# Patient Record
Sex: Female | Born: 1950 | Race: White | Hispanic: No | Marital: Married | Smoking: Current every day smoker
Health system: Southern US, Community
[De-identification: ages and names within clinical notes are randomized; demographics above are authoritative.]

## PROBLEM LIST (undated history)

## (undated) DIAGNOSIS — I2510 Atherosclerotic heart disease of native coronary artery without angina pectoris: Secondary | ICD-10-CM

## (undated) DIAGNOSIS — M549 Dorsalgia, unspecified: Secondary | ICD-10-CM

## (undated) DIAGNOSIS — F329 Major depressive disorder, single episode, unspecified: Secondary | ICD-10-CM

## (undated) DIAGNOSIS — F32A Depression, unspecified: Secondary | ICD-10-CM

## (undated) DIAGNOSIS — F419 Anxiety disorder, unspecified: Secondary | ICD-10-CM

## (undated) DIAGNOSIS — I219 Acute myocardial infarction, unspecified: Secondary | ICD-10-CM

## (undated) DIAGNOSIS — J449 Chronic obstructive pulmonary disease, unspecified: Secondary | ICD-10-CM

## (undated) DIAGNOSIS — E119 Type 2 diabetes mellitus without complications: Secondary | ICD-10-CM

## (undated) DIAGNOSIS — Z720 Tobacco use: Secondary | ICD-10-CM

## (undated) DIAGNOSIS — R112 Nausea with vomiting, unspecified: Secondary | ICD-10-CM

## (undated) DIAGNOSIS — E039 Hypothyroidism, unspecified: Secondary | ICD-10-CM

## (undated) DIAGNOSIS — I10 Essential (primary) hypertension: Secondary | ICD-10-CM

## (undated) DIAGNOSIS — T8859XA Other complications of anesthesia, initial encounter: Secondary | ICD-10-CM

## (undated) DIAGNOSIS — G8929 Other chronic pain: Secondary | ICD-10-CM

## (undated) DIAGNOSIS — M1990 Unspecified osteoarthritis, unspecified site: Secondary | ICD-10-CM

## (undated) DIAGNOSIS — T4145XA Adverse effect of unspecified anesthetic, initial encounter: Secondary | ICD-10-CM

## (undated) DIAGNOSIS — Z9889 Other specified postprocedural states: Secondary | ICD-10-CM

## (undated) HISTORY — PX: TONSILLECTOMY: SUR1361

## (undated) HISTORY — PX: SPINAL FUSION: SHX223

## (undated) HISTORY — PX: TUBAL LIGATION: SHX77

## (undated) HISTORY — PX: ORIF ANKLE FRACTURE: SHX5408

## (undated) HISTORY — PX: EXPLORATORY LAPAROTOMY: SUR591

## (undated) HISTORY — PX: APPENDECTOMY: SHX54

## (undated) HISTORY — PX: BACK SURGERY: SHX140

## (undated) HISTORY — PX: BREAST LUMPECTOMY: SHX2

## (undated) HISTORY — PX: ABDOMINAL HYSTERECTOMY: SHX81

## (undated) HISTORY — PX: CHOLECYSTECTOMY: SHX55

---

## 2012-07-08 ENCOUNTER — Encounter (INDEPENDENT_AMBULATORY_CARE_PROVIDER_SITE_OTHER): Payer: Self-pay | Admitting: *Deleted

## 2013-01-22 ENCOUNTER — Ambulatory Visit (HOSPITAL_COMMUNITY): Payer: BC Managed Care – PPO | Admitting: Anesthesiology

## 2013-01-22 ENCOUNTER — Inpatient Hospital Stay (HOSPITAL_COMMUNITY): Payer: BC Managed Care – PPO

## 2013-01-22 ENCOUNTER — Encounter (HOSPITAL_COMMUNITY): Payer: Self-pay | Admitting: Cardiology

## 2013-01-22 ENCOUNTER — Encounter (HOSPITAL_COMMUNITY): Payer: BC Managed Care – PPO | Admitting: Anesthesiology

## 2013-01-22 ENCOUNTER — Encounter (HOSPITAL_COMMUNITY)
Admission: EM | Disposition: A | Discharge status: Home / Self Care | Payer: Self-pay | Source: Other Acute Inpatient Hospital | Attending: Cardiothoracic Surgery

## 2013-01-22 ENCOUNTER — Inpatient Hospital Stay (HOSPITAL_COMMUNITY)
Admission: EM | Admit: 2013-01-22 | Discharge: 2013-01-29 | DRG: 233 | Disposition: A | Discharge status: Home / Self Care | Payer: BC Managed Care – PPO | Source: Other Acute Inpatient Hospital | Attending: Cardiothoracic Surgery | Admitting: Cardiothoracic Surgery

## 2013-01-22 ENCOUNTER — Encounter (HOSPITAL_COMMUNITY)
Admission: EM | Disposition: A | Discharge status: Home / Self Care | Payer: BC Managed Care – PPO | Source: Other Acute Inpatient Hospital | Attending: Cardiothoracic Surgery

## 2013-01-22 DIAGNOSIS — F17200 Nicotine dependence, unspecified, uncomplicated: Secondary | ICD-10-CM

## 2013-01-22 DIAGNOSIS — I255 Ischemic cardiomyopathy: Secondary | ICD-10-CM | POA: Diagnosis present

## 2013-01-22 DIAGNOSIS — I2510 Atherosclerotic heart disease of native coronary artery without angina pectoris: Secondary | ICD-10-CM

## 2013-01-22 DIAGNOSIS — I10 Essential (primary) hypertension: Secondary | ICD-10-CM | POA: Diagnosis present

## 2013-01-22 DIAGNOSIS — E785 Hyperlipidemia, unspecified: Secondary | ICD-10-CM | POA: Diagnosis present

## 2013-01-22 DIAGNOSIS — E119 Type 2 diabetes mellitus without complications: Secondary | ICD-10-CM | POA: Diagnosis present

## 2013-01-22 DIAGNOSIS — D696 Thrombocytopenia, unspecified: Secondary | ICD-10-CM | POA: Diagnosis not present

## 2013-01-22 DIAGNOSIS — I2589 Other forms of chronic ischemic heart disease: Secondary | ICD-10-CM | POA: Diagnosis present

## 2013-01-22 DIAGNOSIS — Z720 Tobacco use: Secondary | ICD-10-CM | POA: Diagnosis present

## 2013-01-22 DIAGNOSIS — J449 Chronic obstructive pulmonary disease, unspecified: Secondary | ICD-10-CM | POA: Diagnosis present

## 2013-01-22 DIAGNOSIS — I213 ST elevation (STEMI) myocardial infarction of unspecified site: Secondary | ICD-10-CM

## 2013-01-22 DIAGNOSIS — R578 Other shock: Secondary | ICD-10-CM | POA: Diagnosis not present

## 2013-01-22 DIAGNOSIS — I2109 ST elevation (STEMI) myocardial infarction involving other coronary artery of anterior wall: Principal | ICD-10-CM | POA: Diagnosis present

## 2013-01-22 DIAGNOSIS — I2582 Chronic total occlusion of coronary artery: Secondary | ICD-10-CM | POA: Diagnosis present

## 2013-01-22 DIAGNOSIS — E8779 Other fluid overload: Secondary | ICD-10-CM | POA: Diagnosis not present

## 2013-01-22 DIAGNOSIS — I2119 ST elevation (STEMI) myocardial infarction involving other coronary artery of inferior wall: Secondary | ICD-10-CM

## 2013-01-22 DIAGNOSIS — D62 Acute posthemorrhagic anemia: Secondary | ICD-10-CM | POA: Diagnosis not present

## 2013-01-22 DIAGNOSIS — I219 Acute myocardial infarction, unspecified: Secondary | ICD-10-CM

## 2013-01-22 DIAGNOSIS — Z951 Presence of aortocoronary bypass graft: Secondary | ICD-10-CM

## 2013-01-22 DIAGNOSIS — K5900 Constipation, unspecified: Secondary | ICD-10-CM | POA: Diagnosis not present

## 2013-01-22 DIAGNOSIS — R579 Shock, unspecified: Secondary | ICD-10-CM

## 2013-01-22 DIAGNOSIS — Z79899 Other long term (current) drug therapy: Secondary | ICD-10-CM

## 2013-01-22 DIAGNOSIS — M549 Dorsalgia, unspecified: Secondary | ICD-10-CM

## 2013-01-22 DIAGNOSIS — I739 Peripheral vascular disease, unspecified: Secondary | ICD-10-CM

## 2013-01-22 DIAGNOSIS — J4489 Other specified chronic obstructive pulmonary disease: Secondary | ICD-10-CM | POA: Diagnosis present

## 2013-01-22 HISTORY — PX: CORONARY ARTERY BYPASS GRAFT: SHX141

## 2013-01-22 HISTORY — DX: Anxiety disorder, unspecified: F41.9

## 2013-01-22 HISTORY — DX: Major depressive disorder, single episode, unspecified: F32.9

## 2013-01-22 HISTORY — DX: Acute myocardial infarction, unspecified: I21.9

## 2013-01-22 HISTORY — DX: Depression, unspecified: F32.A

## 2013-01-22 HISTORY — DX: Adverse effect of unspecified anesthetic, initial encounter: T41.45XA

## 2013-01-22 HISTORY — PX: LEFT HEART CATHETERIZATION WITH CORONARY ANGIOGRAM: SHX5451

## 2013-01-22 HISTORY — DX: Other complications of anesthesia, initial encounter: T88.59XA

## 2013-01-22 LAB — POCT I-STAT 3, ART BLOOD GAS (G3+)
Acid-base deficit: 3 mmol/L — ABNORMAL HIGH (ref 0.0–2.0)
Bicarbonate: 20.9 mEq/L (ref 20.0–24.0)
Bicarbonate: 20.6 mEq/L (ref 20.0–24.0)
O2 Saturation: 100 %
O2 Saturation: 97 %
TCO2: 28 mmol/L (ref 0–100)
TCO2: 22 mmol/L (ref 0–100)
TCO2: 22 mmol/L (ref 0–100)
pCO2 arterial: 37.4 mmHg (ref 35.0–45.0)
pCO2 arterial: 50.8 mmHg — ABNORMAL HIGH (ref 35.0–45.0)
pCO2 arterial: 39.3 mmHg (ref 35.0–45.0)
pH, Arterial: 7.356 (ref 7.350–7.450)
pH, Arterial: 7.283 — ABNORMAL LOW (ref 7.350–7.450)
pO2, Arterial: 481 mmHg — ABNORMAL HIGH (ref 80.0–100.0)
pO2, Arterial: 326 mmHg — ABNORMAL HIGH (ref 80.0–100.0)

## 2013-01-22 LAB — POCT I-STAT 4, (NA,K, GLUC, HGB,HCT)
Glucose, Bld: 168 mg/dL — ABNORMAL HIGH (ref 70–99)
Glucose, Bld: 150 mg/dL — ABNORMAL HIGH (ref 70–99)
Glucose, Bld: 102 mg/dL — ABNORMAL HIGH (ref 70–99)
Glucose, Bld: 158 mg/dL — ABNORMAL HIGH (ref 70–99)
HCT: 43 % (ref 36.0–46.0)
HCT: 33 % — ABNORMAL LOW (ref 36.0–46.0)
HCT: 21 % — ABNORMAL LOW (ref 36.0–46.0)
HCT: 26 % — ABNORMAL LOW (ref 36.0–46.0)
HCT: 27 % — ABNORMAL LOW (ref 36.0–46.0)
HCT: 36 % (ref 36.0–46.0)
Hemoglobin: 14.6 g/dL (ref 12.0–15.0)
Hemoglobin: 7.1 g/dL — ABNORMAL LOW (ref 12.0–15.0)
Hemoglobin: 8.8 g/dL — ABNORMAL LOW (ref 12.0–15.0)
Hemoglobin: 8.8 g/dL — ABNORMAL LOW (ref 12.0–15.0)
Hemoglobin: 9.2 g/dL — ABNORMAL LOW (ref 12.0–15.0)
Hemoglobin: 12.2 g/dL (ref 12.0–15.0)
Potassium: 4.2 mEq/L (ref 3.5–5.1)
Potassium: 3.5 mEq/L (ref 3.5–5.1)
Potassium: 4.3 mEq/L (ref 3.5–5.1)
Potassium: 5 mEq/L (ref 3.5–5.1)
Potassium: 3.3 mEq/L — ABNORMAL LOW (ref 3.5–5.1)
Sodium: 137 mEq/L (ref 135–145)
Sodium: 139 mEq/L (ref 135–145)
Sodium: 133 mEq/L — ABNORMAL LOW (ref 135–145)
Sodium: 139 mEq/L (ref 135–145)

## 2013-01-22 LAB — COMPREHENSIVE METABOLIC PANEL
ALT: 9 U/L (ref 0–35)
AST: 15 U/L (ref 0–37)
Albumin: 2.9 g/dL — ABNORMAL LOW (ref 3.5–5.2)
Alkaline Phosphatase: 69 U/L (ref 39–117)
Calcium: 7.7 mg/dL — ABNORMAL LOW (ref 8.4–10.5)
GFR calc Af Amer: >90 mL/min (ref >90)
Glucose, Bld: 200 mg/dL — ABNORMAL HIGH (ref 70–99)
Potassium: 3.9 mEq/L (ref 3.5–5.1)
Sodium: 130 mEq/L — ABNORMAL LOW (ref 135–145)
Total Protein: 5.7 g/dL — ABNORMAL LOW (ref 6.0–8.3)

## 2013-01-22 LAB — TROPONIN I: Troponin I: 1.62 ng/mL (ref <0.30)

## 2013-01-22 LAB — CBC
HCT: 36 % (ref 36.0–46.0)
Hemoglobin: 12.7 g/dL (ref 12.0–15.0)
MCH: 30.5 pg (ref 26.0–34.0)
MCH: 29.9 pg (ref 26.0–34.0)
MCHC: 33.7 g/dL (ref 30.0–36.0)
MCHC: 33.9 g/dL (ref 30.0–36.0)
MCV: 88.2 fL (ref 78.0–100.0)
Platelets: 246 10*3/uL (ref 150–400)
RBC: 4.08 MIL/uL (ref 3.87–5.11)
RDW: 14.4 % (ref 11.5–15.5)

## 2013-01-22 LAB — LIPID PANEL
Cholesterol: 273 mg/dL — ABNORMAL HIGH (ref 0–200)
HDL: 32 mg/dL — ABNORMAL LOW (ref >39)
LDL Cholesterol: 202 mg/dL — ABNORMAL HIGH (ref 0–99)
Total CHOL/HDL Ratio: 8.5 RATIO
Triglycerides: 196 mg/dL — ABNORMAL HIGH (ref <150)
VLDL: 39 mg/dL (ref 0–40)

## 2013-01-22 LAB — CK TOTAL AND CKMB (NOT AT ARMC)
CK, MB: 11 ng/mL (ref 0.3–4.0)
Relative Index: 10.9 — ABNORMAL HIGH (ref 0.0–2.5)

## 2013-01-22 LAB — GLUCOSE, CAPILLARY: Glucose-Capillary: 196 mg/dL — ABNORMAL HIGH (ref 70–99)

## 2013-01-22 LAB — PLATELET COUNT: Platelets: 106 10*3/uL — ABNORMAL LOW (ref 150–400)

## 2013-01-22 LAB — HEMOGLOBIN AND HEMATOCRIT, BLOOD
HCT: 24.6 % — ABNORMAL LOW (ref 36.0–46.0)
Hemoglobin: 8.6 g/dL — ABNORMAL LOW (ref 12.0–15.0)

## 2013-01-22 LAB — ABO/RH: ABO/RH(D): O POS

## 2013-01-22 SURGERY — CORONARY ARTERY BYPASS GRAFTING (CABG)
Anesthesia: General | Site: Chest

## 2013-01-22 SURGERY — LEFT HEART CATHETERIZATION WITH CORONARY ANGIOGRAM
Laterality: Bilateral

## 2013-01-22 MED ORDER — EPINEPHRINE HCL 1 MG/ML IJ SOLN
0.5000 ug/min | INTRAMUSCULAR | Status: DC
  Filled 2013-01-22: qty 4

## 2013-01-22 MED ORDER — PLASMA-LYTE 148 IV SOLN
INTRAVENOUS | Status: DC
  Filled 2013-01-22: qty 2.5

## 2013-01-22 MED ORDER — DOPAMINE-DEXTROSE 3.2-5 MG/ML-% IV SOLN: 2.0000 ug/kg/min | INTRAVENOUS | Status: DC

## 2013-01-22 MED ORDER — DEXMEDETOMIDINE HCL IN NACL 400 MCG/100ML IV SOLN
0.1000 ug/kg/h | INTRAVENOUS | Status: DC
  Filled 2013-01-22: qty 100

## 2013-01-22 MED ORDER — MAGNESIUM SULFATE 40 MG/ML IJ SOLN
4.0000 g | Freq: Once | INTRAMUSCULAR | Status: AC
  Administered 2013-01-22: 4 g via INTRAVENOUS
  Filled 2013-01-22: qty 100

## 2013-01-22 MED ORDER — SODIUM CHLORIDE 0.9 % IV SOLN
10.0000 g | INTRAVENOUS | Status: DC | PRN
  Administered 2013-01-22: 5 g/h via INTRAVENOUS

## 2013-01-22 MED ORDER — PHENYLEPHRINE HCL 10 MG/ML IJ SOLN
30.0000 ug/min | INTRAVENOUS | Status: DC
  Administered 2013-01-22: 50 ug/min via INTRAVENOUS
  Filled 2013-01-22: qty 2

## 2013-01-22 MED ORDER — SODIUM CHLORIDE 0.9 % IV SOLN
INTRAVENOUS | Status: DC
  Filled 2013-01-22: qty 1

## 2013-01-22 MED ORDER — METOPROLOL TARTRATE 12.5 MG HALF TABLET: 12.5000 mg | ORAL_TABLET | Freq: Once | ORAL | Status: DC

## 2013-01-22 MED ORDER — SUCCINYLCHOLINE CHLORIDE 20 MG/ML IJ SOLN
INTRAMUSCULAR | Status: DC | PRN
  Administered 2013-01-22: 100 mg via INTRAVENOUS

## 2013-01-22 MED ORDER — FENTANYL CITRATE 0.05 MG/ML IJ SOLN
INTRAMUSCULAR | Status: DC | PRN
  Administered 2013-01-22 (×2): 250 ug via INTRAVENOUS
  Administered 2013-01-22: 200 ug via INTRAVENOUS
  Administered 2013-01-22: 250 ug via INTRAVENOUS
  Administered 2013-01-22: 50 ug via INTRAVENOUS

## 2013-01-22 MED ORDER — ACETAMINOPHEN 160 MG/5ML PO SOLN
1000.0000 mg | Freq: Four times a day (QID) | ORAL | Status: DC
  Administered 2013-01-23 – 2013-01-24 (×4): 1000 mg
  Filled 2013-01-22 (×3): qty 40.6

## 2013-01-22 MED ORDER — NITROGLYCERIN IN D5W 200-5 MCG/ML-% IV SOLN
0.0000 ug/min | INTRAVENOUS | Status: DC
  Administered 2013-01-22: 5 ug/min via INTRAVENOUS

## 2013-01-22 MED ORDER — PROTAMINE SULFATE 10 MG/ML IV SOLN
INTRAVENOUS | Status: DC | PRN
  Administered 2013-01-22: 290 mg via INTRAVENOUS
  Administered 2013-01-22: 10 mg via INTRAVENOUS

## 2013-01-22 MED ORDER — POTASSIUM CHLORIDE 10 MEQ/50ML IV SOLN
10.0000 meq | INTRAVENOUS | Status: AC
  Administered 2013-01-22 – 2013-01-23 (×3): 10 meq via INTRAVENOUS

## 2013-01-22 MED ORDER — PHENYLEPHRINE HCL 10 MG/ML IJ SOLN
30.0000 ug/min | INTRAVENOUS | Status: DC
  Filled 2013-01-22: qty 2

## 2013-01-22 MED ORDER — SODIUM CHLORIDE 0.9 % IV SOLN
INTRAVENOUS | Status: DC
  Filled 2013-01-22: qty 30

## 2013-01-22 MED ORDER — MAGNESIUM SULFATE 50 % IJ SOLN
40.0000 meq | INTRAMUSCULAR | Status: DC
  Filled 2013-01-22: qty 10

## 2013-01-22 MED ORDER — ONDANSETRON HCL 4 MG/2ML IJ SOLN
4.0000 mg | Freq: Four times a day (QID) | INTRAMUSCULAR | Status: DC | PRN
  Administered 2013-01-24 (×2): 4 mg via INTRAVENOUS
  Filled 2013-01-22 (×3): qty 2

## 2013-01-22 MED ORDER — DEXMEDETOMIDINE HCL IN NACL 200 MCG/50ML IV SOLN
0.1000 ug/kg/h | INTRAVENOUS | Status: DC
  Administered 2013-01-22 – 2013-01-24 (×8): 0.7 ug/kg/h via INTRAVENOUS
  Administered 2013-01-24: 0.5 ug/kg/h via INTRAVENOUS
  Filled 2013-01-22 (×8): qty 50

## 2013-01-22 MED ORDER — PANTOPRAZOLE SODIUM 40 MG PO TBEC
40.0000 mg | DELAYED_RELEASE_TABLET | Freq: Every day | ORAL | Status: DC
  Administered 2013-01-24 – 2013-01-27 (×4): 40 mg via ORAL
  Filled 2013-01-22 (×4): qty 1

## 2013-01-22 MED ORDER — SODIUM CHLORIDE 0.9 % IV SOLN
INTRAVENOUS | Status: DC
  Administered 2013-01-22: 23:00:00 via INTRAVENOUS

## 2013-01-22 MED ORDER — BISACODYL 5 MG PO TBEC: 5.0000 mg | DELAYED_RELEASE_TABLET | Freq: Once | ORAL | Status: DC

## 2013-01-22 MED ORDER — VANCOMYCIN HCL 10 G IV SOLR
1250.0000 mg | INTRAVENOUS | Status: DC
  Filled 2013-01-22: qty 1250

## 2013-01-22 MED ORDER — ROCURONIUM BROMIDE 100 MG/10ML IV SOLN
INTRAVENOUS | Status: DC | PRN
  Administered 2013-01-22: 50 mg via INTRAVENOUS

## 2013-01-22 MED ORDER — SODIUM CHLORIDE 0.9 % IV SOLN
INTRAVENOUS | Status: DC
  Filled 2013-01-22: qty 40

## 2013-01-22 MED ORDER — PHENYLEPHRINE HCL 10 MG/ML IJ SOLN
0.0000 ug/min | INTRAMUSCULAR | Status: DC
  Administered 2013-01-22: 50 ug/min via INTRAVENOUS
  Administered 2013-01-23: 45 ug/min via INTRAVENOUS
  Administered 2013-01-23: 55 ug/min via INTRAVENOUS
  Filled 2013-01-22 (×5): qty 2

## 2013-01-22 MED ORDER — SODIUM CHLORIDE 0.9 % IJ SOLN
3.0000 mL | Freq: Two times a day (BID) | INTRAMUSCULAR | Status: DC
  Administered 2013-01-23: 10:00:00 via INTRAVENOUS
  Administered 2013-01-23 – 2013-01-27 (×8): 3 mL via INTRAVENOUS

## 2013-01-22 MED ORDER — ETOMIDATE 2 MG/ML IV SOLN
INTRAVENOUS | Status: DC | PRN
  Administered 2013-01-22: 12 mg via INTRAVENOUS

## 2013-01-22 MED ORDER — DEXTROSE 5 % IV SOLN
1.5000 g | INTRAVENOUS | Status: DC
  Filled 2013-01-22: qty 1.5

## 2013-01-22 MED ORDER — SODIUM CHLORIDE 0.9 % IJ SOLN
OROMUCOSAL | Status: DC | PRN
  Administered 2013-01-22 (×3): via TOPICAL

## 2013-01-22 MED ORDER — INSULIN REGULAR BOLUS VIA INFUSION
0.0000 [IU] | Freq: Three times a day (TID) | INTRAVENOUS | Status: DC
  Filled 2013-01-22: qty 10

## 2013-01-22 MED ORDER — SODIUM CHLORIDE 0.9 % IV SOLN: 250.0000 mL | INTRAVENOUS | Status: DC

## 2013-01-22 MED ORDER — ALBUMIN HUMAN 5 % IV SOLN
250.0000 mL | INTRAVENOUS | Status: AC | PRN
  Administered 2013-01-22 – 2013-01-23 (×2): 250 mL via INTRAVENOUS

## 2013-01-22 MED ORDER — LACTATED RINGERS IV SOLN
INTRAVENOUS | Status: DC | PRN
  Administered 2013-01-22: 14:00:00 via INTRAVENOUS

## 2013-01-22 MED ORDER — NITROGLYCERIN IN D5W 200-5 MCG/ML-% IV SOLN
2.0000 ug/min | INTRAVENOUS | Status: DC
  Administered 2013-01-22: 5 ug/min via INTRAVENOUS

## 2013-01-22 MED ORDER — 0.9 % SODIUM CHLORIDE (POUR BTL) OPTIME
TOPICAL | Status: DC | PRN
  Administered 2013-01-22: 6000 mL

## 2013-01-22 MED ORDER — MORPHINE SULFATE 2 MG/ML IJ SOLN
1.0000 mg | INTRAMUSCULAR | Status: AC | PRN
  Administered 2013-01-23 (×2): 2 mg via INTRAVENOUS
  Filled 2013-01-22: qty 2

## 2013-01-22 MED ORDER — LIDOCAINE HCL (CARDIAC) 20 MG/ML IV SOLN
INTRAVENOUS | Status: DC | PRN
  Administered 2013-01-22: 40 mg via INTRAVENOUS

## 2013-01-22 MED ORDER — POTASSIUM CHLORIDE 2 MEQ/ML IV SOLN
80.0000 meq | INTRAVENOUS | Status: DC
  Filled 2013-01-22: qty 40

## 2013-01-22 MED ORDER — ALBUTEROL SULFATE HFA 108 (90 BASE) MCG/ACT IN AERS
INHALATION_SPRAY | RESPIRATORY_TRACT | Status: DC | PRN
  Administered 2013-01-22: 2 via RESPIRATORY_TRACT

## 2013-01-22 MED ORDER — METOPROLOL TARTRATE 12.5 MG HALF TABLET
12.5000 mg | ORAL_TABLET | Freq: Two times a day (BID) | ORAL | Status: DC
  Administered 2013-01-24: 12.5 mg via ORAL
  Filled 2013-01-22 (×5): qty 1

## 2013-01-22 MED ORDER — LACTATED RINGERS IV SOLN
INTRAVENOUS | Status: DC
  Administered 2013-01-22: 20 mL/h via INTRAVENOUS
  Administered 2013-01-25: via INTRAVENOUS

## 2013-01-22 MED ORDER — MILRINONE IN DEXTROSE 20 MG/100ML IV SOLN
0.3000 ug/kg/min | INTRAVENOUS | Status: DC
Start: 2013-01-22 — End: 2013-01-22

## 2013-01-22 MED ORDER — METOPROLOL TARTRATE 25 MG/10 ML ORAL SUSPENSION
12.5000 mg | Freq: Two times a day (BID) | ORAL | Status: DC
  Filled 2013-01-22 (×5): qty 5

## 2013-01-22 MED ORDER — FAMOTIDINE IN NACL 20-0.9 MG/50ML-% IV SOLN
20.0000 mg | Freq: Two times a day (BID) | INTRAVENOUS | Status: AC
  Administered 2013-01-22 – 2013-01-23 (×2): 20 mg via INTRAVENOUS
  Filled 2013-01-22: qty 50

## 2013-01-22 MED ORDER — ACETAMINOPHEN 650 MG RE SUPP: 650.0000 mg | Freq: Once | RECTAL | Status: AC

## 2013-01-22 MED ORDER — SODIUM CHLORIDE 0.9 % IV SOLN
INTRAVENOUS | Status: DC | PRN
  Administered 2013-01-22: 17:00:00 via INTRAVENOUS

## 2013-01-22 MED ORDER — ARTIFICIAL TEARS OP OINT
TOPICAL_OINTMENT | OPHTHALMIC | Status: DC | PRN
  Administered 2013-01-22: 1 via OPHTHALMIC

## 2013-01-22 MED ORDER — EPINEPHRINE HCL 1 MG/ML IJ SOLN
0.5000 ug/min | INTRAVENOUS | Status: DC
  Filled 2013-01-22: qty 4

## 2013-01-22 MED ORDER — DOPAMINE-DEXTROSE 3.2-5 MG/ML-% IV SOLN
2.5000 ug/kg/min | INTRAVENOUS | Status: DC
  Administered 2013-01-22: 5 ug/kg/min via INTRAVENOUS
  Administered 2013-01-24: 4 ug/kg/min via INTRAVENOUS
  Filled 2013-01-22: qty 250

## 2013-01-22 MED ORDER — DOPAMINE-DEXTROSE 3.2-5 MG/ML-% IV SOLN
2.0000 ug/kg/min | INTRAVENOUS | Status: DC
  Administered 2013-01-22: 3 ug/kg/min via INTRAVENOUS
  Filled 2013-01-22: qty 250

## 2013-01-22 MED ORDER — HEMOSTATIC AGENTS (NO CHARGE) OPTIME
TOPICAL | Status: DC | PRN
  Administered 2013-01-22: 1 via TOPICAL

## 2013-01-22 MED ORDER — ASPIRIN 81 MG PO CHEW
324.0000 mg | CHEWABLE_TABLET | Freq: Every day | ORAL | Status: DC
  Administered 2013-01-23: 324 mg
  Filled 2013-01-22: qty 4

## 2013-01-22 MED ORDER — LACTATED RINGERS IV SOLN
INTRAVENOUS | Status: DC | PRN
  Administered 2013-01-22 (×2): via INTRAVENOUS

## 2013-01-22 MED ORDER — SODIUM CHLORIDE 0.9 % IV SOLN
INTRAVENOUS | Status: DC
  Administered 2013-01-22: 3.2 [IU]/h via INTRAVENOUS
  Filled 2013-01-22: qty 1

## 2013-01-22 MED ORDER — DEXTROSE 5 % IV SOLN
750.0000 mg | INTRAVENOUS | Status: DC
  Filled 2013-01-22 (×2): qty 750

## 2013-01-22 MED ORDER — LACTATED RINGERS IV SOLN: 500.0000 mL | Freq: Once | INTRAVENOUS | Status: AC | PRN

## 2013-01-22 MED ORDER — EPHEDRINE SULFATE 50 MG/ML IJ SOLN
INTRAMUSCULAR | Status: DC | PRN
  Administered 2013-01-22: 10 mg via INTRAVENOUS

## 2013-01-22 MED ORDER — TEMAZEPAM 15 MG PO CAPS: 15.0000 mg | ORAL_CAPSULE | Freq: Once | ORAL | Status: DC | PRN

## 2013-01-22 MED ORDER — NITROGLYCERIN IN D5W 200-5 MCG/ML-% IV SOLN
2.0000 ug/min | INTRAVENOUS | Status: DC
  Filled 2013-01-22: qty 250

## 2013-01-22 MED ORDER — PLASMA-LYTE 148 IV SOLN
INTRAVENOUS | Status: DC | PRN
  Administered 2013-01-22: 16:00:00 via INTRAVASCULAR

## 2013-01-22 MED ORDER — VECURONIUM BROMIDE 10 MG IV SOLR
INTRAVENOUS | Status: DC | PRN
  Administered 2013-01-22 (×3): 5 mg via INTRAVENOUS

## 2013-01-22 MED ORDER — LEVALBUTEROL HCL 0.63 MG/3ML IN NEBU
0.6300 mg | INHALATION_SOLUTION | Freq: Four times a day (QID) | RESPIRATORY_TRACT | Status: DC
  Administered 2013-01-22 – 2013-01-25 (×13): 0.63 mg via RESPIRATORY_TRACT
  Filled 2013-01-22 (×20): qty 3

## 2013-01-22 MED ORDER — DOCUSATE SODIUM 100 MG PO CAPS
200.0000 mg | ORAL_CAPSULE | Freq: Every day | ORAL | Status: DC
  Administered 2013-01-24 – 2013-01-27 (×4): 200 mg via ORAL
  Filled 2013-01-22 (×4): qty 2

## 2013-01-22 MED ORDER — HEPARIN SODIUM (PORCINE) 1000 UNIT/ML IJ SOLN
INTRAMUSCULAR | Status: DC | PRN
  Administered 2013-01-22: 20000 [IU] via INTRAVENOUS

## 2013-01-22 MED ORDER — MILRINONE IN DEXTROSE 20 MG/100ML IV SOLN
0.1250 ug/kg/min | INTRAVENOUS | Status: AC
  Administered 2013-01-22 – 2013-01-23 (×2): 0.3 ug/kg/min via INTRAVENOUS
  Filled 2013-01-22: qty 100

## 2013-01-22 MED ORDER — MORPHINE SULFATE 2 MG/ML IJ SOLN
2.0000 mg | INTRAMUSCULAR | Status: DC | PRN
  Administered 2013-01-23: 4 mg via INTRAVENOUS
  Administered 2013-01-23: 2 mg via INTRAVENOUS
  Administered 2013-01-23 (×2): 4 mg via INTRAVENOUS
  Administered 2013-01-23 (×2): 2 mg via INTRAVENOUS
  Administered 2013-01-23 (×2): 4 mg via INTRAVENOUS
  Administered 2013-01-23 – 2013-01-24 (×3): 2 mg via INTRAVENOUS
  Administered 2013-01-24: 4 mg via INTRAVENOUS
  Administered 2013-01-24 (×2): 2 mg via INTRAVENOUS
  Administered 2013-01-24: 4 mg via INTRAVENOUS
  Filled 2013-01-22 (×6): qty 2
  Filled 2013-01-22: qty 1
  Filled 2013-01-22: qty 2
  Filled 2013-01-22 (×2): qty 1
  Filled 2013-01-22 (×2): qty 2
  Filled 2013-01-22 (×2): qty 1

## 2013-01-22 MED ORDER — BISACODYL 5 MG PO TBEC
10.0000 mg | DELAYED_RELEASE_TABLET | Freq: Every day | ORAL | Status: DC
  Administered 2013-01-24 – 2013-01-27 (×3): 10 mg via ORAL
  Filled 2013-01-22 (×3): qty 2

## 2013-01-22 MED ORDER — DEXTROSE 5 % IV SOLN
1.5000 g | INTRAVENOUS | Status: DC
  Administered 2013-01-22: 1.5 g via INTRAVENOUS
  Administered 2013-01-22: .75 g via INTRAVENOUS
  Filled 2013-01-22: qty 1.5

## 2013-01-22 MED ORDER — ALBUMIN HUMAN 5 % IV SOLN
INTRAVENOUS | Status: DC | PRN
  Administered 2013-01-22 (×2): via INTRAVENOUS

## 2013-01-22 MED ORDER — ACETAMINOPHEN 160 MG/5ML PO SOLN
650.0000 mg | Freq: Once | ORAL | Status: AC
  Administered 2013-01-22: 650 mg
  Filled 2013-01-22: qty 20.3

## 2013-01-22 MED ORDER — SODIUM CHLORIDE 0.45 % IV SOLN
INTRAVENOUS | Status: DC
  Administered 2013-01-22: 23:00:00 via INTRAVENOUS

## 2013-01-22 MED ORDER — DEXMEDETOMIDINE HCL IN NACL 200 MCG/50ML IV SOLN
INTRAVENOUS | Status: DC | PRN
  Administered 2013-01-22: 0.5 ug/kg/h via INTRAVENOUS
  Administered 2013-01-22: 0.2 ug/kg/h via INTRAVENOUS

## 2013-01-22 MED ORDER — DEXTROSE 5 % IV SOLN
1.5000 g | Freq: Two times a day (BID) | INTRAVENOUS | Status: AC
  Administered 2013-01-23 – 2013-01-24 (×4): 1.5 g via INTRAVENOUS
  Filled 2013-01-22 (×4): qty 1.5

## 2013-01-22 MED ORDER — ASPIRIN EC 325 MG PO TBEC
325.0000 mg | DELAYED_RELEASE_TABLET | Freq: Every day | ORAL | Status: DC
  Administered 2013-01-24 – 2013-01-27 (×4): 325 mg via ORAL
  Filled 2013-01-22 (×5): qty 1

## 2013-01-22 MED ORDER — MILRINONE IN DEXTROSE 20 MG/100ML IV SOLN
0.1250 ug/kg/min | INTRAVENOUS | Status: AC
  Administered 2013-01-22: .3 ug/kg/min via INTRAVENOUS
  Filled 2013-01-22: qty 100

## 2013-01-22 MED ORDER — VANCOMYCIN HCL 10 G IV SOLR
1250.0000 mg | INTRAVENOUS | Status: DC
  Administered 2013-01-22: 1250 mg via INTRAVENOUS
  Filled 2013-01-22: qty 1250

## 2013-01-22 MED ORDER — MIDAZOLAM HCL 5 MG/5ML IJ SOLN
INTRAMUSCULAR | Status: DC | PRN
  Administered 2013-01-22: 1 mg via INTRAVENOUS
  Administered 2013-01-22 (×3): 2 mg via INTRAVENOUS
  Administered 2013-01-22: 3 mg via INTRAVENOUS

## 2013-01-22 MED ORDER — MIDAZOLAM HCL 2 MG/2ML IJ SOLN
2.0000 mg | INTRAMUSCULAR | Status: DC | PRN
  Administered 2013-01-23 – 2013-01-24 (×5): 2 mg via INTRAVENOUS
  Filled 2013-01-22 (×5): qty 2

## 2013-01-22 MED ORDER — OXYCODONE HCL 5 MG PO TABS
5.0000 mg | ORAL_TABLET | ORAL | Status: DC | PRN
  Administered 2013-01-24 – 2013-01-25 (×5): 10 mg via ORAL
  Administered 2013-01-25: 5 mg via ORAL
  Administered 2013-01-25 – 2013-01-27 (×9): 10 mg via ORAL
  Filled 2013-01-22 (×15): qty 2

## 2013-01-22 MED ORDER — ACETAMINOPHEN 500 MG PO TABS
1000.0000 mg | ORAL_TABLET | Freq: Four times a day (QID) | ORAL | Status: DC
  Administered 2013-01-24 – 2013-01-27 (×9): 1000 mg via ORAL
  Filled 2013-01-22 (×19): qty 2

## 2013-01-22 MED ORDER — SODIUM CHLORIDE 0.9 % IJ SOLN: 3.0000 mL | INTRAMUSCULAR | Status: DC | PRN

## 2013-01-22 MED ORDER — SODIUM CHLORIDE 0.9 % IV SOLN
INTRAVENOUS | Status: DC
  Administered 2013-01-22: 9.5 [IU]/h via INTRAVENOUS
  Administered 2013-01-23: 2.3 [IU]/h via INTRAVENOUS
  Filled 2013-01-22 (×2): qty 1

## 2013-01-22 MED ORDER — BISACODYL 10 MG RE SUPP
10.0000 mg | Freq: Every day | RECTAL | Status: DC
  Administered 2013-01-23: 10 mg via RECTAL
  Filled 2013-01-22: qty 1

## 2013-01-22 MED ORDER — METOPROLOL TARTRATE 1 MG/ML IV SOLN: 2.5000 mg | INTRAVENOUS | Status: DC | PRN

## 2013-01-22 MED ORDER — VANCOMYCIN HCL IN DEXTROSE 1-5 GM/200ML-% IV SOLN
1000.0000 mg | Freq: Once | INTRAVENOUS | Status: AC
  Administered 2013-01-23: 1000 mg via INTRAVENOUS
  Filled 2013-01-22: qty 200

## 2013-01-22 MED ORDER — DEXTROSE 5 % IV SOLN
750.0000 mg | INTRAVENOUS | Status: DC
  Filled 2013-01-22: qty 750

## 2013-01-22 MED ORDER — NITROGLYCERIN IN D5W 200-5 MCG/ML-% IV SOLN: 2.0000 ug/min | INTRAVENOUS | Status: DC

## 2013-01-22 MED ORDER — PHENYLEPHRINE HCL 10 MG/ML IJ SOLN
10.0000 mg | INTRAVENOUS | Status: DC | PRN
  Administered 2013-01-22: 25 ug/min via INTRAVENOUS

## 2013-01-22 SURGICAL SUPPLY — 86 items
ATTRACTOMAT 16X20 MAGNETIC DRP (DRAPES) ×2 IMPLANT
BAG DECANTER FOR FLEXI CONT (MISCELLANEOUS) ×2 IMPLANT
BANDAGE ELASTIC 4 VELCRO ST LF (GAUZE/BANDAGES/DRESSINGS) ×2 IMPLANT
BANDAGE ELASTIC 6 VELCRO ST LF (GAUZE/BANDAGES/DRESSINGS) ×2 IMPLANT
BANDAGE GAUZE ELAST BULKY 4 IN (GAUZE/BANDAGES/DRESSINGS) ×2 IMPLANT
BLADE STERNUM SYSTEM 6 (BLADE) ×2 IMPLANT
BLADE SURG ROTATE 9660 (MISCELLANEOUS) ×2 IMPLANT
CABLE PACING FASLOC BIEGE (MISCELLANEOUS) ×2 IMPLANT
CANISTER SUCTION 2500CC (MISCELLANEOUS) ×2 IMPLANT
CANN PRFSN .5XCNCT 15X34-48 (MISCELLANEOUS) ×1
CANNULA AORTIC HI-FLOW 6.5M20F (CANNULA) ×2 IMPLANT
CANNULA PRFSN .5XCNCT 15X34-48 (MISCELLANEOUS) ×1 IMPLANT
CANNULA VEN 2 STAGE (MISCELLANEOUS) ×3 IMPLANT
CANNULA VESSEL 3MM 2 BLNT TIP (CANNULA) ×6 IMPLANT
CATH CPB KIT GERHARDT (MISCELLANEOUS) ×2 IMPLANT
CATH THORACIC 28FR (CATHETERS) ×2 IMPLANT
COVER SURGICAL LIGHT HANDLE (MISCELLANEOUS) ×2 IMPLANT
COVER TRANSDUCER ULTRASND GEL (DRAPE) ×2 IMPLANT
CRADLE DONUT ADULT HEAD (MISCELLANEOUS) ×2 IMPLANT
DRAIN CHANNEL 28F RND 3/8 FF (WOUND CARE) ×2 IMPLANT
DRAPE CARDIOVASCULAR INCISE (DRAPES) ×1
DRAPE PROXIMA HALF (DRAPES) ×2 IMPLANT
DRAPE SLUSH/WARMER DISC (DRAPES) ×2 IMPLANT
DRAPE SRG 135X102X78XABS (DRAPES) ×1 IMPLANT
DRSG AQUACEL AG ADV 3.5X14 (GAUZE/BANDAGES/DRESSINGS) ×4 IMPLANT
ELECT BLADE 4.0 EZ CLEAN MEGAD (MISCELLANEOUS) ×2
ELECT REM PT RETURN 9FT ADLT (ELECTROSURGICAL) ×4
ELECTRODE BLDE 4.0 EZ CLN MEGD (MISCELLANEOUS) ×1 IMPLANT
ELECTRODE REM PT RTRN 9FT ADLT (ELECTROSURGICAL) ×2 IMPLANT
GLOVE BIO SURGEON STRL SZ 6 (GLOVE) ×4 IMPLANT
GLOVE BIO SURGEON STRL SZ 6.5 (GLOVE) ×6 IMPLANT
GLOVE BIO SURGEON STRL SZ7.5 (GLOVE) ×6 IMPLANT
GLOVE BIOGEL PI IND STRL 6 (GLOVE) ×2 IMPLANT
GLOVE BIOGEL PI IND STRL 6.5 (GLOVE) ×4 IMPLANT
GLOVE BIOGEL PI IND STRL 7.0 (GLOVE) ×4 IMPLANT
GLOVE BIOGEL PI INDICATOR 6 (GLOVE) ×2
GLOVE BIOGEL PI INDICATOR 6.5 (GLOVE) ×4
GLOVE BIOGEL PI INDICATOR 7.0 (GLOVE) ×4
GOWN STRL NON-REIN LRG LVL3 (GOWN DISPOSABLE) ×16 IMPLANT
HEMOSTAT POWDER SURGIFOAM 1G (HEMOSTASIS) ×6 IMPLANT
HEMOSTAT SURGICEL 2X14 (HEMOSTASIS) ×2 IMPLANT
KIT BASIN OR (CUSTOM PROCEDURE TRAY) ×2 IMPLANT
KIT DRAINAGE VACCUM ASSIST (KITS) ×2 IMPLANT
KIT ROOM TURNOVER OR (KITS) ×2 IMPLANT
KIT SUCTION CATH 14FR (SUCTIONS) ×4 IMPLANT
KIT VASOVIEW W/TROCAR VH 2000 (KITS) ×2 IMPLANT
LEAD PACING MYOCARDI (MISCELLANEOUS) ×2 IMPLANT
LINE VENT (MISCELLANEOUS) ×2 IMPLANT
MARKER GRAFT CORONARY BYPASS (MISCELLANEOUS) ×6 IMPLANT
NS IRRIG 1000ML POUR BTL (IV SOLUTION) ×12 IMPLANT
PACK OPEN HEART (CUSTOM PROCEDURE TRAY) ×2 IMPLANT
PAD ARMBOARD 7.5X6 YLW CONV (MISCELLANEOUS) ×4 IMPLANT
PAD ELECT DEFIB RADIOL ZOLL (MISCELLANEOUS) ×2 IMPLANT
PENCIL BUTTON HOLSTER BLD 10FT (ELECTRODE) ×2 IMPLANT
PUNCH AORTIC ROT 4.0MM RCL 40 (MISCELLANEOUS) ×2 IMPLANT
SET CARDIOPLEGIA MPS 5001102 (MISCELLANEOUS) ×2 IMPLANT
SPOGE SURGIFLO 8M (HEMOSTASIS) ×1
SPONGE GAUZE 2X2 NONSTERILE (GAUZE/BANDAGES/DRESSINGS) ×2 IMPLANT
SPONGE GAUZE 4X4 12PLY (GAUZE/BANDAGES/DRESSINGS) ×4 IMPLANT
SPONGE LAP 18X18 X RAY DECT (DISPOSABLE) ×4 IMPLANT
SPONGE SURGIFLO 8M (HEMOSTASIS) ×1 IMPLANT
SUT BONE WAX W31G (SUTURE) ×2 IMPLANT
SUT E-PACK MINIMALLY INVASIVE (SUTURE) IMPLANT
SUT PROLENE 3 0 SH1 36 (SUTURE) ×2 IMPLANT
SUT PROLENE 4 0 TF (SUTURE) ×4 IMPLANT
SUT PROLENE 6 0 C 1 30 (SUTURE) ×2 IMPLANT
SUT PROLENE 6 0 CC (SUTURE) ×34 IMPLANT
SUT PROLENE 7 0 BV1 MDA (SUTURE) ×4 IMPLANT
SUT PROLENE 8 0 BV175 6 (SUTURE) ×8 IMPLANT
SUT STEEL 6MS V (SUTURE) ×2 IMPLANT
SUT STEEL SZ 6 DBL 3X14 BALL (SUTURE) ×2 IMPLANT
SUT VIC AB 1 CTX 18 (SUTURE) ×4 IMPLANT
SUT VIC AB 2-0 CT1 27 (SUTURE) ×1
SUT VIC AB 2-0 CT1 TAPERPNT 27 (SUTURE) ×1 IMPLANT
SUT VIC AB 3-0 X1 27 (SUTURE) ×2 IMPLANT
SUTURE E-PAK OPEN HEART (SUTURE) ×2 IMPLANT
SYSTEM SAHARA CHEST DRAIN ATS (WOUND CARE) ×2 IMPLANT
TAPE CLOTH SURG 4X10 WHT LF (GAUZE/BANDAGES/DRESSINGS) ×4 IMPLANT
TAPE UMBILICAL COTTON 1/8X30 (MISCELLANEOUS) ×2 IMPLANT
TOWEL OR 17X24 6PK STRL BLUE (TOWEL DISPOSABLE) ×6 IMPLANT
TOWEL OR 17X26 10 PK STRL BLUE (TOWEL DISPOSABLE) ×6 IMPLANT
TRAY FOLEY IC TEMP SENS 14FR (CATHETERS) ×2 IMPLANT
TUBE FEEDING 8FR 16IN STR KANG (MISCELLANEOUS) ×2 IMPLANT
TUBING INSUFFLATION 10FT LAP (TUBING) ×2 IMPLANT
UNDERPAD 30X30 INCONTINENT (UNDERPADS AND DIAPERS) ×2 IMPLANT
WATER STERILE IRR 1000ML POUR (IV SOLUTION) ×4 IMPLANT

## 2013-01-22 NOTE — H&P (Signed)
     Chief Complaint: STEMI  HPI: The patient is a 62 y/o female with a history of HLD and tobacco abuse (1/2 ppd), who was transported urgently to [facility], via EMS with a STEMI. She has no prior cardiac history. She initially presented to [facility] for evaluation of substernal chest pain that first began around 12 midnight. She was given 4 baby ASA and nitro paste and her pain improved. Her EKG shows inferior ST elevations. On arrival to [facility], she was chest pain free.    No past medical history on file.  No past surgical history on file.  Family History  Problem Relation Age of Onset  . Heart disease     Social History:  has no tobacco, alcohol, and drug history on file.  Allergies: Allergies not on file  No prescriptions prior to admission    No results found for this or any previous visit (from the past 48 hour(s)). No results found.  Review of Systems  Cardiovascular: Positive for chest pain.  All other systems reviewed and are negative.    There were no vitals taken for this visit. Physical Exam  Constitutional: She is oriented to person, place, and time. She appears well-developed and well-nourished. No distress.  Neck: No JVD present. Carotid bruit is not present.  Cardiovascular: Normal rate, regular rhythm, normal heart sounds and intact distal pulses.  Exam reveals no gallop and no friction rub.   No murmur heard. Respiratory: Effort normal and breath sounds normal. No respiratory distress. She has no wheezes. She has no rales.  Musculoskeletal: She exhibits no edema.  Neurological: She is alert and oriented to person, place, and time.  Skin: Skin is warm and dry. She is not diaphoretic.  Psychiatric: She has a normal mood and affect. Her behavior is normal.     Assessment/Plan Principal Problem:   STEMI (ST elevation myocardial infarction) Active Problems:   Tobacco abuse  Plan: STEMI. Pt taken to cath lab for urgent LHC.   Robbie Lis 01/22/2013, 1:38 PM   Agree with note written by Boyce Medici  PAC  Pt with CP since last PM, worse today. Seen at [facility] ER with Ant ST elev. Transferred for emergent cath /STEMI. Exam benign. Smoker.   Runell Gess 01/22/2013 2:11 PM

## 2013-01-22 NOTE — Anesthesia Preprocedure Evaluation (Addendum)
Anesthesia Evaluation   Patient awake    Reviewed: Allergy & Precautions, H&P , NPO status , Patient's Chart, lab work & pertinent test results  Airway       Dental   Pulmonary Current Smoker,          Cardiovascular + Past MI     Neuro/Psych    GI/Hepatic   Endo/Other    Renal/GU      Musculoskeletal   Abdominal   Peds  Hematology   Anesthesia Other Findings   Reproductive/Obstetrics                          Anesthesia Physical Anesthesia Plan  ASA: IV  Anesthesia Plan: General   Post-op Pain Management:    Induction: Intravenous  Airway Management Planned: Oral ETT  Additional Equipment:   Intra-op Plan:   Post-operative Plan: Post-operative intubation/ventilation  Informed Consent:   Plan Discussed with:   Anesthesia Plan Comments:         Anesthesia Quick Evaluation

## 2013-01-22 NOTE — Transfer of Care (Signed)
Immediate Anesthesia Transfer of Care Note  Patient: [name]  Procedure(s) Performed: Procedure(s): Coronary Artery Bypass Grafting Times Four Using Left Internal Mammary Artery and Right Saphenous Leg Vein Harvested Endoscopically (N/A)  Patient Location: SICU  Anesthesia Type:General  Level of Consciousness: Patient remains intubated per anesthesia plan  Airway & Oxygen Therapy: Patient remains intubated per anesthesia plan and Patient placed on Ventilator (see vital sign flow sheet for setting)  Post-op Assessment: Report given to PACU RN and Post -op Vital signs reviewed and stable  Post vital signs: Reviewed and stable  Complications: No apparent anesthesia complications

## 2013-01-22 NOTE — OR Nursing (Signed)
First call to SICU at 2023, Second call to SICU at 2057

## 2013-01-22 NOTE — CV Procedure (Signed)
[name] is a 62 y.o. female    [id number] LOCATION:  FACILITY: [facility]  PHYSICIAN: Nanetta Batty, M.D. 1950/05/02   DATE OF PROCEDURE:  01/22/2013  DATE OF DISCHARGE:     CARDIAC CATHETERIZATION     History obtained from chart review.The patient is a 62 y/o female with a history of HLD and tobacco abuse (1/2 ppd), who was transported urgently to [facility], via EMS with a STEMI. She has no prior cardiac history. She initially presented to [facility] for evaluation of substernal chest pain that first began around 12 midnight. She was given 4 baby ASA and nitro paste and her pain improved. Her EKG shows inferior ST elevations. On arrival to [facility], she was chest pain free    PROCEDURE DESCRIPTION:   The patient was brought to the second floor [facility] cath lab in the postabsorptive state. She was not premedicated . Right groin waswas prepped and shaved in usual sterile fashion. Xylocaine 1% was used for local anesthesia. A 6 French sheath was inserted into the right common femoral artery using standard Seldinger technique.the 6 French right and left Judkins diagnostic catheters along with a 6 French pigtail catheter were used for selective coronary angiography, left ventriculography, supra-valvular aortography and distal abdominal aortography. Visipaque dye was used for the entirety of the case. Retrograde aortic, left ventricular pullback pressures were recorded.   HEMODYNAMICS:    AO SYSTOLIC/AO DIASTOLIC: 109/62   LV SYSTOLIC/LV DIASTOLIC: 107/21  ANGIOGRAPHIC RESULTS:   1. Left main; 80% distal  2. LAD; 99% proximal with visible thrombus at the first septal perforator 3. Left circumflex; nondominant with a minimal disease. There was a large ramus branch that arose from a height circumflex.  4. Right coronary artery; dominant, totally occluded in proximal portion with grade 2 left-to-right collaterals 5. Left ventriculography; RAO left ventriculogram was performed using   25 mL of Visipaque dye at 12 mL/second. The overall LVEF estimated  40 %  With  wall motion abnormalities notable for moderate anteroapical hypokinesia  6: Supravalvular aortography-there was no dissection noted. Arch vessels were intact including the left subclavian artery  7: Distal abdominal aortography-the distal abdominal aorta and iliac bifurcation were  free of significant disease and suitable for placement of intra-aortic balloon pump  IMPRESSION:Ms. 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 She has a ruptured plaque and approximately the with visible thrombus. She'll need urgent coronary bypass grafting. Dr.Ed Tyrone Sage was present at the end of the procedure.  Runell Gess MD, [facility] 01/22/2013 1:56 PM

## 2013-01-22 NOTE — Brief Op Note (Addendum)
      [address]       [place] [address]             [phone]     01/22/2013  9:27 PM  PATIENT:  [name]  62 y.o. female  PRE-OPERATIVE DIAGNOSIS:  Coronary Artery Disease/ STIMI with critical left main & LAD disease  POST-OPERATIVE DIAGNOSIS:  same  PROCEDURE:  Procedure(s): Placement of left subclavian vein S/G Cath Emergency Coronary Artery Bypass Grafting Times 4  LIMA-LAD; SEQ SVG-INTERM-DIST CX; SVG-RCA INTRAOPERATIVE  TEE [facility] RIGHT LEG   SURGEON:  Surgeon(s): Delight Ovens, MD  PHYSICIAN ASSISTANT: WAYNE GOLD PA-C  ANESTHESIA:   general  PATIENT CONDITION:  ICU - intubated and hemodynamically stable.  PRE-OPERATIVE WEIGHT: 70kg  COMPLICATIONS: NO KNOWN

## 2013-01-22 NOTE — Progress Notes (Signed)
OR TEE complete  

## 2013-01-22 NOTE — Consult Note (Addendum)
[address]       [place] [address]             [phone]        [name] [facility] Medical Record #[id number] Date of Birth: 06-22-1950  Referring: No ref. provider found Primary Care: Ignatius Specking., MD  Chief Complaint:  stemi  History of Present Illness:     The patient is a 62 y/o female with a history of HLD and tobacco abuse one PPD since age 23, who was transported urgently to [facility], via EMS with a STEMI. She has no prior cardiac history. She initially presented to [facility] for evaluation of substernal chest pain that first began around 12 midnight. She was given 4 baby ASA and nitro paste and her pain improved and heparin bolus in [place] . Her EKG shows anterior ST elevations. Called to see in cath lab at 2:00pm because of left main, lad disease, total rca. Currently pateint awake and alert without chest pain    Current Activity/ Functional Status: Patient is independent with mobility/ambulation, transfers, ADL's, IADL's.  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    No prescriptions prior to admission    Family History  Problem Relation Age of Onset  . Heart disease    Patient has 2 nephews one age  94 one age 16 who both had myocardial infarction   Review of Systems:     Cardiac Review of Systems: Y or N  Chest Pain [ y ]  Resting SOB [  n ] Exertional SOB  [ y ]  Orthopnea [ n ]   Pedal Edema [ n ]    Palpitations [ n ] Syncope  [ n ]   Presyncope [  n ]  General Review of Systems: [Y] = yes [  ]=no Constitional: recent weight change [n ]; anorexia [  ]; fatigue [  ]; nausea [  ]; night sweats [ n ]; fever [ n ]; or chills [n ]                                                               Dental: poor dentition[ y ]; Last Dentist visit:   Eye : blurred vision [n  ]; diplopia Christine.Vaughn   ]; vision changes [ n ];  Amaurosis fugax[n  ]; Resp: cough [  ];  wheezing[ y ];  hemoptysis[n  ]; shortness of breath[ y ]; paroxysmal nocturnal dyspnea[ y ]; dyspnea on exertion[y ]; or orthopnea[ y ];  GI:  gallstones[  ], vomiting[  ];  dysphagia[  ]; melena[  ];  hematochezia [  ]; heartburn[  ];   Hx of  Colonoscopy[ n ]; GU: kidney stones [  ]; hematuria[  ];   dysuria [  ];  nocturia[  ];  history of     obstruction [  ]; urinary frequency [  ]             Skin: rash, swelling[  ];, hair loss[  ];  peripheral edema[  ];  or itching[  ]; Musculosketetal: myalgias[  ];  joint swelling[  ];  joint erythema[  ];  joint pain[  ];  back pain[  ];  Heme/Lymph: bruising[  ];  bleeding[  ];  anemia[  ];  Neuro: TIA[  ];  headaches[  ];  stroke[  ];  vertigo[  ];  seizures[  ];   paresthesias[  ];  difficulty walking[  ];  Psych:depression[  ]; anxiety[  ];  Endocrine: diabetes[ n ];  thyroid dysfunction[  ];  Immunizations: Flu [?]; Pneumococcal[?  ];  Other:  Physical Exam: Pulse 88  Wt 154 lb 5.2 oz (70 kg)  General appearance: alert, cooperative, appears older than stated age and no distress Neurologic: intact Heart: regular rate and rhythm, S1, S2 normal, no murmur, click, rub or gallop Lungs: diminished breath sounds bilaterally Abdomen: soft, non-tender; bowel sounds normal; no masses,  no  organomegaly Extremities: no edema rt pt and dt 2+, no left dp scar left ankle from ankle fracture cath in rt groin   Diagnostic Studies & Laboratory data:    Cath:HEMODYNAMICS:  AO SYSTOLIC/AO DIASTOLIC: 109/62  LV SYSTOLIC/LV DIASTOLIC: 107/21  ANGIOGRAPHIC RESULTS:  1. Left main; 80% distal  2. LAD; 99% proximal with visible thrombus at the first septal perforator  3. Left circumflex; nondominant with a minimal disease. There was a large ramus branch that arose from a height circumflex.  4. Right coronary artery; dominant, totally occluded in proximal portion with grade 2 left-to-right collaterals  5. Left ventriculography; RAO left ventriculogram was performed using  25 mL of Visipaque dye at 12 mL/second. The overall LVEF estimated  40 % With wall motion abnormalities notable for moderate anteroapical hypokinesia  6: Supravalvular aortography-there was no dissection noted. Arch vessels were intact including the left subclavian artery  7: Distal abdominal aortography-the distal abdominal aorta and iliac bifurcation were free of significant disease and suitable for placement of intra-aortic balloon pump  IMPRESSION: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 She has a ruptured plaque and approximately the with visible thrombus. She'll need urgent coronary bypass grafting. Dr.Ed Tyrone Sage was present at the end of the procedure.  Runell Gess MD, [facility]  01/22/2013  1:56 PM   90 ml contrast    Recent Radiology Findings:   No results found.    Recent Lab Findings: Lab Results  Component Value Date   WBC 10.0 01/22/2013   HGB 12.7 01/22/2013   HCT 37.7 01/22/2013   PLT 246 01/22/2013   GLUCOSE 200* 01/22/2013   CHOL 273* 01/22/2013   TRIG 196* 01/22/2013   HDL 32* 01/22/2013   LDLCALC 202* 01/22/2013   ALT 9 01/22/2013   AST 15 01/22/2013   NA 130* 01/22/2013   K 3.9 01/22/2013   CL 98 01/22/2013   CREATININE 0.74 01/22/2013   BUN 17  01/22/2013   CO2 23 01/22/2013   Lab Results  Component Value Date   CKTOTAL 101  01/22/2013   CKMB 11.0* 01/22/2013   TROPONINI 1.62* 01/22/2013    Assessment / Plan:   STEMI- acute with 80% left main obstruction, ruptured plaque proximal LAD, total right Probable COPD with 50-pack-year smoking history   Emergency coronary artery bypass grafting moving the patient directly from the cath lab to the door and is recommended to the patient, her family still has not arrived , Dr. Gery Pray talked to them on phone. The goals risks and alternatives of the planned surgical procedure emergency CABG  have been discussed with the patient in detail. The risks of the procedure including death, infection, stroke, respiratory failuremyocardial infarction, bleeding, blood transfusion have all been discussed specifically.  I have quoted Emilie Rutter a 8% of perioperative mortality and a complication rate as high as 40 %. The patient's questions have been answered.Johniya Durfee is willing  to proceed with the planned procedure.    Delight Ovens MD      [address] [place] [place] [address] Office [phone]   Beeper [phone]  01/22/2013 2:45 PM

## 2013-01-22 NOTE — Anesthesia Procedure Notes (Addendum)
Procedure Name: Intubation Date/Time: 01/22/2013 3:32 PM Performed by: Tyrone Nine Pre-anesthesia Checklist: Patient identified, Emergency Drugs available, Suction available, Patient being monitored and Timeout performed Patient Re-evaluated:Patient Re-evaluated prior to inductionOxygen Delivery Method: Circle system utilized Preoxygenation: Pre-oxygenation with 100% oxygen Intubation Type: IV induction, Cricoid Pressure applied and Rapid sequence Laryngoscope Size: Mac and 3 Grade View: Grade I Tube type: Oral Tube size: 8.5 mm Number of attempts: 1 Airway Equipment and Method: Stylet Placement Confirmation: ETT inserted through vocal cords under direct vision,  positive ETCO2 and breath sounds checked- equal and bilateral Secured at: 21 cm Tube secured with: Tape Dental Injury: Teeth and Oropharynx as per pre-operative assessment     Procedures: Right IJ Theone Murdoch Catheter Insertion: In OR [id number]: The patient was identified and consent obtained.  TO was performed, and full barrier precautions were used.  The skin was anesthetized with lidocaine-4cc plain with 25g needle.  Once the vein was located with the 22 ga. needle using ultrasound guidance , the wire was inserted into the vein.  The wire location was confirmed with ultrasound.  The tissue was dilated and the 8.5 [place] cordis catheter was carefully inserted. Afterwards Theone Murdoch catheter was curling up in ventricle, patient stable. Elected to reposition after before bypass.   The patient tolerated the procedure well.

## 2013-01-22 NOTE — Anesthesia Postprocedure Evaluation (Signed)
  Anesthesia Post-op Note  Patient: [name]  Procedure(s) Performed: Procedure(s): Coronary Artery Bypass Grafting Times Four Using Left Internal Mammary Artery and Right Saphenous Leg Vein Harvested Endoscopically (N/A)  Patient Location: PACU and SICU  Anesthesia Type:General  Level of Consciousness: awake  Airway and Oxygen Therapy: Patient Spontanous Breathing  Post-op Pain: mild  Post-op Assessment: Post-op Vital signs reviewed  Post-op Vital Signs: Reviewed  Complications: No apparent anesthesia complications

## 2013-01-23 ENCOUNTER — Inpatient Hospital Stay (HOSPITAL_COMMUNITY): Payer: BC Managed Care – PPO

## 2013-01-23 DIAGNOSIS — Z951 Presence of aortocoronary bypass graft: Secondary | ICD-10-CM

## 2013-01-23 LAB — GLUCOSE, CAPILLARY
Glucose-Capillary: 101 mg/dL — ABNORMAL HIGH (ref 70–99)
Glucose-Capillary: 116 mg/dL — ABNORMAL HIGH (ref 70–99)
Glucose-Capillary: 119 mg/dL — ABNORMAL HIGH (ref 70–99)
Glucose-Capillary: 141 mg/dL — ABNORMAL HIGH (ref 70–99)
Glucose-Capillary: 159 mg/dL — ABNORMAL HIGH (ref 70–99)
Glucose-Capillary: 84 mg/dL (ref 70–99)
Glucose-Capillary: 93 mg/dL (ref 70–99)

## 2013-01-23 LAB — BASIC METABOLIC PANEL
BUN: 11 mg/dL (ref 6–23)
BUN: 11 mg/dL (ref 6–23)
CO2: 25 mEq/L (ref 19–32)
Calcium: 7 mg/dL — ABNORMAL LOW (ref 8.4–10.5)
Calcium: 7.4 mg/dL — ABNORMAL LOW (ref 8.4–10.5)
Chloride: 110 mEq/L (ref 96–112)
Creatinine, Ser: 0.74 mg/dL (ref 0.50–1.10)
Creatinine, Ser: 0.76 mg/dL (ref 0.50–1.10)
GFR calc Af Amer: >90 mL/min (ref >90)
GFR calc non Af Amer: 89 mL/min — ABNORMAL LOW (ref >90)
GFR calc non Af Amer: 89 mL/min — ABNORMAL LOW (ref >90)
Glucose, Bld: 149 mg/dL — ABNORMAL HIGH (ref 70–99)
Glucose, Bld: 96 mg/dL (ref 70–99)
Potassium: 2.9 mEq/L — ABNORMAL LOW (ref 3.5–5.1)
Potassium: 4.6 mEq/L (ref 3.5–5.1)
Sodium: 139 mEq/L (ref 135–145)

## 2013-01-23 LAB — TYPE AND SCREEN
ABO/RH(D): O POS
Antibody Screen: NEGATIVE
Unit division: 0
Unit division: 0

## 2013-01-23 LAB — POCT I-STAT, CHEM 8
BUN: 10 mg/dL (ref 6–23)
Calcium, Ion: 1.22 mmol/L (ref 1.13–1.30)
Chloride: 106 mEq/L (ref 96–112)
Creatinine, Ser: 0.8 mg/dL (ref 0.50–1.10)
Glucose, Bld: 96 mg/dL (ref 70–99)
Hemoglobin: 11.2 g/dL — ABNORMAL LOW (ref 12.0–15.0)
Potassium: 4.5 mEq/L (ref 3.5–5.1)
Sodium: 142 mEq/L (ref 135–145)
TCO2: 23 mmol/L (ref 0–100)

## 2013-01-23 LAB — POCT I-STAT 3, ART BLOOD GAS (G3+)
Acid-base deficit: 4 mmol/L — ABNORMAL HIGH (ref 0.0–2.0)
Bicarbonate: 23.1 mEq/L (ref 20.0–24.0)
Patient temperature: 36.1
pH, Arterial: 7.303 — ABNORMAL LOW (ref 7.350–7.450)

## 2013-01-23 LAB — CBC
HCT: 31.1 % — ABNORMAL LOW (ref 36.0–46.0)
Hemoglobin: 10.4 g/dL — ABNORMAL LOW (ref 12.0–15.0)
MCH: 29.4 pg (ref 26.0–34.0)
MCHC: 33.4 g/dL (ref 30.0–36.0)
MCHC: 33.8 g/dL (ref 30.0–36.0)
MCV: 87.9 fL (ref 78.0–100.0)
Platelets: 109 10*3/uL — ABNORMAL LOW (ref 150–400)
RBC: 3.4 MIL/uL — ABNORMAL LOW (ref 3.87–5.11)
RDW: 15 % (ref 11.5–15.5)
WBC: 16.3 10*3/uL — ABNORMAL HIGH (ref 4.0–10.5)

## 2013-01-23 LAB — CREATININE, SERUM
Creatinine, Ser: 0.72 mg/dL (ref 0.50–1.10)
GFR calc Af Amer: >90 mL/min (ref >90)
GFR calc non Af Amer: >90 mL/min (ref >90)

## 2013-01-23 LAB — MAGNESIUM: Magnesium: 2.4 mg/dL (ref 1.5–2.5)

## 2013-01-23 LAB — POCT ACTIVATED CLOTTING TIME: Activated Clotting Time: 121 seconds

## 2013-01-23 LAB — MRSA PCR SCREENING: MRSA by PCR: NEGATIVE

## 2013-01-23 MED ORDER — POTASSIUM CHLORIDE 10 MEQ/50ML IV SOLN
10.0000 meq | Freq: Once | INTRAVENOUS | Status: AC
  Administered 2013-01-23: 10 meq via INTRAVENOUS

## 2013-01-23 MED ORDER — POTASSIUM CHLORIDE 10 MEQ/50ML IV SOLN
10.0000 meq | INTRAVENOUS | Status: AC
  Administered 2013-01-23 (×3): 10 meq via INTRAVENOUS

## 2013-01-23 NOTE — Progress Notes (Signed)
Patient ID: [name], female   DOB: 03-18-1950, 62 y.o.   MRN: [id number] TCTS DAILY ICU PROGRESS NOTE                   [address]            [place] [address]          [phone]   1 Day Post-Op Procedure(s) (LRB): Coronary Artery Bypass Grafting Times Four Using Left Internal Mammary Artery and Right Saphenous Leg Vein Harvested Endoscopically (N/A)  Total Length of Stay:  LOS: 1 day   Subjective: Remains on vent, getting sedation but follows commands and moves all extremities to command  Objective: Vital signs in last 24 hours: Temp:  [95.5 F (35.3 C)-99.5 F (37.5 C)] 99.5 F (37.5 C) (12/28 0800) Pulse Rate:  [88-90] 90 (12/28 0800) Cardiac Rhythm:  [-] Atrial paced (12/28 0330) Resp:  [12-21] 12 (12/28 0800) SpO2:  [89 %-100 %] 97 % (12/28 0800) Arterial Line BP: (90-126)/(44-64) 98/56 mmHg (12/28 0800) FiO2 (%):  [50 %] 50 % (12/28 0337) Weight:  [154 lb 5.2 oz (70 kg)-160 lb 15 oz (73 kg)] 160 lb 15 oz (73 kg) (12/28 0600)  Filed Weights   01/22/13 1401 01/23/13 0600  Weight: 154 lb 5.2 oz (70 kg) 160 lb 15 oz (73 kg)    Weight change:    Hemodynamic parameters for last 24 hours: PAP: (21-45)/(11-29) 29/18 mmHg CO:  [3.5 L/min-4.2 L/min] 4.2 L/min CI:  [1.9 L/min/m2-2.3 L/min/m2] 2.3 L/min/m2  Intake/Output from previous day: 12/27 0701 - 12/28 0700 In: 5720.1 [I.V.:3624.1; Blood:466; NG/GT:30; IV Piggyback:1600] Out: 4020 [Urine:2970; Blood:800; Chest Tube:250]  Intake/Output this shift: Total I/O In: 322.6 [I.V.:192.6; NG/GT:30; IV Piggyback:100] Out: 195 [Urine:165; Chest Tube:30]  Current Meds: Scheduled Meds: . acetaminophen  1,000 mg Oral Q6H   Or  . acetaminophen (TYLENOL) oral liquid 160 mg/5 mL  1,000 mg Per Tube Q6H  . aspirin EC  325 mg Oral Daily   Or  . aspirin  324 mg Per Tube Daily  . bisacodyl  10 mg Oral Daily   Or  . bisacodyl  10 mg Rectal Daily  . cefUROXime (ZINACEF)  IV  1.5 g Intravenous Q12H  .  docusate sodium  200 mg Oral Daily  . famotidine (PEPCID) IV  20 mg Intravenous Q12H  . insulin regular  0-10 Units Intravenous TID WC  . levalbuterol  0.63 mg Nebulization Q6H  . metoprolol tartrate  12.5 mg Oral BID   Or  . metoprolol tartrate  12.5 mg Per Tube BID  . [START ON 01/24/2013] pantoprazole  40 mg Oral Daily  . potassium chloride  10 mEq Intravenous Q1 Hr x 3  . sodium chloride  3 mL Intravenous Q12H   Continuous Infusions: . sodium chloride 20 mL/hr at 01/22/13 2255  . sodium chloride 10 mL/hr at 01/22/13 2254  . sodium chloride    . dexmedetomidine 0.7 mcg/kg/hr (01/23/13 0900)  . DOPamine 5 mcg/kg/min (01/23/13 0900)  . insulin (NOVOLIN-R) infusion 2.2 mL/hr at 01/23/13 0800  . lactated ringers 20 mL/hr (01/22/13 2218)  . milrinone 0.3 mcg/kg/min (01/23/13 0800)  . nitroGLYCERIN 5 mcg/min (01/22/13 2145)  . phenylephrine (NEO-SYNEPHRINE) Adult infusion 60 mcg/min (01/23/13 0900)   PRN Meds:.albumin human, metoprolol, midazolam, morphine injection, morphine injection, ondansetron (ZOFRAN) IV, oxyCODONE, sodium chloride  General appearance: alert, cooperative and no distress Neurologic: intact Heart: regular rate and rhythm, S1, S2 normal, no murmur, click, rub or  gallop Lungs: diminished breath sounds bibasilar Abdomen: soft, non-tender; bowel sounds normal; no masses,  no organomegaly Extremities: extremities normal, atraumatic, no cyanosis or edema and rt groin sheath in place, feet warm Wound: sternum stable  Lab Results: CBC: Recent Labs  01/22/13 2200 01/23/13 0417  WBC 19.2* 16.2*  HGB 12.2 10.4*  HCT 36.0 31.1*  PLT 140* 120*   BMET:  Recent Labs  01/22/13 1345  01/22/13 2150 01/23/13 0417  NA 130*  < > 140 139  K 3.9  < > 3.3* 2.9*  CL 98  --   --  108  CO2 23  --   --  24  GLUCOSE 200*  < > 163* 149*  BUN 17  --   --  11  CREATININE 0.74  --   --  0.74  CALCIUM 7.7*  --   --  7.0*  < > = values in this interval not displayed.    PT/INR:  Recent Labs  01/22/13 2200  LABPROT 17.8*  INR 1.51*   Radiology: Dg Chest Portable 1 View In Am  01/23/2013   CLINICAL DATA:  Status post CABG.  EXAM: PORTABLE CHEST - 1 VIEW  COMPARISON:  01/22/2013  FINDINGS: Endotracheal tube remains with the tip approximately 3.5 cm above the carina. Swan-Ganz catheter tip lies in the main pulmonary artery. Left chest tube present. No pneumothorax identified. Bibasilar atelectasis. No significant pleural effusions. No overt edema. The heart size and mediastinal contours are stable.  IMPRESSION: No pneumothorax.  Mild bibasilar atelectasis.   Electronically Signed   By: Irish Lack M.D.   On: 01/23/2013 07:58   Dg Chest Portable 1 View  01/22/2013   CLINICAL DATA:  Coronary bypass  EXAM: PORTABLE CHEST - 1 VIEW  COMPARISON:  None.  FINDINGS: Endotracheal tube 5.5 cm above the carina. NG tube at the gastroesophageal junction and can be advanced 5 cm into the stomach. Swan-Ganz catheter in the pulmonary outflow tract centrally from a left subclavian approach. Left chest tube in place. Normal heart size with mild interstitial prominence versus edema. Minimal basilar atelectasis. No effusion or pneumothorax.  IMPRESSION: Mild interstitial prominence versus early edema  Basilar atelectasis  No effusion or pneumothorax.   Electronically Signed   By: Ruel Favors M.D.   On: 01/22/2013 21:57     Assessment/Plan: S/P Procedure(s) (LRB): Coronary Artery Bypass Grafting Times Four Using Left Internal Mammary Artery and Right Saphenous Leg Vein Harvested Endoscopically (N/A) Diuresis Continue foley due to strict I&O, patient critically ill, patient in ICU and urinary output monitoring d/c rt groin line CI good, still on neo, will decrease milirinone  Expected Acute  Blood - loss Anemia Wean vent slowly as tolerated   [name] 01/23/2013 9:02 AM

## 2013-01-23 NOTE — Progress Notes (Signed)
Patient ID: [name], female   DOB: 1950-11-19, 62 y.o.   MRN: [id number] EVENING ROUNDS NOTE :     [address]       [place] [address]             [phone]                 1 Day Post-Op Procedure(s) (LRB): Coronary Artery Bypass Grafting Times Four Using Left Internal Mammary Artery and Right Saphenous Leg Vein Harvested Endoscopically (N/A)  Total Length of Stay:  LOS: 1 day  BP 101/52  Pulse 89  Temp(Src) 100 F (37.8 C) (Core (Comment))  Resp 14  Ht 5\' 6"  (1.676 m)  Wt 160 lb 15 oz (73 kg)  BMI 25.99 kg/m2  SpO2 97%  .Intake/Output     12/27 0701 - 12/28 0700 12/28 0701 - 12/29 0700   I.V. (mL/kg) 3624.1 (49.6) 1139.8 (15.6)   Blood 466    NG/GT 30 140   IV Piggyback 1600 250   Total Intake(mL/kg) 5720.1 (78.4) 1529.8 (21)   Urine (mL/kg/hr) 2970 690 (0.8)   Blood 800    Chest Tube 250 170 (0.2)   Total Output 4020 860   Net +1700.1 +669.8          . sodium chloride 20 mL/hr at 01/22/13 2255  . sodium chloride 10 mL/hr at 01/22/13 2254  . sodium chloride    . dexmedetomidine 0.7 mcg/kg/hr (01/23/13 1840)  . DOPamine 5 mcg/kg/min (01/23/13 1800)  . insulin (NOVOLIN-R) infusion 1.5 mL/hr at 01/23/13 1800  . lactated ringers 20 mL/hr (01/22/13 2218)  . milrinone 0.125 mcg/kg/min (01/23/13 1800)  . nitroGLYCERIN 5 mcg/min (01/22/13 2145)  . phenylephrine (NEO-SYNEPHRINE) Adult infusion 30 mcg/min (01/23/13 1800)     Lab Results  Component Value Date   WBC 16.3* 01/23/2013   HGB 10.1* 01/23/2013   HCT 29.9* 01/23/2013   PLT 109* 01/23/2013   GLUCOSE 96 01/23/2013   CHOL 273* 01/22/2013   TRIG 196* 01/22/2013   HDL 32* 01/22/2013   LDLCALC 202* 01/22/2013   ALT 9 01/22/2013   AST 15 01/22/2013   NA 142 01/23/2013   K 4.5 01/23/2013   CL 106 01/23/2013   CREATININE 0.72 01/23/2013   BUN 10 01/23/2013   CO2 25 01/23/2013   INR 1.51* 01/22/2013   HGBA1C 7.0* 01/22/2013   On milrinone and dopamine, weaning neo Still on  vent, Neuro intact Wean vent over then 8-10 hours and extubated if stable and am xray is clear  Delight Ovens MD  Beeper [phone] Office [phone] 01/23/2013 6:50 PM

## 2013-01-23 NOTE — Progress Notes (Signed)
Right groin sheath d/c per protocol with no complications. Hemostasis achieved within 20 minutes. Right groin site is a level 0, confirmed with bedside nurse. Will continue to monitor per protocol.

## 2013-01-23 NOTE — Progress Notes (Signed)
Subjective:  Intubated, sedated but awake, alert and responsive   Objective:  Temp:  [95.5 F (35.3 C)-99.3 F (37.4 C)] 99.3 F (37.4 C) (12/28 0715) Pulse Rate:  [88-90] 90 (12/28 0715) Resp:  [12-21] 12 (12/28 0715) SpO2:  [89 %-100 %] 97 % (12/28 0715) Arterial Line BP: (90-126)/(44-64) 99/57 mmHg (12/28 0715) FiO2 (%):  [50 %] 50 % (12/28 0337) Weight:  [154 lb 5.2 oz (70 kg)-160 lb 15 oz (73 kg)] 160 lb 15 oz (73 kg) (12/28 0600) Weight change:   Intake/Output from previous day: 12/27 0701 - 12/28 0700 In: 5720.1 [I.V.:3624.1; Blood:466; NG/GT:30; IV Piggyback:1600] Out: 4020 [Urine:2970; Blood:800; Chest Tube:250]  Intake/Output from this shift:    Physical Exam: General appearance: alert and no distress Neck: no adenopathy, no carotid bruit, no JVD, supple, symmetrical, trachea midline and thyroid not enlarged, symmetric, no tenderness/mass/nodules Lungs: clear to auscultation bilaterally Heart: regular rate and rhythm, S1, S2 normal, no murmur, click, rub or gallop Extremities: extremities normal, atraumatic, no cyanosis or edema and Right fem art sheath still in place  Lab Results: Results for orders placed during the hospital encounter of 01/22/13 (from the past 48 hour(s))  CK TOTAL AND CKMB     Status: Abnormal   Collection Time    01/22/13  1:45 PM      Result Value Range   Total CK 101  7 - 177 U/L   CK, MB 11.0 (*) 0.3 - 4.0 ng/mL   Comment: CRITICAL RESULT CALLED TO, READ BACK BY AND VERIFIED WITH:     K.HARDY,RN 1438 01/22/13 M.CAMPBELL   Relative Index 10.9 (*) 0.0 - 2.5  TROPONIN I     Status: Abnormal   Collection Time    01/22/13  1:45 PM      Result Value Range   Troponin I 1.62 (*) <0.30 ng/mL   Comment:            Due to the release kinetics of cTnI,     a negative result within the first hours     of the onset of symptoms does not rule out     myocardial infarction with certainty.     If myocardial infarction is still suspected,       repeat the test at appropriate intervals.     CRITICAL RESULT CALLED TO, READ BACK BY AND VERIFIED WITH:     K.HARDY,RN 1438 01/22/13 M.CAMPBELL  CBC     Status: None   Collection Time    01/22/13  1:45 PM      Result Value Range   WBC 10.0  4.0 - 10.5 K/uL   RBC 4.17  3.87 - 5.11 MIL/uL   Hemoglobin 12.7  12.0 - 15.0 g/dL   HCT 16.1  09.6 - 04.5 %   MCV 90.4  78.0 - 100.0 fL   MCH 30.5  26.0 - 34.0 pg   MCHC 33.7  30.0 - 36.0 g/dL   RDW 40.9  81.1 - 91.4 %   Platelets 246  150 - 400 K/uL  COMPREHENSIVE METABOLIC PANEL     Status: Abnormal   Collection Time    01/22/13  1:45 PM      Result Value Range   Sodium 130 (*) 135 - 145 mEq/L   Potassium 3.9  3.5 - 5.1 mEq/L   Chloride 98  96 - 112 mEq/L   CO2 23  19 - 32 mEq/L   Glucose, Bld 200 (*) 70 - 99  mg/dL   BUN 17  6 - 23 mg/dL   Creatinine, Ser 2.13  0.50 - 1.10 mg/dL   Calcium 7.7 (*) 8.4 - 10.5 mg/dL   Total Protein 5.7 (*) 6.0 - 8.3 g/dL   Albumin 2.9 (*) 3.5 - 5.2 g/dL   AST 15  0 - 37 U/L   ALT 9  0 - 35 U/L   Alkaline Phosphatase 69  39 - 117 U/L   Total Bilirubin 0.2 (*) 0.3 - 1.2 mg/dL   GFR calc non Af Amer 89 (*) >90 mL/min   GFR calc Af Amer >90  >90 mL/min   Comment: (NOTE)     The eGFR has been calculated using the CKD EPI equation.     This calculation has not been validated in all clinical situations.     eGFR's persistently <90 mL/min signify possible Chronic Kidney     Disease.  HEMOGLOBIN A1C     Status: Abnormal   Collection Time    01/22/13  1:45 PM      Result Value Range   Hemoglobin A1C 7.0 (*) <5.7 %   Comment: (NOTE)                                                                               According to the ADA Clinical Practice Recommendations for 2011, when     HbA1c is used as a screening test:      >=6.5%   Diagnostic of Diabetes Mellitus               (if abnormal result is confirmed)     5.7-6.4%   Increased risk of developing Diabetes Mellitus     References:Diagnosis and  Classification of Diabetes Mellitus,Diabetes     Care,2011,34(Suppl 1):S62-S69 and Standards of Medical Care in             Diabetes - 2011,Diabetes Care,2011,34 (Suppl 1):S11-S61.   Mean Plasma Glucose 154 (*) <117 mg/dL   Comment: Performed at [facility]  LIPID PANEL     Status: Abnormal   Collection Time    01/22/13  1:45 PM      Result Value Range   Cholesterol 273 (*) 0 - 200 mg/dL   Triglycerides 086 (*) <150 mg/dL   HDL 32 (*) >57 mg/dL   Total CHOL/HDL Ratio 8.5     VLDL 39  0 - 40 mg/dL   LDL Cholesterol 846 (*) 0 - 99 mg/dL   Comment:            Total Cholesterol/HDL:CHD Risk     Coronary Heart Disease Risk Table                         Men   Women      1/2 Average Risk   3.4   3.3      Average Risk       5.0   4.4      2 X Average Risk   9.6   7.1      3 X Average Risk  23.4   11.0  Use the calculated Patient Ratio     above and the CHD Risk Table     to determine the patient's CHD Risk.                ATP III CLASSIFICATION (LDL):      <100     mg/dL   Optimal      161-096  mg/dL   Near or Above                        Optimal      130-159  mg/dL   Borderline      045-409  mg/dL   High      >811     mg/dL   Very High  TYPE AND SCREEN     Status: None   Collection Time    01/22/13  1:45 PM      Result Value Range   ABO/RH(D) O POS     Antibody Screen NEG     Sample Expiration 01/25/2013     Unit Number [id number]     Blood Component Type RBC LR PHER1     Unit division 00     Status of Unit ISSUED     Transfusion Status OK TO TRANSFUSE     Crossmatch Result Compatible     Unit Number [id number]     Blood Component Type RBC LR PHER1     Unit division 00     Status of Unit ISSUED     Transfusion Status OK TO TRANSFUSE     Crossmatch Result Compatible    ABO/RH     Status: None   Collection Time    01/22/13  1:45 PM      Result Value Range   ABO/RH(D) O POS    POCT I-STAT 4, (NA,K, GLUC, HGB,HCT)     Status: Abnormal    Collection Time    01/22/13  3:25 PM      Result Value Range   Sodium 137  135 - 145 mEq/L   Potassium 4.2  3.5 - 5.1 mEq/L   Glucose, Bld 168 (*) 70 - 99 mg/dL   HCT 84.1  32.4 - 40.1 %   Hemoglobin 14.6  12.0 - 15.0 g/dL  POCT I-STAT 3, BLOOD GAS (G3+)     Status: Abnormal   Collection Time    01/22/13  3:28 PM      Result Value Range   pH, Arterial 7.325 (*) 7.350 - 7.450   pCO2 arterial 51.0 (*) 35.0 - 45.0 mmHg   pO2, Arterial 481.0 (*) 80.0 - 100.0 mmHg   Bicarbonate 26.6 (*) 20.0 - 24.0 mEq/L   TCO2 28  0 - 100 mmol/L   O2 Saturation 100.0     Sample type ARTERIAL    POCT I-STAT 4, (NA,K, GLUC, HGB,HCT)     Status: Abnormal   Collection Time    01/22/13  4:49 PM      Result Value Range   Sodium 139  135 - 145 mEq/L   Potassium 3.4 (*) 3.5 - 5.1 mEq/L   Glucose, Bld 150 (*) 70 - 99 mg/dL   HCT 02.7 (*) 25.3 - 66.4 %   Hemoglobin 11.2 (*) 12.0 - 15.0 g/dL  POCT I-STAT 4, (NA,K, GLUC, HGB,HCT)     Status: Abnormal   Collection Time    01/22/13  5:47 PM      Result Value Range   Sodium 125 (*) 135 -  145 mEq/L   Potassium 3.5  3.5 - 5.1 mEq/L   Glucose, Bld 102 (*) 70 - 99 mg/dL   HCT 16.1 (*) 09.6 - 04.5 %   Hemoglobin 7.1 (*) 12.0 - 15.0 g/dL  POCT I-STAT 3, BLOOD GAS (G3+)     Status: Abnormal   Collection Time    01/22/13  5:50 PM      Result Value Range   pH, Arterial 7.356  7.350 - 7.450   pCO2 arterial 37.4  35.0 - 45.0 mmHg   pO2, Arterial 370.0 (*) 80.0 - 100.0 mmHg   Bicarbonate 20.9  20.0 - 24.0 mEq/L   TCO2 22  0 - 100 mmol/L   O2 Saturation 100.0     Acid-base deficit 4.0 (*) 0.0 - 2.0 mmol/L   Sample type ARTERIAL    POCT I-STAT 4, (NA,K, GLUC, HGB,HCT)     Status: Abnormal   Collection Time    01/22/13  6:46 PM      Result Value Range   Sodium 133 (*) 135 - 145 mEq/L   Potassium 5.4 (*) 3.5 - 5.1 mEq/L   Glucose, Bld 158 (*) 70 - 99 mg/dL   HCT 40.9 (*) 81.1 - 91.4 %   Hemoglobin 8.8 (*) 12.0 - 15.0 g/dL  PLATELET COUNT     Status: Abnormal     Collection Time    01/22/13  6:55 PM      Result Value Range   Platelets 106 (*) 150 - 400 K/uL   Comment: DELTA CHECK NOTED     PLATELET COUNT CONFIRMED BY SMEAR     RESULT CALLED TO, READ BACK BY AND VERIFIED WITH:     HARDY,S RN 01/22/13 1926 WOOTEN,K  HEMOGLOBIN AND HEMATOCRIT, BLOOD     Status: Abnormal   Collection Time    01/22/13  6:55 PM      Result Value Range   Hemoglobin 8.6 (*) 12.0 - 15.0 g/dL   Comment: DELTA CHECK NOTED     RESULT CALLED TO, READ BACK BY AND VERIFIED WITH:     HARDY,S RN 01/22/13 1926 WOOTEN,K   HCT 24.6 (*) 36.0 - 46.0 %  POCT I-STAT 4, (NA,K, GLUC, HGB,HCT)     Status: Abnormal   Collection Time    01/22/13  7:46 PM      Result Value Range   Sodium 135  135 - 145 mEq/L   Potassium 5.0  3.5 - 5.1 mEq/L   Glucose, Bld 174 (*) 70 - 99 mg/dL   HCT 78.2 (*) 95.6 - 21.3 %   Hemoglobin 8.8 (*) 12.0 - 15.0 g/dL  POCT I-STAT 4, (NA,K, GLUC, HGB,HCT)     Status: Abnormal   Collection Time    01/22/13  8:19 PM      Result Value Range   Sodium 139  135 - 145 mEq/L   Potassium 4.3  3.5 - 5.1 mEq/L   Glucose, Bld 186 (*) 70 - 99 mg/dL   HCT 08.6 (*) 57.8 - 46.9 %   Hemoglobin 9.2 (*) 12.0 - 15.0 g/dL  POCT I-STAT 3, BLOOD GAS (G3+)     Status: Abnormal   Collection Time    01/22/13  8:22 PM      Result Value Range   pH, Arterial 7.283 (*) 7.350 - 7.450   pCO2 arterial 50.8 (*) 35.0 - 45.0 mmHg   pO2, Arterial 326.0 (*) 80.0 - 100.0 mmHg   Bicarbonate 24.0  20.0 - 24.0 mEq/L  TCO2 26  0 - 100 mmol/L   O2 Saturation 100.0     Acid-base deficit 3.0 (*) 0.0 - 2.0 mmol/L   Sample type ARTERIAL    GLUCOSE, CAPILLARY     Status: Abnormal   Collection Time    01/22/13  9:44 PM      Result Value Range   Glucose-Capillary 196 (*) 70 - 99 mg/dL   Comment 1 Glucose Stabilizer    POCT I-STAT 3, BLOOD GAS (G3+)     Status: Abnormal   Collection Time    01/22/13  9:46 PM      Result Value Range   pH, Arterial 7.319 (*) 7.350 - 7.450   pCO2 arterial  39.3  35.0 - 45.0 mmHg   pO2, Arterial 88.0  80.0 - 100.0 mmHg   Bicarbonate 20.6  20.0 - 24.0 mEq/L   TCO2 22  0 - 100 mmol/L   O2 Saturation 97.0     Acid-base deficit 6.0 (*) 0.0 - 2.0 mmol/L   Patient temperature 35.4 C     Collection site ARTERIAL LINE     Drawn by Operator     Sample type ARTERIAL    POCT I-STAT 4, (NA,K, GLUC, HGB,HCT)     Status: Abnormal   Collection Time    01/22/13  9:50 PM      Result Value Range   Sodium 140  135 - 145 mEq/L   Potassium 3.3 (*) 3.5 - 5.1 mEq/L   Glucose, Bld 163 (*) 70 - 99 mg/dL   HCT 16.1  09.6 - 04.5 %   Hemoglobin 12.2  12.0 - 15.0 g/dL  CBC     Status: Abnormal   Collection Time    01/22/13 10:00 PM      Result Value Range   WBC 19.2 (*) 4.0 - 10.5 K/uL   RBC 4.08  3.87 - 5.11 MIL/uL   Hemoglobin 12.2  12.0 - 15.0 g/dL   HCT 40.9  81.1 - 91.4 %   MCV 88.2  78.0 - 100.0 fL   MCH 29.9  26.0 - 34.0 pg   MCHC 33.9  30.0 - 36.0 g/dL   RDW 78.2  95.6 - 21.3 %   Platelets 140 (*) 150 - 400 K/uL  PROTIME-INR     Status: Abnormal   Collection Time    01/22/13 10:00 PM      Result Value Range   Prothrombin Time 17.8 (*) 11.6 - 15.2 seconds   INR 1.51 (*) 0.00 - 1.49  APTT     Status: None   Collection Time    01/22/13 10:00 PM      Result Value Range   aPTT 33  24 - 37 seconds  CBC     Status: Abnormal   Collection Time    01/23/13  4:17 AM      Result Value Range   WBC 16.2 (*) 4.0 - 10.5 K/uL   RBC 3.54 (*) 3.87 - 5.11 MIL/uL   Hemoglobin 10.4 (*) 12.0 - 15.0 g/dL   HCT 08.6 (*) 57.8 - 46.9 %   MCV 87.9  78.0 - 100.0 fL   MCH 29.4  26.0 - 34.0 pg   MCHC 33.4  30.0 - 36.0 g/dL   RDW 62.9  52.8 - 41.3 %   Platelets 120 (*) 150 - 400 K/uL  BASIC METABOLIC PANEL     Status: Abnormal   Collection Time    01/23/13  4:17 AM  Result Value Range   Sodium 139  135 - 145 mEq/L   Potassium 2.9 (*) 3.5 - 5.1 mEq/L   Chloride 108  96 - 112 mEq/L   CO2 24  19 - 32 mEq/L   Glucose, Bld 149 (*) 70 - 99 mg/dL   BUN 11  6 -  23 mg/dL   Creatinine, Ser 4.69  0.50 - 1.10 mg/dL   Calcium 7.0 (*) 8.4 - 10.5 mg/dL   GFR calc non Af Amer 89 (*) >90 mL/min   GFR calc Af Amer >90  >90 mL/min   Comment: (NOTE)     The eGFR has been calculated using the CKD EPI equation.     This calculation has not been validated in all clinical situations.     eGFR's persistently <90 mL/min signify possible Chronic Kidney     Disease.  MAGNESIUM     Status: Abnormal   Collection Time    01/23/13  4:17 AM      Result Value Range   Magnesium 2.8 (*) 1.5 - 2.5 mg/dL  POCT I-STAT 3, BLOOD GAS (G3+)     Status: Abnormal   Collection Time    01/23/13  4:17 AM      Result Value Range   pH, Arterial 7.303 (*) 7.350 - 7.450   pCO2 arterial 46.1 (*) 35.0 - 45.0 mmHg   pO2, Arterial 119.0 (*) 80.0 - 100.0 mmHg   Bicarbonate 23.1  20.0 - 24.0 mEq/L   TCO2 25  0 - 100 mmol/L   O2 Saturation 98.0     Acid-base deficit 4.0 (*) 0.0 - 2.0 mmol/L   Patient temperature 36.1 C     Collection site ARTERIAL LINE     Sample type ARTERIAL      Imaging: Imaging results have been reviewed  Assessment/Plan:   1. Principal Problem: 2.   STEMI (ST elevation myocardial infarction) 3. Active Problems: 4.   Tobacco abuse 5.   S/P CABG x 4 6.   Time Spent Directly with Patient:  20 minutes  Length of Stay:  LOS: 1 day   POD #1 Emergency CABG X 4 by Dr/ Tyrone Sage in setting of Ant STEMI with LM/3 VD. Still intubated on pressors. NSR. Exam benign. Lungs clear. Cor RRR. Labs OK. Alert and responsive. EKG shows expected evolution with ALTWI. Cont current Rx per TCTS. Nl progression. Appreciate Dr. Dennie Maizes excellent and expedient care!!!!  Runell Gess 01/23/2013, 7:48 AM

## 2013-01-24 ENCOUNTER — Inpatient Hospital Stay (HOSPITAL_COMMUNITY): Payer: BC Managed Care – PPO

## 2013-01-24 ENCOUNTER — Encounter (HOSPITAL_COMMUNITY): Payer: Self-pay | Admitting: Cardiothoracic Surgery

## 2013-01-24 DIAGNOSIS — I255 Ischemic cardiomyopathy: Secondary | ICD-10-CM | POA: Diagnosis present

## 2013-01-24 DIAGNOSIS — E785 Hyperlipidemia, unspecified: Secondary | ICD-10-CM | POA: Diagnosis present

## 2013-01-24 DIAGNOSIS — I2589 Other forms of chronic ischemic heart disease: Secondary | ICD-10-CM

## 2013-01-24 DIAGNOSIS — R579 Shock, unspecified: Secondary | ICD-10-CM

## 2013-01-24 LAB — GLUCOSE, CAPILLARY
Glucose-Capillary: 102 mg/dL — ABNORMAL HIGH (ref 70–99)
Glucose-Capillary: 108 mg/dL — ABNORMAL HIGH (ref 70–99)
Glucose-Capillary: 109 mg/dL — ABNORMAL HIGH (ref 70–99)
Glucose-Capillary: 109 mg/dL — ABNORMAL HIGH (ref 70–99)
Glucose-Capillary: 109 mg/dL — ABNORMAL HIGH (ref 70–99)
Glucose-Capillary: 113 mg/dL — ABNORMAL HIGH (ref 70–99)
Glucose-Capillary: 114 mg/dL — ABNORMAL HIGH (ref 70–99)
Glucose-Capillary: 115 mg/dL — ABNORMAL HIGH (ref 70–99)
Glucose-Capillary: 121 mg/dL — ABNORMAL HIGH (ref 70–99)
Glucose-Capillary: 123 mg/dL — ABNORMAL HIGH (ref 70–99)
Glucose-Capillary: 125 mg/dL — ABNORMAL HIGH (ref 70–99)
Glucose-Capillary: 127 mg/dL — ABNORMAL HIGH (ref 70–99)
Glucose-Capillary: 129 mg/dL — ABNORMAL HIGH (ref 70–99)
Glucose-Capillary: 137 mg/dL — ABNORMAL HIGH (ref 70–99)
Glucose-Capillary: 153 mg/dL — ABNORMAL HIGH (ref 70–99)
Glucose-Capillary: 233 mg/dL — ABNORMAL HIGH (ref 70–99)
Glucose-Capillary: 242 mg/dL — ABNORMAL HIGH (ref 70–99)
Glucose-Capillary: 86 mg/dL (ref 70–99)
Glucose-Capillary: 91 mg/dL (ref 70–99)
Glucose-Capillary: 99 mg/dL (ref 70–99)

## 2013-01-24 LAB — CBC
HCT: 27.7 % — ABNORMAL LOW (ref 36.0–46.0)
Hemoglobin: 9.4 g/dL — ABNORMAL LOW (ref 12.0–15.0)
MCH: 29.9 pg (ref 26.0–34.0)
MCHC: 33.9 g/dL (ref 30.0–36.0)
MCV: 88.2 fL (ref 78.0–100.0)
Platelets: 89 10*3/uL — ABNORMAL LOW (ref 150–400)
RBC: 3.14 MIL/uL — ABNORMAL LOW (ref 3.87–5.11)
RDW: 15.1 % (ref 11.5–15.5)
WBC: 12.3 10*3/uL — ABNORMAL HIGH (ref 4.0–10.5)

## 2013-01-24 LAB — POCT I-STAT 3, ART BLOOD GAS (G3+)
Acid-base deficit: 3 mmol/L — ABNORMAL HIGH (ref 0.0–2.0)
Acid-base deficit: 4 mmol/L — ABNORMAL HIGH (ref 0.0–2.0)
O2 Saturation: 97 %
O2 Saturation: 88 %
Patient temperature: 98.6
pO2, Arterial: 60 mmHg — ABNORMAL LOW (ref 80.0–100.0)

## 2013-01-24 LAB — BASIC METABOLIC PANEL
BUN: 11 mg/dL (ref 6–23)
CO2: 24 mEq/L (ref 19–32)
Calcium: 7.5 mg/dL — ABNORMAL LOW (ref 8.4–10.5)
Chloride: 107 mEq/L (ref 96–112)
Creatinine, Ser: 0.68 mg/dL (ref 0.50–1.10)
GFR calc Af Amer: >90 mL/min (ref >90)
GFR calc non Af Amer: >90 mL/min (ref >90)
Glucose, Bld: 120 mg/dL — ABNORMAL HIGH (ref 70–99)
Potassium: 3.9 mEq/L (ref 3.5–5.1)
Sodium: 137 mEq/L (ref 135–145)

## 2013-01-24 LAB — POCT ACTIVATED CLOTTING TIME: Activated Clotting Time: 160 seconds

## 2013-01-24 LAB — HEMOGLOBIN A1C
Hgb A1c MFr Bld: 6.6 % — ABNORMAL HIGH (ref <5.7)
Mean Plasma Glucose: 143 mg/dL — ABNORMAL HIGH (ref <117)

## 2013-01-24 MED ORDER — FUROSEMIDE 10 MG/ML IJ SOLN
40.0000 mg | Freq: Once | INTRAMUSCULAR | Status: AC
  Administered 2013-01-24: 40 mg via INTRAVENOUS

## 2013-01-24 MED ORDER — FLUOXETINE HCL 20 MG PO CAPS
40.0000 mg | ORAL_CAPSULE | Freq: Every day | ORAL | Status: DC
  Administered 2013-01-24 – 2013-01-29 (×6): 40 mg via ORAL
  Filled 2013-01-24 (×6): qty 2

## 2013-01-24 MED ORDER — INSULIN ASPART 100 UNIT/ML ~~LOC~~ SOLN
0.0000 [IU] | SUBCUTANEOUS | Status: DC
  Administered 2013-01-24: 8 [IU] via SUBCUTANEOUS
  Administered 2013-01-25: 2 [IU] via SUBCUTANEOUS
  Administered 2013-01-25: 12 [IU] via SUBCUTANEOUS
  Administered 2013-01-25: 4 [IU] via SUBCUTANEOUS
  Administered 2013-01-26: 2 [IU] via SUBCUTANEOUS
  Administered 2013-01-26: 4 [IU] via SUBCUTANEOUS
  Administered 2013-01-26 – 2013-01-27 (×2): 2 [IU] via SUBCUTANEOUS

## 2013-01-24 MED ORDER — ATORVASTATIN CALCIUM 80 MG PO TABS
80.0000 mg | ORAL_TABLET | Freq: Every day | ORAL | Status: DC
  Administered 2013-01-24 – 2013-01-28 (×4): 80 mg via ORAL
  Filled 2013-01-24 (×6): qty 1

## 2013-01-24 MED ORDER — ALPRAZOLAM 0.25 MG PO TABS
1.0000 mg | ORAL_TABLET | Freq: Three times a day (TID) | ORAL | Status: DC | PRN
  Administered 2013-01-24 – 2013-01-29 (×7): 1 mg via ORAL
  Filled 2013-01-24 (×3): qty 4
  Filled 2013-01-24: qty 2
  Filled 2013-01-24 (×2): qty 4
  Filled 2013-01-24: qty 2

## 2013-01-24 MED ORDER — METOCLOPRAMIDE HCL 5 MG/ML IJ SOLN
10.0000 mg | Freq: Four times a day (QID) | INTRAMUSCULAR | Status: AC
  Administered 2013-01-24 – 2013-01-25 (×3): 10 mg via INTRAVENOUS
  Filled 2013-01-24 (×3): qty 2

## 2013-01-24 MED FILL — Mannitol IV Soln 20%: INTRAVENOUS | Qty: 500 | Status: AC

## 2013-01-24 MED FILL — Magnesium Sulfate Inj 50%: INTRAMUSCULAR | Qty: 10 | Status: AC

## 2013-01-24 MED FILL — Heparin Sodium (Porcine) 2 Unit/ML in Sodium Chloride 0.9%: INTRAMUSCULAR | Qty: 2000 | Status: AC

## 2013-01-24 MED FILL — Potassium Chloride Inj 2 mEq/ML: INTRAVENOUS | Qty: 40 | Status: AC

## 2013-01-24 MED FILL — Lidocaine HCl IV Inj 20 MG/ML: INTRAVENOUS | Qty: 5 | Status: AC

## 2013-01-24 MED FILL — Electrolyte-R (PH 7.4) Solution: INTRAVENOUS | Qty: 4000 | Status: AC

## 2013-01-24 MED FILL — Heparin Sodium (Porcine) Inj 1000 Unit/ML: INTRAMUSCULAR | Qty: 10 | Status: AC

## 2013-01-24 MED FILL — Verapamil HCl IV Soln 2.5 MG/ML: INTRAVENOUS | Qty: 2 | Status: AC

## 2013-01-24 MED FILL — Lidocaine HCl Local Preservative Free (PF) Inj 1%: INTRAMUSCULAR | Qty: 30 | Status: AC

## 2013-01-24 MED FILL — Heparin Sodium (Porcine) Inj 1000 Unit/ML: INTRAMUSCULAR | Qty: 30 | Status: AC

## 2013-01-24 MED FILL — Sodium Bicarbonate IV Soln 8.4%: INTRAVENOUS | Qty: 100 | Status: AC

## 2013-01-24 MED FILL — Nitroglycerin IV Soln 200 MCG/ML in D5W: INTRAVENOUS | Qty: 1 | Status: AC

## 2013-01-24 MED FILL — Sodium Chloride IV Soln 0.9%: INTRAVENOUS | Qty: 2000 | Status: AC

## 2013-01-24 NOTE — Op Note (Signed)
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  SURGICAL PROCEDURE:  Emergency coronary artery bypass grafting x4 with the left internal mammary to the left anterior descending coronary artery, reverse saphenous vein graft sequentially to the intermediate coronary artery and distal circumflex, reverse saphenous vein graft to the distal right coronary artery, right thigh and calf Endovein harvesting, placement of left subclavian vein, Swan-Ganz catheter, transesophageal echocardiography .  BRIEF HISTORY:  The patient is a 62 year old female with no known previous history of cardiac disease, who presents with diaphoresis and significant chest pain to [facility] with acute EKG changes of anterior wall myocardial infarction.  She was given a bolus of heparin and transferred urgently to [facility] and underwent cardiac catheterization by Dr. Nanetta Batty.  She was found to have 80-90% distal left main obstruction and a 95 or more obstruction of the LAD with ruptured plaque and clot.  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  She also had 70-80% disease involving the intermediate and distal circumflex branches.  The patient is a long-term smoker with more than 50 pack-year history.  Because of the nature of her blockages and evidence of a STEMI that was not suitable for angioplasty.  Emergency coronary artery bypass grafting was recommended.  The patient was taken directly from the cath lab to the  operating room.  Planned procedure was discussed with her, and she agreed and signed informed consent.  DESCRIPTION OF PROCEDURE:  With a right IJ Swan-Ganz placed by anesthesia in place of a femoral arterial line and radial arterial line, the patient underwent general endotracheal anesthesia without incident. The skin in the chest and legs was prepped with Betadine and draped in usual sterile manner.  Using the [employer] Endovein harvesting system, segment of vein was harvested from the right thigh and calf.  Median sternotomy was performed.  The left internal mammary artery was dissected down as a pedicle graft.  The distal artery was divided, had good free flow.  Pericardium was opened.  Overall, ventricular function appeared depressed globally.  This was confirmed on TEE.  As we were ready to go on bypass, anesthesia had been manipulating the Swan-Ganz catheter as it had not floated all the way into the PA and almost simultaneously lost their 14-gauge peripheral IV and Swan-Ganz access. This left [place] with no vascular access other than the chest.  The patient was hemodynamically stable.  At this point, the left subclavian area was previously prepped.  A 16-gauge needle was introduced into the left subclavian vein and a Swan introducer was placed into this left subclavian vein with good blood return.  This IV was used for the remainder of the  case.  At the completion of the case, a Swan-Ganz catheter was then introduced through the sheath and easily into the pulmonary artery.  We then went on cardiopulmonary bypass.  Sites of anastomosis were inspected and dissected out of the epicardium.  The patient's body temperature was cooled to 32 degrees.  Aortic crossclamp was applied.  It should be noted the patient had areas of thickening of her ascending aorta and calcified areas that we made efforts to avoid. Aortic root vent cardioplegia needle was introduced and cold blood cardioplegia was  administered until 500 mL with rapid diastolic arrest of the heart.  Attention was turned first to the lateral wall of the heart.  The intermediate coronary artery was intramyocardial.  This vessel was opened, was a thin-walled vessel, but did admit a 1.5 mm probe distally.  Using a diamond type side-to-side anastomosis, a running 8-0 Prolene was used to perform the anastomosis with segment of vein.  The distal segment of vein was then carried to the much smaller distal circumflex branch which admitted a 1 mm probe.  Using a running 8- 0 Prolene, distal anastomosis was performed.  The posterior descending, posterior lateral branches of the right coronary artery were very small. The distal right coronary artery did have a lumen to it and the vessel was opened and admitted a 1.5 mm probe distally.  Using a running 7-0 Prolene, distal anastomosis was performed with a segment of the reverse saphenous vein graft.  Attention was then turned to the left anterior descending coronary artery were between the distal and the mid third. The LAD was opened and admitted a 1.5 mm probe distally.  Using a running 8-0 Prolene, the left internal mammary artery was anastomosed to left anterior descending coronary artery.  With the crossclamp still in place, 2 punch aortotomies were performed.  As noted, the patient's ascending aorta was somewhat thickened and we attempted to do the punch aortotomies in the center area.  The ascending aorta was copiously irrigated of any loose calcific debris.  Each of the 2 vein grafts were then anastomosed to the ascending aorta.  Heart was allowed to passively fill and de-aired and the aortic cross clamp with the bulldog on the mammary artery was released with a rapid rise in myocardial septal temperature.  Aortic crossclamp was removed.  The patient spontaneously converted to a slow sinus rhythm.  She did require atrially pacing.  She was loaded with milrinone and dopamine  infusion with the body temperature rewarmed to 37 degrees.  She was then ventilated and weaned from cardiopulmonary bypass with a pressure support.  The TEE showed very trace mitral regurgitation and  overall improvement in her left ventricular function.  She remained hemodynamically stable, was decannulated in usual fashion.  Protamine sulfate was administered with operative field hemostatic atrial and ventricular pacing wires were applied.  The left pleural tube and a Blake mediastinal drain were left in place.  Sternum was closed with #6 stainless steel wire.  Fascia was closed with interrupted 0 Vicryl, running 3-0 Vicryl and subcutaneous tissue, 4-0 subcuticular stitch and skin edges.  Dry dressings were applied.  Sponge and needle count was reported as correct at completion of the procedure.  The patient tolerated the procedure without obvious complication.  She did require a 1 unit of packed red blood cells for a low hematocrit while on bypass.     Sheliah Plane, MD     EG/MEDQ  D:  01/23/2013  T:  01/24/2013  Job:  [id number]

## 2013-01-24 NOTE — Progress Notes (Signed)
Pt. Seen and examined. Agree with the NP/PA-C note as written.  Awake, extubated. No pain. Does not remember surgery. Discussed with Dr. Tyrone Sage. I would recommend starting atorvastatin 80 mg daily.  I agree she will benefit from DAPT eventually after chest tube bleeding has ceased and platelet count rebounds. Wean off pressors as tolerated, hopefully today. She will need diuresis, thereafter. Agree with echo before discharge, given large anterior wall MI.  Chrystie Nose, MD, [facility] Attending Cardiologist [facility]

## 2013-01-24 NOTE — Progress Notes (Signed)
Utilization Review Completed.Dorcas Carrow T12/29/2014

## 2013-01-24 NOTE — Progress Notes (Signed)
01/24/13 extubated per MD orders placed patient on 4L [place] sats 94% patients NIF -40 VT 700. Patient is stable RT will continue to monitor

## 2013-01-24 NOTE — Progress Notes (Signed)
Patient ID: [name], female   DOB: Jan 29, 1950, 62 y.o.   MRN: [id number] TCTS DAILY ICU PROGRESS NOTE                   [address]            [place] [address]          [phone]   2 Days Post-Op Procedure(s) (LRB): Coronary Artery Bypass Grafting Times Four Using Left Internal Mammary Artery and Right Saphenous Leg Vein Harvested Endoscopically (N/A)  Total Length of Stay:  LOS: 2 days   Subjective: Still on vent, weaned to cpap, awake and alert  Objective: Vital signs in last 24 hours: Temp:  [98.8 F (37.1 C)-100.2 F (37.9 C)] 98.8 F (37.1 C) (12/29 0740) Pulse Rate:  [72-91] 72 (12/29 0740) Cardiac Rhythm:  [-] Atrial paced (12/29 0400) Resp:  [12-24] 19 (12/29 0740) BP: (89-105)/(37-54) 101/52 mmHg (12/28 1542) SpO2:  [95 %-99 %] 96 % (12/29 0743) Arterial Line BP: (91-109)/(39-60) 91/39 mmHg (12/28 1130) FiO2 (%):  [40 %-50 %] 40 % (12/29 0743) Weight:  [164 lb 10.9 oz (74.7 kg)] 164 lb 10.9 oz (74.7 kg) (12/29 0500)  Filed Weights   01/22/13 1401 01/23/13 0600 01/24/13 0500  Weight: 154 lb 5.2 oz (70 kg) 160 lb 15 oz (73 kg) 164 lb 10.9 oz (74.7 kg)    Weight change: 10 lb 5.8 oz (4.7 kg)   Hemodynamic parameters for last 24 hours: PAP: (20-37)/(7-24) 36/24 mmHg CO:  [3.7 L/min-4.4 L/min] 4.4 L/min CI:  [2 L/min/m2-2.4 L/min/m2] 2.4 L/min/m2  Intake/Output from previous day: 12/28 0701 - 12/29 0700 In: 2623 [I.V.:2153; NG/GT:170; IV Piggyback:300] Out: 2215 [Urine:1755; Chest Tube:460]  Intake/Output this shift:    Current Meds: Scheduled Meds: . acetaminophen  1,000 mg Oral Q6H   Or  . acetaminophen (TYLENOL) oral liquid 160 mg/5 mL  1,000 mg Per Tube Q6H  . aspirin EC  325 mg Oral Daily   Or  . aspirin  324 mg Per Tube Daily  . bisacodyl  10 mg Oral Daily   Or  . bisacodyl  10 mg Rectal Daily  . cefUROXime (ZINACEF)  IV  1.5 g Intravenous Q12H  . docusate sodium  200 mg Oral Daily  . insulin regular  0-10 Units  Intravenous TID WC  . levalbuterol  0.63 mg Nebulization Q6H  . metoprolol tartrate  12.5 mg Oral BID   Or  . metoprolol tartrate  12.5 mg Per Tube BID  . pantoprazole  40 mg Oral Daily  . sodium chloride  3 mL Intravenous Q12H   Continuous Infusions: . sodium chloride 20 mL/hr at 01/22/13 2255  . sodium chloride 10 mL/hr at 01/22/13 2254  . sodium chloride    . dexmedetomidine 0.3 mcg/kg/hr (01/24/13 0700)  . DOPamine 5 mcg/kg/min (01/24/13 0700)  . insulin (NOVOLIN-R) infusion 1.8 Units/hr (01/24/13 0700)  . lactated ringers 20 mL/hr (01/22/13 2218)  . milrinone 0.125 mcg/kg/min (01/24/13 0700)  . nitroGLYCERIN 5 mcg/min (01/22/13 2145)  . phenylephrine (NEO-SYNEPHRINE) Adult infusion Stopped (01/24/13 0500)   PRN Meds:.metoprolol, midazolam, morphine injection, ondansetron (ZOFRAN) IV, oxyCODONE, sodium chloride  General appearance: alert and cooperative Neurologic: intact Heart: regular rate and rhythm, S1, S2 normal, no murmur, click, rub or gallop Lungs: diminished breath sounds bibasilar Abdomen: soft, non-tender; bowel sounds normal; no masses,  no organomegaly Extremities: extremities normal, atraumatic, no cyanosis or edema and Homans sign is negative, no sign of DVT Wound: sternum  stable  Lab Results: CBC: Recent Labs  01/23/13 1620 01/24/13 0400  WBC 16.3* 12.3*  HGB 10.1* 9.4*  HCT 29.9* 27.7*  PLT 109* 89*   BMET:  Recent Labs  01/23/13 1100 01/23/13 1611 01/23/13 1620 01/24/13 0400  NA 139 142  --  137  K 4.6 4.5  --  3.9  CL 110 106  --  107  CO2 25  --   --  24  GLUCOSE 96 96  --  120*  BUN 11 10  --  11  CREATININE 0.76 0.80 0.72 0.68  CALCIUM 7.4*  --   --  7.5*    PT/INR:  Recent Labs  01/22/13 2200  LABPROT 17.8*  INR 1.51*   Radiology: Dg Chest Port 1 View  01/24/2013   CLINICAL DATA:  Status post CABG.  Evaluate chest tube placement.  EXAM: PORTABLE CHEST - 1 VIEW  COMPARISON:  Chest x-ray 01/23/2013.  FINDINGS: Left-sided chest  tube remains in position with tip in the upper left hemithorax. An endotracheal tube is in place with tip 5.2 cm above the carina. Left-sided subclavian central venous Cordis through which a Swan-Ganz catheter has been passed into the distal pulmonic trunk. Nasogastric tube in position with tip near the gastroesophageal junction and side port in the lower 3rd of the esophagus. Epicardial pacing wires are noted. Lung volumes continued improved, and there are resolving bibasilar opacities, most compatible with resolving areas of mild subsegmental atelectasis. Probable trace left pleural effusion. No appreciable pneumothorax. No evidence of pulmonary edema. Cardiopericardial silhouette is within normal limits. Upper mediastinal contours are unremarkable. Status post median sternotomy for CABG.  IMPRESSION: 1. Postoperative changes and support apparatus, as above. 2. Improving lung volumes with decreasing bibasilar subsegmental atelectasis and trace left pleural effusion.   Electronically Signed   By: Trudie Reed M.D.   On: 01/24/2013 07:48   Dg Chest Portable 1 View In Am  01/23/2013   CLINICAL DATA:  Status post CABG.  EXAM: PORTABLE CHEST - 1 VIEW  COMPARISON:  01/22/2013  FINDINGS: Endotracheal tube remains with the tip approximately 3.5 cm above the carina. Swan-Ganz catheter tip lies in the main pulmonary artery. Left chest tube present. No pneumothorax identified. Bibasilar atelectasis. No significant pleural effusions. No overt edema. The heart size and mediastinal contours are stable.  IMPRESSION: No pneumothorax.  Mild bibasilar atelectasis.   Electronically Signed   By: Irish Lack M.D.   On: 01/23/2013 07:58   Dg Chest Portable 1 View  01/22/2013   CLINICAL DATA:  Coronary bypass  EXAM: PORTABLE CHEST - 1 VIEW  COMPARISON:  None.  FINDINGS: Endotracheal tube 5.5 cm above the carina. NG tube at the gastroesophageal junction and can be advanced 5 cm into the stomach. Swan-Ganz catheter in the  pulmonary outflow tract centrally from a left subclavian approach. Left chest tube in place. Normal heart size with mild interstitial prominence versus edema. Minimal basilar atelectasis. No effusion or pneumothorax.  IMPRESSION: Mild interstitial prominence versus early edema  Basilar atelectasis  No effusion or pneumothorax.   Electronically Signed   By: Ruel Favors M.D.   On: 01/22/2013 21:57     Assessment/Plan: S/P Procedure(s) (LRB): Coronary Artery Bypass Grafting Times Four Using Left Internal Mammary Artery and Right Saphenous Leg Vein Harvested Endoscopically (N/A) Mobilize Diuresis d/c tubes/lines Continue foley due to strict I&O, patient critically ill, patient in ICU and urinary output monitoring See progression orders plan extubation- 50 pack year of smoking Decrease dopamine  to 3 and wean off milrinone, now off neo thrombocytopenia avoid heparin until back up   Brennyn Haisley B 01/24/2013 8:35 AM

## 2013-01-24 NOTE — Progress Notes (Signed)
Patient ID: [name], female   DOB: 1950/11/15, 62 y.o.   MRN: [id number] SICU Evening Rounds:  Hemodynamics stable but BP is borderline with MAP 60. Milrinone is off. Still on low dose dopamine. Will hold off on lopressor for now.  Diuresed well today.  Sats better. Now 95%  Up in chair eating.  BMET    Component Value Date/Time   NA 137 01/24/2013 0400   K 3.9 01/24/2013 0400   CL 107 01/24/2013 0400   CO2 24 01/24/2013 0400   GLUCOSE 120* 01/24/2013 0400   BUN 11 01/24/2013 0400   CREATININE 0.68 01/24/2013 0400   CALCIUM 7.5* 01/24/2013 0400   GFRNONAA >90 01/24/2013 0400   GFRAA >90 01/24/2013 0400    CBC    Component Value Date/Time   WBC 12.3* 01/24/2013 0400   RBC 3.14* 01/24/2013 0400   HGB 9.4* 01/24/2013 0400   HCT 27.7* 01/24/2013 0400   PLT 89* 01/24/2013 0400   MCV 88.2 01/24/2013 0400   MCH 29.9 01/24/2013 0400   MCHC 33.9 01/24/2013 0400   RDW 15.1 01/24/2013 0400    A/P: stable. Continue present plan.

## 2013-01-24 NOTE — Progress Notes (Signed)
Subjective:  Intubated, awake, alert  Objective:  Vital Signs in the last 24 hours: Temp:  [98.8 F (37.1 C)-100.2 F (37.9 C)] 98.8 F (37.1 C) (12/29 0740) Pulse Rate:  [72-91] 72 (12/29 0740) Resp:  [12-24] 19 (12/29 0740) BP: (89-105)/(37-54) 101/52 mmHg (12/28 1542) SpO2:  [95 %-99 %] 96 % (12/29 0743) Arterial Line BP: (91-109)/(39-60) 91/39 mmHg (12/28 1130) FiO2 (%):  [40 %-50 %] 40 % (12/29 0743) Weight:  [164 lb 10.9 oz (74.7 kg)] 164 lb 10.9 oz (74.7 kg) (12/29 0500)  Intake/Output from previous day:  Intake/Output Summary (Last 24 hours) at 01/24/13 0829 Last data filed at 01/24/13 0700  Gross per 24 hour  Intake 2404.3 ml  Output   2060 ml  Net  344.3 ml    Physical Exam: General appearance: cooperative and intubated, edematous Lungs: decreased breath sounds Heart: regular rate and rhythm   Rate: 74  Rhythm: normal sinus rhythm  Lab Results:  Recent Labs  01/23/13 1620 01/24/13 0400  WBC 16.3* 12.3*  HGB 10.1* 9.4*  PLT 109* 89*    Recent Labs  01/23/13 1100 01/23/13 1611 01/23/13 1620 01/24/13 0400  NA 139 142  --  137  K 4.6 4.5  --  3.9  CL 110 106  --  107  CO2 25  --   --  24  GLUCOSE 96 96  --  120*  BUN 11 10  --  11  CREATININE 0.76 0.80 0.72 0.68    Recent Labs  01/22/13 1345  TROPONINI 1.62*    Recent Labs  01/22/13 2200  INR 1.51*    Imaging: Imaging results have been reviewed  Cardiac Studies:  Assessment/Plan:   Principal Problem:   STEMI 01/22/13 Active Problems:   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 She is still on pressors. She will need an echo pre discharge for LVF.   Corine Shelter PA-C Beeper [phone] 01/24/2013, 8:29 AM

## 2013-01-25 ENCOUNTER — Inpatient Hospital Stay (HOSPITAL_COMMUNITY): Payer: BC Managed Care – PPO

## 2013-01-25 LAB — BASIC METABOLIC PANEL
BUN: 13 mg/dL (ref 6–23)
CO2: 26 mEq/L (ref 19–32)
Calcium: 8 mg/dL — ABNORMAL LOW (ref 8.4–10.5)
Chloride: 104 mEq/L (ref 96–112)
Creatinine, Ser: 0.73 mg/dL (ref 0.50–1.10)
GFR calc Af Amer: >90 mL/min (ref >90)
GFR calc non Af Amer: 90 mL/min — ABNORMAL LOW (ref >90)
Glucose, Bld: 107 mg/dL — ABNORMAL HIGH (ref 70–99)
Potassium: 4.1 mEq/L (ref 3.7–5.3)
Sodium: 140 mEq/L (ref 137–147)

## 2013-01-25 LAB — CBC
HCT: 27.9 % — ABNORMAL LOW (ref 36.0–46.0)
Hemoglobin: 9.4 g/dL — ABNORMAL LOW (ref 12.0–15.0)
MCH: 29.9 pg (ref 26.0–34.0)
MCHC: 33.7 g/dL (ref 30.0–36.0)
MCV: 88.9 fL (ref 78.0–100.0)
Platelets: 80 10*3/uL — ABNORMAL LOW (ref 150–400)
RBC: 3.14 MIL/uL — ABNORMAL LOW (ref 3.87–5.11)
RDW: 15.1 % (ref 11.5–15.5)
WBC: 9.9 10*3/uL (ref 4.0–10.5)

## 2013-01-25 LAB — GLUCOSE, CAPILLARY
Glucose-Capillary: 105 mg/dL — ABNORMAL HIGH (ref 70–99)
Glucose-Capillary: 107 mg/dL — ABNORMAL HIGH (ref 70–99)
Glucose-Capillary: 117 mg/dL — ABNORMAL HIGH (ref 70–99)
Glucose-Capillary: 123 mg/dL — ABNORMAL HIGH (ref 70–99)
Glucose-Capillary: 82 mg/dL (ref 70–99)
Glucose-Capillary: 97 mg/dL (ref 70–99)

## 2013-01-25 LAB — POCT I-STAT, CHEM 8
BUN: 16 mg/dL (ref 6–23)
Chloride: 103 mEq/L (ref 96–112)
Glucose, Bld: 222 mg/dL — ABNORMAL HIGH (ref 70–99)
HCT: 44 % (ref 36.0–46.0)
Potassium: 3.5 mEq/L (ref 3.7–5.3)

## 2013-01-25 MED ORDER — POTASSIUM CHLORIDE CRYS ER 20 MEQ PO TBCR
20.0000 meq | EXTENDED_RELEASE_TABLET | Freq: Once | ORAL | Status: AC
  Administered 2013-01-25: 20 meq via ORAL
  Filled 2013-01-25: qty 1

## 2013-01-25 MED ORDER — INSULIN DETEMIR 100 UNIT/ML ~~LOC~~ SOLN
6.0000 [IU] | Freq: Every day | SUBCUTANEOUS | Status: DC
  Administered 2013-01-25: 6 [IU] via SUBCUTANEOUS
  Filled 2013-01-25: qty 0.06

## 2013-01-25 MED ORDER — FUROSEMIDE 10 MG/ML IJ SOLN
20.0000 mg | Freq: Two times a day (BID) | INTRAMUSCULAR | Status: AC
  Administered 2013-01-25 (×2): 20 mg via INTRAVENOUS
  Filled 2013-01-25: qty 2

## 2013-01-25 MED ORDER — LEVALBUTEROL HCL 0.63 MG/3ML IN NEBU
0.6300 mg | INHALATION_SOLUTION | Freq: Three times a day (TID) | RESPIRATORY_TRACT | Status: DC
  Administered 2013-01-26 – 2013-01-27 (×4): 0.63 mg via RESPIRATORY_TRACT
  Filled 2013-01-25 (×6): qty 3

## 2013-01-25 MED ORDER — LEVALBUTEROL HCL 0.63 MG/3ML IN NEBU: 0.6300 mg | INHALATION_SOLUTION | Freq: Four times a day (QID) | RESPIRATORY_TRACT | Status: DC | PRN

## 2013-01-25 NOTE — Progress Notes (Signed)
Pt. Seen and examined. Agree with the NP/PA-C note as written.  Weaning down dopamine, hopefully to off today. Chest tube output is minimal - platelets slightly lower at 80K today. H/H stable. Agree on holding DAPT therapy until platelets rebound.  Check echo before discharge.  Chrystie Nose, MD, [facility] Attending Cardiologist [facility]

## 2013-01-25 NOTE — Progress Notes (Signed)
Has had a good day Ambulated, off dopamine and milrinone Eating dinner Denies pain BP 120/69  Pulse 95  Temp(Src) 97.9 F (36.6 C) (Oral)  Resp 18  Ht 5\' 6"  (1.676 m)  Wt 163 lb 2.3 oz (74 kg)  BMI 26.34 kg/m2  SpO2 96%   Intake/Output Summary (Last 24 hours) at 01/25/13 1816 Last data filed at 01/25/13 1500  Gross per 24 hour  Intake  935.7 ml  Output   2770 ml  Net -1834.3 ml    Continue current care

## 2013-01-25 NOTE — Progress Notes (Signed)
Subjective: Out of bed and sitting in a chair. Denies CP. She is mildly SOB. Has been using incentive spirometer.  Objective: Vital signs in last 24 hours: Temp:  [98.2 F (36.8 C)-99 F (37.2 C)] 98.2 F (36.8 C) (12/30 0400) Pulse Rate:  [73-95] 95 (12/30 0700) Resp:  [7-30] 19 (12/30 0700) BP: (61-123)/(35-75) 113/51 mmHg (12/30 0700) SpO2:  [91 %-98 %] 92 % (12/30 0700) Weight:  [74 kg (163 lb 2.3 oz)] 74 kg (163 lb 2.3 oz) (12/30 0500)    Intake/Output from previous day: 12/29 0701 - 12/30 0700 In: 1264.1 [P.O.:340; I.V.:924.1] Out: 2895 [Urine:2845; Chest Tube:50] Intake/Output this shift:    Medications Current Facility-Administered Medications  Medication Dose Route Frequency Provider Last Rate Last Dose  . 0.45 % sodium chloride infusion   Intravenous Continuous Rowe Clack, PA-C 20 mL/hr at 01/22/13 2255    . 0.9 %  sodium chloride infusion   Intravenous Continuous Rowe Clack, PA-C 10 mL/hr at 01/22/13 2254    . 0.9 %  sodium chloride infusion  250 mL Intravenous Continuous Wayne E Gold, PA-C      . acetaminophen (TYLENOL) tablet 1,000 mg  1,000 mg Oral Q6H Wayne E Gold, PA-C   1,000 mg at 01/25/13 4098   Or  . acetaminophen (TYLENOL) solution 1,000 mg  1,000 mg Per Tube Q6H Wayne E Gold, PA-C   1,000 mg at 01/24/13 0521  . ALPRAZolam Prudy Feeler) tablet 1 mg  1 mg Oral TID PRN Delight Ovens, MD   1 mg at 01/25/13 0344  . aspirin EC tablet 325 mg  325 mg Oral Daily Rowe Clack, PA-C   325 mg at 01/24/13 1357   Or  . aspirin chewable tablet 324 mg  324 mg Per Tube Daily Rowe Clack, PA-C   324 mg at 01/23/13 1191  . atorvastatin (LIPITOR) tablet 80 mg  80 mg Oral q1800 Chrystie Nose, MD   80 mg at 01/24/13 1718  . bisacodyl (DULCOLAX) EC tablet 10 mg  10 mg Oral Daily Rowe Clack, PA-C   10 mg at 01/24/13 1357   Or  . bisacodyl (DULCOLAX) suppository 10 mg  10 mg Rectal Daily Rowe Clack, PA-C   10 mg at 01/23/13 0955  . docusate sodium (COLACE) capsule  200 mg  200 mg Oral Daily Wayne E Gold, PA-C   200 mg at 01/24/13 1357  . DOPamine (INTROPIN) 800 mg in dextrose 5 % 250 mL infusion  2.5 mcg/kg/min Intravenous Continuous Delight Ovens, MD 3.9 mL/hr at 01/25/13 0700 3 mcg/kg/min at 01/25/13 0700  . FLUoxetine (PROZAC) capsule 40 mg  40 mg Oral Daily Delight Ovens, MD   40 mg at 01/24/13 1719  . furosemide (LASIX) injection 20 mg  20 mg Intravenous BID Donielle M Zimmerman, PA-C      . insulin aspart (novoLOG) injection 0-24 Units  0-24 Units Subcutaneous Q4H Delight Ovens, MD   8 Units at 01/24/13 2008  . insulin detemir (LEVEMIR) injection 6 Units  6 Units Subcutaneous QHS Donielle M Zimmerman, PA-C      . insulin regular (NOVOLIN R,HUMULIN R) 1 Units/mL in sodium chloride 0.9 % 100 mL infusion   Intravenous Continuous Wayne E Gold, PA-C      . insulin regular bolus via infusion 0-10 Units  0-10 Units Intravenous TID WC Wayne E Gold, PA-C      . lactated ringers infusion   Intravenous Continuous Wayne E  Gold, PA-C 20 mL/hr at 01/25/13 0023    . levalbuterol (XOPENEX) nebulizer solution 0.63 mg  0.63 mg Nebulization Q6H Delight Ovens, MD   0.63 mg at 01/25/13 0125  . metoprolol (LOPRESSOR) injection 2.5-5 mg  2.5-5 mg Intravenous Q2H PRN Wayne E Gold, PA-C      . morphine 2 MG/ML injection 2-5 mg  2-5 mg Intravenous Q1H PRN Wayne E Gold, PA-C   2 mg at 01/24/13 1358  . nitroGLYCERIN 0.2 mg/mL in dextrose 5 % infusion  0-100 mcg/min Intravenous Continuous Rowe Clack, PA-C 1.5 mL/hr at 01/22/13 2145 5 mcg/min at 01/22/13 2145  . ondansetron (ZOFRAN) injection 4 mg  4 mg Intravenous Q6H PRN Rowe Clack, PA-C   4 mg at 01/24/13 1523  . oxyCODONE (Oxy IR/ROXICODONE) immediate release tablet 5-10 mg  5-10 mg Oral Q3H PRN Rowe Clack, PA-C   10 mg at 01/25/13 0334  . pantoprazole (PROTONIX) EC tablet 40 mg  40 mg Oral Daily Wayne E Gold, PA-C   40 mg at 01/24/13 1357  . phenylephrine (NEO-SYNEPHRINE) 20 mg in dextrose 5 % 250 mL  infusion  0-100 mcg/min Intravenous Continuous Wayne E Gold, PA-C   5 mcg/min at 01/24/13 0400  . potassium chloride SA (K-DUR,KLOR-CON) CR tablet 20 mEq  20 mEq Oral Once Donielle M Zimmerman, PA-C      . sodium chloride 0.9 % injection 3 mL  3 mL Intravenous Q12H Wayne E Gold, PA-C   3 mL at 01/24/13 2200  . sodium chloride 0.9 % injection 3 mL  3 mL Intravenous PRN Rowe Clack, PA-C        PE: General appearance: alert, cooperative and no distress Lungs: decrease BS bilaterally Heart: regular rate and rhythm Extremities: no LEE Pulses: 2+ and symmetric Skin: distal extremities are cool to touch Neurologic: Grossly normal  Lab Results:   Recent Labs  01/23/13 1620 01/24/13 0400 01/25/13 0340  WBC 16.3* 12.3* 9.9  HGB 10.1* 9.4* 9.4*  HCT 29.9* 27.7* 27.9*  PLT 109* 89* 80*   BMET  Recent Labs  01/23/13 1100 01/23/13 1611 01/23/13 1620 01/24/13 0400 01/25/13 0340  NA 139 142  --  137 140  K 4.6 4.5  --  3.9 4.1  CL 110 106  --  107 104  CO2 25  --   --  24 26  GLUCOSE 96 96  --  120* 107*  BUN 11 10  --  11 13  CREATININE 0.76 0.80 0.72 0.68 0.73  CALCIUM 7.4*  --   --  7.5* 8.0*   PT/INR  Recent Labs  01/22/13 2200  LABPROT 17.8*  INR 1.51*   Cholesterol  Recent Labs  01/22/13 1345  CHOL 273*   Cardiac Panel (last 3 results)  Recent Labs  01/22/13 1345  CKTOTAL 101  CKMB 11.0*  TROPONINI 1.62*  RELINDX 10.9*    Assessment/Plan  Principal Problem:   STEMI 01/22/13 Active Problems:   Tobacco abuse   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 Hemodynamically stable. Still on Dopamine. Plan per TCTS is to wean off Dopamine today. NSR. No post-operative arrhthymias captured on telemetry. I/Os +. Continue 20 mg IV Lasix BID for diuresis. Electrolytes stable.  On ASA. Statin initiated yesterday. Platelets continue to trend downward  at 80K. Will not initiate DAPT  until platelet count improves. Will initiate BB and ACE-I once appropriate. Will continue to follow.     LOS: 3 days    Adron Geisel M. Sharol Harness, PA-C 01/25/2013 8:07 AM

## 2013-01-25 NOTE — Progress Notes (Addendum)
TCTS DAILY ICU PROGRESS NOTE                   [address]            [place] [address]          [phone]   3 Days Post-Op Procedure(s) (LRB): Coronary Artery Bypass Grafting Times Four Using Left Internal Mammary Artery and Right Saphenous Leg Vein Harvested Endoscopically (N/A)  Total Length of Stay:  LOS: 3 days   Subjective: Patient sitting in chair. She has "occasional smokers cough".  Objective: Vital signs in last 24 hours: Temp:  [98.2 F (36.8 C)-99 F (37.2 C)] 98.2 F (36.8 C) (12/30 0400) Pulse Rate:  [72-95] 95 (12/30 0700) Cardiac Rhythm:  [-] Normal sinus rhythm (12/30 0400) Resp:  [7-30] 19 (12/30 0700) BP: (61-123)/(35-75) 113/51 mmHg (12/30 0700) SpO2:  [91 %-98 %] 92 % (12/30 0700) FiO2 (%):  [40 %] 40 % (12/29 0800) Weight:  [74 kg (163 lb 2.3 oz)] 74 kg (163 lb 2.3 oz) (12/30 0500)  Filed Weights   01/23/13 0600 01/24/13 0500 01/25/13 0500  Weight: 73 kg (160 lb 15 oz) 74.7 kg (164 lb 10.9 oz) 74 kg (163 lb 2.3 oz)    Weight change: -0.7 kg (-1 lb 8.7 oz)   Hemodynamic parameters for last 24 hours: PAP: (37-52)/(18-31) 43/23 mmHg  Intake/Output from previous day: 12/29 0701 - 12/30 0700 In: 1264.1 [P.O.:340; I.V.:924.1] Out: 2895 [Urine:2845; Chest Tube:50]  Intake/Output this shift:    Current Meds: Scheduled Meds: . acetaminophen  1,000 mg Oral Q6H   Or  . acetaminophen (TYLENOL) oral liquid 160 mg/5 mL  1,000 mg Per Tube Q6H  . aspirin EC  325 mg Oral Daily   Or  . aspirin  324 mg Per Tube Daily  . atorvastatin  80 mg Oral q1800  . bisacodyl  10 mg Oral Daily   Or  . bisacodyl  10 mg Rectal Daily  . docusate sodium  200 mg Oral Daily  . FLUoxetine  40 mg Oral Daily  . insulin aspart  0-24 Units Subcutaneous Q4H  . insulin regular  0-10 Units Intravenous TID WC  . levalbuterol  0.63 mg Nebulization Q6H  . pantoprazole  40 mg Oral Daily  . sodium chloride  3 mL Intravenous Q12H   Continuous Infusions: .  sodium chloride 20 mL/hr at 01/22/13 2255  . sodium chloride 10 mL/hr at 01/22/13 2254  . sodium chloride    . DOPamine 3 mcg/kg/min (01/25/13 0700)  . insulin (NOVOLIN-R) infusion Stopped (01/24/13 1500)  . lactated ringers 20 mL/hr at 01/25/13 0023  . nitroGLYCERIN 5 mcg/min (01/22/13 2145)  . phenylephrine (NEO-SYNEPHRINE) Adult infusion Stopped (01/24/13 0500)   PRN Meds:.ALPRAZolam, metoprolol, morphine injection, ondansetron (ZOFRAN) IV, oxyCODONE, sodium chloride  General appearance: alert, cooperative and no distress Neurologic: intact Heart: regular rate and rhythm Lungs: Diminished at bases L>R, no rhonchi Abdomen: soft, non-tender; bowel sounds normal; no masses,  no organomegaly Extremities: Bilateral LE edema R>L, RLE wounds clean and dry Wound: Sternal dressing intact  Lab Results: CBC: Recent Labs  01/24/13 0400 01/25/13 0340  WBC 12.3* 9.9  HGB 9.4* 9.4*  HCT 27.7* 27.9*  PLT 89* 80*   BMET:  Recent Labs  01/24/13 0400 01/25/13 0340  NA 137 140  K 3.9 4.1  CL 107 104  CO2 24 26  GLUCOSE 120* 107*  BUN 11 13  CREATININE 0.68 0.73  CALCIUM 7.5* 8.0*  PT/INR:  Recent Labs  01/22/13 2200  LABPROT 17.8*  INR 1.51*   Radiology: Dg Chest Port 1 View  01/24/2013   CLINICAL DATA:  Status post CABG.  Evaluate chest tube placement.  EXAM: PORTABLE CHEST - 1 VIEW  COMPARISON:  Chest x-ray 01/23/2013.  FINDINGS: Left-sided chest tube remains in position with tip in the upper left hemithorax. An endotracheal tube is in place with tip 5.2 cm above the carina. Left-sided subclavian central venous Cordis through which a Swan-Ganz catheter has been passed into the distal pulmonic trunk. Nasogastric tube in position with tip near the gastroesophageal junction and side port in the lower 3rd of the esophagus. Epicardial pacing wires are noted. Lung volumes continued improved, and there are resolving bibasilar opacities, most compatible with resolving areas of mild  subsegmental atelectasis. Probable trace left pleural effusion. No appreciable pneumothorax. No evidence of pulmonary edema. Cardiopericardial silhouette is within normal limits. Upper mediastinal contours are unremarkable. Status post median sternotomy for CABG.  IMPRESSION: 1. Postoperative changes and support apparatus, as above. 2. Improving lung volumes with decreasing bibasilar subsegmental atelectasis and trace left pleural effusion.   Electronically Signed   By: Trudie Reed M.D.   On: 01/24/2013 07:48     Assessment/Plan: S/P Procedure(s) (LRB): Coronary Artery Bypass Grafting Times Four Using Left Internal Mammary Artery and Right Saphenous Leg Vein Harvested Endoscopically (N/A)  1.CV-S/p STEMI.SR. Weaned off Milrinone gttp yesterday.On Dopamine gttp-wean as tolerates. Lopressor to begin soon-BP labile. 2.Pulmonary-History of COPD.CXR this am appears to show no pneumothorax, bibasilar atelectasis, small left pleural effusion. Encourage incentive spirometer 3.Volume overload-gentle diurese today 4.ABL anemia - H and H stable 9.4 and 27.9 5.Remove a line 6.Thrombocytopenia-platelets 80,000 7. DM-CBGs 60/107/105 . Pre op HGA1C 7. Will start once daily low dose Levemir. Once tolerating po better, will start Metformin. 8. Remove a cell sternal dressing later today  ZIMMERMAN,DONIELLE M PA-C 01/25/2013 7:28 AM  Chart reviewed, patient examined, agree with above. She is doing well overall. Will wean dopamine today. Continue gentle diuresis. Remove foley after diuresis today. Continue IS.

## 2013-01-26 LAB — CBC
HCT: 26 % — ABNORMAL LOW (ref 36.0–46.0)
Hemoglobin: 8.8 g/dL — ABNORMAL LOW (ref 12.0–15.0)
Platelets: 131 10*3/uL — ABNORMAL LOW (ref 150–400)
RBC: 2.94 MIL/uL — ABNORMAL LOW (ref 3.87–5.11)
RDW: 14.8 % (ref 11.5–15.5)
WBC: 11.7 10*3/uL — ABNORMAL HIGH (ref 4.0–10.5)

## 2013-01-26 LAB — GLUCOSE, CAPILLARY
Glucose-Capillary: 92 mg/dL (ref 70–99)
Glucose-Capillary: 99 mg/dL (ref 70–99)
Glucose-Capillary: 169 mg/dL — ABNORMAL HIGH (ref 70–99)
Glucose-Capillary: 122 mg/dL — ABNORMAL HIGH (ref 70–99)

## 2013-01-26 MED ORDER — FERROUS SULFATE 325 (65 FE) MG PO TABS
325.0000 mg | ORAL_TABLET | Freq: Every day | ORAL | Status: DC
  Administered 2013-01-27 – 2013-01-29 (×3): 325 mg via ORAL
  Filled 2013-01-26 (×4): qty 1

## 2013-01-26 MED ORDER — FOLIC ACID 1 MG PO TABS
1.0000 mg | ORAL_TABLET | Freq: Every day | ORAL | Status: DC
  Administered 2013-01-26 – 2013-01-29 (×4): 1 mg via ORAL
  Filled 2013-01-26 (×4): qty 1

## 2013-01-26 MED ORDER — GUAIFENESIN ER 600 MG PO TB12
600.0000 mg | ORAL_TABLET | Freq: Two times a day (BID) | ORAL | Status: DC
  Administered 2013-01-26 – 2013-01-29 (×7): 600 mg via ORAL
  Filled 2013-01-26 (×8): qty 1

## 2013-01-26 MED ORDER — METFORMIN HCL 500 MG PO TABS
500.0000 mg | ORAL_TABLET | Freq: Two times a day (BID) | ORAL | Status: DC
  Administered 2013-01-26 – 2013-01-29 (×6): 500 mg via ORAL
  Filled 2013-01-26 (×9): qty 1

## 2013-01-26 NOTE — Progress Notes (Signed)
Subjective:  Day 4 s/p emergent CABG x 4  Objective:   Vital Signs in the last 24 hours: Temp:  [97.5 F (36.4 C)-98.8 F (37.1 C)] 98.3 F (36.8 C) (12/31 0744) Pulse Rate:  [73-100] 97 (12/31 0700) Resp:  [15-31] 29 (12/31 0700) BP: (60-126)/(36-69) 84/55 mmHg (12/31 0700) SpO2:  [92 %-99 %] 95 % (12/31 0700) Weight:  [152 lb 8.9 oz (69.2 kg)] 152 lb 8.9 oz (69.2 kg) (12/31 0500)  Intake/Output from previous day: 12/30 0701 - 12/31 0700 In: 685 [P.O.:180; I.V.:505] Out: 2777 [Urine:2775; Stool:2]  Medications: . acetaminophen  1,000 mg Oral Q6H   Or  . acetaminophen (TYLENOL) oral liquid 160 mg/5 mL  1,000 mg Per Tube Q6H  . aspirin EC  325 mg Oral Daily   Or  . aspirin  324 mg Per Tube Daily  . atorvastatin  80 mg Oral q1800  . bisacodyl  10 mg Oral Daily   Or  . bisacodyl  10 mg Rectal Daily  . docusate sodium  200 mg Oral Daily  . [START ON 01/27/2013] ferrous sulfate  325 mg Oral Q breakfast  . FLUoxetine  40 mg Oral Daily  . folic acid  1 mg Oral Daily  . guaiFENesin  600 mg Oral BID  . insulin aspart  0-24 Units Subcutaneous Q4H  . insulin regular  0-10 Units Intravenous TID WC  . levalbuterol  0.63 mg Nebulization TID  . metFORMIN  500 mg Oral BID WC  . pantoprazole  40 mg Oral Daily  . sodium chloride  3 mL Intravenous Q12H    . sodium chloride 20 mL/hr at 01/22/13 2255  . sodium chloride 10 mL/hr at 01/22/13 2254  . sodium chloride    . insulin (NOVOLIN-R) infusion Stopped (01/24/13 1500)  . lactated ringers 20 mL/hr at 01/26/13 0400    Physical Exam:   General appearance: alert, cooperative and no distress Neck: no adenopathy, no JVD, supple, symmetrical, trachea midline and thyroid not enlarged, symmetric, no tenderness/mass/nodules Lungs: no wheezing; decreased BS at at bses Heart: regular rate and rhythm, no rub and 1/6 sem Abdomen: soft, non-tender; bowel sounds normal; no masses,  no organomegaly Extremities: no edema, redness or  tenderness in the calves or thighs Pulses: 2+ and symmetric Neurologic: Grossly normal   Rate:96  Rhythm: normal sinus rhythm  Lab Results:    Recent Labs  01/24/13 0400 01/25/13 0340  NA 137 140  K 3.9 4.1  CL 107 104  CO2 24 26  GLUCOSE 120* 107*  BUN 11 13  CREATININE 0.68 0.73   No results found for this basename: TROPONINI, CK, MB,  in the last 72 hours Hepatic Function Panel No results found for this basename: PROT, ALBUMIN, AST, ALT, ALKPHOS, BILITOT, BILIDIR, IBILI,  in the last 72 hours No results found for this basename: INR,  in the last 72 hours BNP (last 3 results) No results found for this basename: PROBNP,  in the last 8760 hours  Lipid Panel     Component Value Date/Time   CHOL 273* 01/22/2013 1345   TRIG 196* 01/22/2013 1345   HDL 32* 01/22/2013 1345   CHOLHDL 8.5 01/22/2013 1345   VLDL 39 01/22/2013 1345   LDLCALC 202* 01/22/2013 1345   CBC    Component Value Date/Time   WBC 11.7* 01/26/2013 0429   RBC 2.94* 01/26/2013 0429   HGB 8.8* 01/26/2013 0429   HCT 26.0* 01/26/2013 0429   PLT 131* 01/26/2013 0429  MCV 88.4 01/26/2013 0429   MCH 29.9 01/26/2013 0429   MCHC 33.8 01/26/2013 0429   RDW 14.8 01/26/2013 0429      Imaging:  Dg Chest Port 1 View  01/25/2013   CLINICAL DATA:  Patient is status post coronary artery bypass grafting  EXAM: PORTABLE CHEST - 1 VIEW  COMPARISON:  01/24/2013  FINDINGS: The patient's Swan-Ganz catheter has been removed. The Cordis is appreciated within the left subclavian vein. The endotracheal tube and NG tube removed in the interim. The patient's left-sided chest tube has been removed. There is no evidence of pneumothorax. Patient is status post venous sternotomy and coronary artery bypass grafting. There is prominence of the interstitial markings and peribronchial cuffing. An area of increased density projects in the left lung base. There is blunting of the left costophrenic angle. The cardiac silhouette is  moderately enlarged. The osseous structures are unremarkable. Atherosclerotic calcifications are identified within the aorta.  IMPRESSION: Interstitial infiltrate likely representing pulmonary edema. Atelectasis versus focal infiltrate left lung base worse posterior area were least a component of asymmetric edema. Small left effusion is also a diagnostic consideration. There is no evidence of an appreciable pneumothorax. Support lines and tubes have been removed as described above.   Electronically Signed   By: Salome Holmes M.D.   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  Stable hemodynamics although BP still soft at 96 systolic presently. Now on statin for marked hyperlipidemia. Plts improved today to 131K.     Lennette Bihari, MD, [facility] 01/26/2013, 8:24 AM

## 2013-01-26 NOTE — Progress Notes (Signed)
Patient ID: [name], female   DOB: 05/25/1950, 62 y.o.   MRN: [id number]  SICU Evening Rounds:  Hemodynamically stable  Ambulated several times  Urine output good  Stable day.

## 2013-01-26 NOTE — Progress Notes (Addendum)
TCTS DAILY ICU PROGRESS NOTE                   [address]            [place] [address]          [phone]   4 Days Post-Op Procedure(s) (LRB): Coronary Artery Bypass Grafting Times Four Using Left Internal Mammary Artery and Right Saphenous Leg Vein Harvested Endoscopically (N/A)  Total Length of Stay:  LOS: 4 days   Subjective: Patient with cough, at times, productive.  Objective: Vital signs in last 24 hours: Temp:  [97.5 F (36.4 C)-98.8 F (37.1 C)] 98.8 F (37.1 C) (12/31 0400) Pulse Rate:  [73-100] 97 (12/31 0700) Cardiac Rhythm:  [-] Normal sinus rhythm (12/31 0600) Resp:  [15-31] 29 (12/31 0700) BP: (60-126)/(36-69) 84/55 mmHg (12/31 0700) SpO2:  [92 %-99 %] 95 % (12/31 0700) Weight:  [69.2 kg (152 lb 8.9 oz)] 69.2 kg (152 lb 8.9 oz) (12/31 0500)  Filed Weights   01/24/13 0500 01/25/13 0500 01/26/13 0500  Weight: 74.7 kg (164 lb 10.9 oz) 74 kg (163 lb 2.3 oz) 69.2 kg (152 lb 8.9 oz)   Weight change: -4.8 kg (-10 lb 9.3 oz)      Intake/Output from previous day: 12/30 0701 - 12/31 0700 In: 685 [P.O.:180; I.V.:505] Out: 2777 [Urine:2775; Stool:2]      Current Meds: Scheduled Meds: . acetaminophen  1,000 mg Oral Q6H   Or  . acetaminophen (TYLENOL) oral liquid 160 mg/5 mL  1,000 mg Per Tube Q6H  . aspirin EC  325 mg Oral Daily   Or  . aspirin  324 mg Per Tube Daily  . atorvastatin  80 mg Oral q1800  . bisacodyl  10 mg Oral Daily   Or  . bisacodyl  10 mg Rectal Daily  . docusate sodium  200 mg Oral Daily  . FLUoxetine  40 mg Oral Daily  . insulin aspart  0-24 Units Subcutaneous Q4H  . insulin detemir  6 Units Subcutaneous QHS  . insulin regular  0-10 Units Intravenous TID WC  . levalbuterol  0.63 mg Nebulization TID  . pantoprazole  40 mg Oral Daily  . sodium chloride  3 mL Intravenous Q12H   Continuous Infusions: . sodium chloride 20 mL/hr at 01/22/13 2255  . sodium chloride 10 mL/hr at 01/22/13 2254  . sodium chloride      . DOPamine Stopped (01/25/13 1230)  . insulin (NOVOLIN-R) infusion Stopped (01/24/13 1500)  . lactated ringers 20 mL/hr at 01/26/13 0400  . nitroGLYCERIN 5 mcg/min (01/22/13 2145)  . phenylephrine (NEO-SYNEPHRINE) Adult infusion Stopped (01/24/13 0500)   PRN Meds:.ALPRAZolam, levalbuterol, metoprolol, morphine injection, ondansetron (ZOFRAN) IV, oxyCODONE, sodium chloride  General appearance: alert, cooperative and no distress Neurologic: intact Heart: regular rate and rhythm Lungs: Diminished at bases L>R, no rhonchi Abdomen: soft, non-tender; bowel sounds normal; no masses,  no organomegaly Extremities: Bilateral LE edema R>L, RLE wounds clean and dry Wound: Sternal dressing intact  Lab Results: CBC:  Recent Labs  01/25/13 0340 01/26/13 0429  WBC 9.9 11.7*  HGB 9.4* 8.8*  HCT 27.9* 26.0*  PLT 80* 131*   BMET:   Recent Labs  01/24/13 0400 01/25/13 0340  NA 137 140  K 3.9 4.1  CL 107 104  CO2 24 26  GLUCOSE 120* 107*  BUN 11 13  CREATININE 0.68 0.73  CALCIUM 7.5* 8.0*    PT/INR: No results found for this basename: LABPROT, INR,  in the last 72 hours Radiology: Dg Chest Port 1 View  01/25/2013   CLINICAL DATA:  Patient is status post coronary artery bypass grafting  EXAM: PORTABLE CHEST - 1 VIEW  COMPARISON:  01/24/2013  FINDINGS: The patient's Swan-Ganz catheter has been removed. The Cordis is appreciated within the left subclavian vein. The endotracheal tube and NG tube removed in the interim. The patient's left-sided chest tube has been removed. There is no evidence of pneumothorax. Patient is status post venous sternotomy and coronary artery bypass grafting. There is prominence of the interstitial markings and peribronchial cuffing. An area of increased density projects in the left lung base. There is blunting of the left costophrenic angle. The cardiac silhouette is moderately enlarged. The osseous structures are unremarkable. Atherosclerotic calcifications are  identified within the aorta.  IMPRESSION: Interstitial infiltrate likely representing pulmonary edema. Atelectasis versus focal infiltrate left lung base worse posterior area were least a component of asymmetric edema. Small left effusion is also a diagnostic consideration. There is no evidence of an appreciable pneumothorax. Support lines and tubes have been removed as described above.   Electronically Signed   By: Salome Holmes M.D.   On: 01/25/2013 08:00     Assessment/Plan: S/P Procedure(s) (LRB): Coronary Artery Bypass Grafting Times Four Using Left Internal Mammary Artery and Right Saphenous Leg Vein Harvested Endoscopically (N/A)  1.CV-S/p STEMI.SR. BP labile.Weaned off Dopamine gttp yesterday. Will not start Lopressor yet 2.Pulmonary-History of COPD. Encourage incentive spirometer 3.Volume overload-gentle diurese as bp allows. Will not give this am 4.ABL anemia - H and H  8.8 and 26. Will start iron and folic acid 5.Mucinex for cough 6.Thrombocytopenia-platelets up to 131,000 7. DM-CBGs 97/292/82 . Pre op HGA1C 7. On  Levemir. Will stop Levemir and will start Metformin. 8. Remove sternal dressing 9. Possible transfer later today vs am  Elenore Rota 01/26/2013 7:35 AM  Chart reviewed, patient examined, agree with above. Her weight is below preop if accurate. She diuresed well yesterday. BP is ok this am in bed. Will see how she mobilizes this am and if no problems she can probably go to 2W.

## 2013-01-27 LAB — GLUCOSE, CAPILLARY
Glucose-Capillary: 79 mg/dL (ref 70–99)
Glucose-Capillary: 121 mg/dL — ABNORMAL HIGH (ref 70–99)
Glucose-Capillary: 119 mg/dL — ABNORMAL HIGH (ref 70–99)
Glucose-Capillary: 139 mg/dL — ABNORMAL HIGH (ref 70–99)
Glucose-Capillary: 145 mg/dL — ABNORMAL HIGH (ref 70–99)
Glucose-Capillary: 135 mg/dL — ABNORMAL HIGH (ref 70–99)
Glucose-Capillary: 184 mg/dL — ABNORMAL HIGH (ref 70–99)

## 2013-01-27 LAB — BASIC METABOLIC PANEL
BUN: 10 mg/dL (ref 6–23)
CO2: 32 mEq/L (ref 19–32)
Calcium: 8.3 mg/dL — ABNORMAL LOW (ref 8.4–10.5)
Chloride: 100 mEq/L (ref 96–112)
Creatinine, Ser: 0.73 mg/dL (ref 0.50–1.10)
GFR calc Af Amer: >90 mL/min (ref >90)
GFR calc non Af Amer: 90 mL/min — ABNORMAL LOW (ref >90)
Glucose, Bld: 129 mg/dL — ABNORMAL HIGH (ref 70–99)
Potassium: 4 mEq/L (ref 3.7–5.3)
Sodium: 141 mEq/L (ref 137–147)

## 2013-01-27 MED ORDER — BISACODYL 10 MG RE SUPP: 10.0000 mg | Freq: Every day | RECTAL | Status: DC | PRN

## 2013-01-27 MED ORDER — SODIUM CHLORIDE 0.9 % IJ SOLN: 3.0000 mL | INTRAMUSCULAR | Status: DC | PRN

## 2013-01-27 MED ORDER — ASPIRIN EC 325 MG PO TBEC
325.0000 mg | DELAYED_RELEASE_TABLET | Freq: Every day | ORAL | Status: DC
  Filled 2013-01-27: qty 1

## 2013-01-27 MED ORDER — TRAMADOL HCL 50 MG PO TABS
50.0000 mg | ORAL_TABLET | ORAL | Status: DC | PRN
  Administered 2013-01-27 – 2013-01-28 (×4): 50 mg via ORAL
  Filled 2013-01-27 (×4): qty 1

## 2013-01-27 MED ORDER — PANTOPRAZOLE SODIUM 40 MG PO TBEC
40.0000 mg | DELAYED_RELEASE_TABLET | Freq: Every day | ORAL | Status: DC
  Administered 2013-01-28 – 2013-01-29 (×2): 40 mg via ORAL
  Filled 2013-01-27 (×2): qty 1

## 2013-01-27 MED ORDER — BISACODYL 5 MG PO TBEC: 10.0000 mg | DELAYED_RELEASE_TABLET | Freq: Every day | ORAL | Status: DC | PRN

## 2013-01-27 MED ORDER — OXYCODONE HCL 5 MG PO TABS
5.0000 mg | ORAL_TABLET | ORAL | Status: DC | PRN
  Administered 2013-01-27 (×2): 10 mg via ORAL
  Filled 2013-01-27 (×2): qty 2

## 2013-01-27 MED ORDER — ONDANSETRON HCL 4 MG PO TABS: 4.0000 mg | ORAL_TABLET | Freq: Four times a day (QID) | ORAL | Status: DC | PRN

## 2013-01-27 MED ORDER — ENOXAPARIN SODIUM 40 MG/0.4ML ~~LOC~~ SOLN
40.0000 mg | SUBCUTANEOUS | Status: DC
  Administered 2013-01-27 – 2013-01-29 (×3): 40 mg via SUBCUTANEOUS
  Filled 2013-01-27 (×3): qty 0.4

## 2013-01-27 MED ORDER — FUROSEMIDE 40 MG PO TABS
40.0000 mg | ORAL_TABLET | Freq: Every day | ORAL | Status: DC
  Administered 2013-01-27 – 2013-01-29 (×3): 40 mg via ORAL
  Filled 2013-01-27 (×3): qty 1

## 2013-01-27 MED ORDER — METOPROLOL TARTRATE 12.5 MG HALF TABLET
12.5000 mg | ORAL_TABLET | Freq: Two times a day (BID) | ORAL | Status: DC
  Administered 2013-01-27 – 2013-01-29 (×5): 12.5 mg via ORAL
  Filled 2013-01-27 (×6): qty 1

## 2013-01-27 MED ORDER — SODIUM CHLORIDE 0.9 % IJ SOLN
3.0000 mL | Freq: Two times a day (BID) | INTRAMUSCULAR | Status: DC
  Administered 2013-01-28: 3 mL via INTRAVENOUS

## 2013-01-27 MED ORDER — MOVING RIGHT ALONG BOOK
Freq: Once | Status: AC
  Administered 2013-01-27: 1
  Filled 2013-01-27: qty 1

## 2013-01-27 MED ORDER — LEVALBUTEROL HCL 0.63 MG/3ML IN NEBU
0.6300 mg | INHALATION_SOLUTION | Freq: Four times a day (QID) | RESPIRATORY_TRACT | Status: DC | PRN
  Administered 2013-01-27 – 2013-01-28 (×2): 0.63 mg via RESPIRATORY_TRACT
  Filled 2013-01-27 (×3): qty 3

## 2013-01-27 MED ORDER — INSULIN ASPART 100 UNIT/ML ~~LOC~~ SOLN
0.0000 [IU] | Freq: Three times a day (TID) | SUBCUTANEOUS | Status: DC
  Administered 2013-01-27: 2 [IU] via SUBCUTANEOUS
  Administered 2013-01-27 – 2013-01-28 (×2): 4 [IU] via SUBCUTANEOUS
  Administered 2013-01-28 (×2): 2 [IU] via SUBCUTANEOUS
  Administered 2013-01-29: 4 [IU] via SUBCUTANEOUS

## 2013-01-27 MED ORDER — DOCUSATE SODIUM 100 MG PO CAPS
200.0000 mg | ORAL_CAPSULE | Freq: Every day | ORAL | Status: DC
  Administered 2013-01-28 – 2013-01-29 (×2): 200 mg via ORAL
  Filled 2013-01-27 (×2): qty 2

## 2013-01-27 MED ORDER — ONDANSETRON HCL 4 MG/2ML IJ SOLN
4.0000 mg | Freq: Four times a day (QID) | INTRAMUSCULAR | Status: DC | PRN
  Administered 2013-01-28: 4 mg via INTRAVENOUS
  Filled 2013-01-27: qty 2

## 2013-01-27 MED ORDER — POTASSIUM CHLORIDE CRYS ER 20 MEQ PO TBCR
20.0000 meq | EXTENDED_RELEASE_TABLET | Freq: Two times a day (BID) | ORAL | Status: DC
  Administered 2013-01-27 – 2013-01-29 (×4): 20 meq via ORAL
  Filled 2013-01-27 (×6): qty 1

## 2013-01-27 MED ORDER — SODIUM CHLORIDE 0.9 % IV SOLN: 250.0000 mL | INTRAVENOUS | Status: DC | PRN

## 2013-01-27 NOTE — Progress Notes (Signed)
5 Days Post-Op Procedure(s) (LRB): Coronary Artery Bypass Grafting Times Four Using Left Internal Mammary Artery and Right Saphenous Leg Vein Harvested Endoscopically (N/A) Subjective: No complaints  Objective: Vital signs in last 24 hours: Temp:  [97.7 F (36.5 C)-98.2 F (36.8 C)] 98.1 F (36.7 C) (01/01 0830) Pulse Rate:  [82-106] 89 (01/01 0900) Cardiac Rhythm:  [-] Normal sinus rhythm (01/01 0800) Resp:  [16-29] 17 (01/01 0900) BP: (102-131)/(49-96) 131/62 mmHg (01/01 0900) SpO2:  [94 %-100 %] 99 % (01/01 0900) Weight:  [72.167 kg (159 lb 1.6 oz)] 72.167 kg (159 lb 1.6 oz) (01/01 0500)  Hemodynamic parameters for last 24 hours:    Intake/Output from previous day: 12/31 0701 - 01/01 0700 In: 1140 [P.O.:700; I.V.:440] Out: 2425 [Urine:2425] Intake/Output this shift: Total I/O In: 40 [I.V.:40] Out: 300 [Urine:300]  General appearance: alert and cooperative Heart: regular rate and rhythm, S1, S2 normal, no murmur, click, rub or gallop Lungs: clear to auscultation bilaterally Extremities: edema mild Wound: incisions ok  Lab Results:  Recent Labs  01/25/13 0340 01/26/13 0429  WBC 9.9 11.7*  HGB 9.4* 8.8*  HCT 27.9* 26.0*  PLT 80* 131*   BMET:  Recent Labs  01/25/13 0340 01/27/13 0345  NA 140 141  K 4.1 4.0  CL 104 100  CO2 26 32  GLUCOSE 107* 129*  BUN 13 10  CREATININE 0.73 0.73  CALCIUM 8.0* 8.3*    PT/INR: No results found for this basename: LABPROT, INR,  in the last 72 hours ABG    Component Value Date/Time   PHART 7.309* 01/24/2013 1015   HCO3 22.5 01/24/2013 1015   TCO2 24 01/24/2013 1015   ACIDBASEDEF 4.0* 01/24/2013 1015   O2SAT 88.0 01/24/2013 1015   CBG (last 3)   Recent Labs  01/26/13 2325 01/27/13 0326 01/27/13 0802  GLUCAP 121* 119* 139*    Assessment/Plan: S/P Procedure(s) (LRB): Coronary Artery Bypass Grafting Times Four Using Left Internal Mammary Artery and Right Saphenous Leg Vein Harvested Endoscopically (N/A) She  is hemodynamically stable. BP looks much better today. She ambulated this am. Mobilize Diuresis Diabetes control Plan for transfer to step-down: see transfer orders   LOS: 5 days    Kaelon Weekes K 01/27/2013

## 2013-01-27 NOTE — Progress Notes (Signed)
Upon entering pt room, pt was anxious and confused. 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 Informed son of changes. Son at bedside. O2 sats 90%. Pt placed on 1L and increase in to 93%. Will continue to monitor pt closely.

## 2013-01-27 NOTE — Progress Notes (Addendum)
DAILY PROGRESS NOTE  Subjective:  Much improved today, less anasarcic. Platelets are improving.  Off of all bp support.  Objective:  Temp:  [97.7 F (36.5 C)-98.2 F (36.8 C)] 98.1 F (36.7 C) (01/01 0830) Pulse Rate:  [82-106] 94 (01/01 0800) Resp:  [16-29] 24 (01/01 0800) BP: (102-124)/(49-96) 124/58 mmHg (01/01 0800) SpO2:  [94 %-100 %] 98 % (01/01 0858) Weight:  [159 lb 1.6 oz (72.167 kg)] 159 lb 1.6 oz (72.167 kg) (01/01 0500) Weight change: 6 lb 8.7 oz (2.967 kg)  Intake/Output from previous day: 12/31 0701 - 01/01 0700 In: 1140 [P.O.:700; I.V.:440] Out: 2425 [Urine:2425]  Intake/Output from this shift: Total I/O In: 20 [I.V.:20] Out: 150 [Urine:150]  Medications: Current Facility-Administered Medications  Medication Dose Route Frequency Provider Last Rate Last Dose  . 0.45 % sodium chloride infusion   Intravenous Continuous John Giovanni, PA-C 20 mL/hr at 01/22/13 2255    . 0.9 %  sodium chloride infusion   Intravenous Continuous John Giovanni, PA-C 10 mL/hr at 01/22/13 2254    . 0.9 %  sodium chloride infusion  250 mL Intravenous Continuous Wayne E Gold, PA-C      . acetaminophen (TYLENOL) tablet 1,000 mg  1,000 mg Oral Q6H Wayne E Gold, PA-C   1,000 mg at 01/27/13 0500   Or  . acetaminophen (TYLENOL) solution 1,000 mg  1,000 mg Per Tube Q6H Wayne E Gold, PA-C   1,000 mg at 01/24/13 0521  . ALPRAZolam Duanne Moron) tablet 1 mg  1 mg Oral TID PRN Grace Isaac, MD   1 mg at 01/25/13 0344  . aspirin EC tablet 325 mg  325 mg Oral Daily John Giovanni, PA-C   325 mg at 01/26/13 3267   Or  . aspirin chewable tablet 324 mg  324 mg Per Tube Daily John Giovanni, PA-C   324 mg at 01/23/13 1245  . atorvastatin (LIPITOR) tablet 80 mg  80 mg Oral q1800 Pixie Casino, MD   80 mg at 01/26/13 1712  . bisacodyl (DULCOLAX) EC tablet 10 mg  10 mg Oral Daily John Giovanni, PA-C   10 mg at 01/25/13 0900   Or  . bisacodyl (DULCOLAX) suppository 10 mg  10 mg Rectal Daily John Giovanni,  PA-C   10 mg at 01/23/13 0955  . docusate sodium (COLACE) capsule 200 mg  200 mg Oral Daily Wayne E Gold, PA-C   200 mg at 01/26/13 1031  . ferrous sulfate tablet 325 mg  325 mg Oral Q breakfast Nani Skillern, PA-C   325 mg at 01/27/13 0815  . FLUoxetine (PROZAC) capsule 40 mg  40 mg Oral Daily Grace Isaac, MD   40 mg at 01/26/13 8099  . folic acid (FOLVITE) tablet 1 mg  1 mg Oral Daily Donielle Liston Alba, PA-C   1 mg at 01/26/13 8338  . guaiFENesin (MUCINEX) 12 hr tablet 600 mg  600 mg Oral BID Nani Skillern, PA-C   600 mg at 01/26/13 2250  . insulin aspart (novoLOG) injection 0-24 Units  0-24 Units Subcutaneous Q4H Grace Isaac, MD   2 Units at 01/27/13 0815  . insulin regular (NOVOLIN R,HUMULIN R) 1 Units/mL in sodium chloride 0.9 % 100 mL infusion   Intravenous Continuous Wayne E Gold, PA-C      . insulin regular bolus via infusion 0-10 Units  0-10 Units Intravenous TID WC Wayne E Gold, PA-C      . lactated  ringers infusion   Intravenous Continuous John Giovanni, PA-C 20 mL/hr at 01/26/13 0400    . levalbuterol (XOPENEX) nebulizer solution 0.63 mg  0.63 mg Nebulization TID Grace Isaac, MD   0.63 mg at 01/27/13 0859  . levalbuterol (XOPENEX) nebulizer solution 0.63 mg  0.63 mg Nebulization Q6H PRN Grace Isaac, MD      . metFORMIN (GLUCOPHAGE) tablet 500 mg  500 mg Oral BID WC Nani Skillern, PA-C   500 mg at 01/27/13 0815  . morphine 2 MG/ML injection 2-5 mg  2-5 mg Intravenous Q1H PRN John Giovanni, PA-C   2 mg at 01/24/13 1358  . ondansetron (ZOFRAN) injection 4 mg  4 mg Intravenous Q6H PRN John Giovanni, PA-C   4 mg at 01/24/13 1523  . oxyCODONE (Oxy IR/ROXICODONE) immediate release tablet 5-10 mg  5-10 mg Oral Q3H PRN John Giovanni, PA-C   10 mg at 01/27/13 0815  . pantoprazole (PROTONIX) EC tablet 40 mg  40 mg Oral Daily Wayne E Gold, PA-C   40 mg at 01/26/13 1031  . sodium chloride 0.9 % injection 3 mL  3 mL Intravenous Q12H Wayne E Gold, PA-C   3 mL  at 01/26/13 2022  . sodium chloride 0.9 % injection 3 mL  3 mL Intravenous PRN John Giovanni, PA-C        Physical Exam: General appearance: alert and no distress Neck: no carotid bruit and no JVD Lungs: clear to auscultation bilaterally Heart: regular rate and rhythm, S1, S2 normal, no murmur, click, rub or gallop Abdomen: soft, non-tender; bowel sounds normal; no masses,  no organomegaly Extremities: edema trace pedal Pulses: 2+ and symmetric Skin: Skin color, texture, turgor normal. No rashes or lesions Neurologic: Mental status: Alert, oriented, thought content appropriate .  Lab Results: Results for orders placed during the hospital encounter of 01/22/13 (from the past 48 hour(s))  GLUCOSE, CAPILLARY     Status: Abnormal   Collection Time    01/25/13 11:20 AM      Result Value Range   Glucose-Capillary 177 (*) 70 - 99 mg/dL   Comment 1 Documented in Chart     Comment 2 Notify RN    GLUCOSE, CAPILLARY     Status: None   Collection Time    01/25/13  3:43 PM      Result Value Range   Glucose-Capillary 97  70 - 99 mg/dL   Comment 1 Documented in Chart     Comment 2 Notify RN    GLUCOSE, CAPILLARY     Status: Abnormal   Collection Time    01/25/13  8:09 PM      Result Value Range   Glucose-Capillary 292 (*) 70 - 99 mg/dL  GLUCOSE, CAPILLARY     Status: None   Collection Time    01/25/13 11:40 PM      Result Value Range   Glucose-Capillary 82  70 - 99 mg/dL  GLUCOSE, CAPILLARY     Status: None   Collection Time    01/26/13  4:20 AM      Result Value Range   Glucose-Capillary 92  70 - 99 mg/dL  CBC     Status: Abnormal   Collection Time    01/26/13  4:29 AM      Result Value Range   WBC 11.7 (*) 4.0 - 10.5 K/uL   RBC 2.94 (*) 3.87 - 5.11 MIL/uL   Hemoglobin 8.8 (*) 12.0 - 15.0 g/dL  HCT 26.0 (*) 36.0 - 46.0 %   MCV 88.4  78.0 - 100.0 fL   MCH 29.9  26.0 - 34.0 pg   MCHC 33.8  30.0 - 36.0 g/dL   RDW 16.9  45.0 - 38.8 %   Platelets 131 (*) 150 - 400 K/uL    Comment: REPEATED TO VERIFY  GLUCOSE, CAPILLARY     Status: None   Collection Time    01/26/13  7:41 AM      Result Value Range   Glucose-Capillary 99  70 - 99 mg/dL   Comment 1 Documented in Chart     Comment 2 Notify RN    GLUCOSE, CAPILLARY     Status: Abnormal   Collection Time    01/26/13 11:42 AM      Result Value Range   Glucose-Capillary 169 (*) 70 - 99 mg/dL   Comment 1 Documented in Chart     Comment 2 Notify RN    GLUCOSE, CAPILLARY     Status: Abnormal   Collection Time    01/26/13  4:06 PM      Result Value Range   Glucose-Capillary 122 (*) 70 - 99 mg/dL   Comment 1 Documented in Chart     Comment 2 Notify RN    GLUCOSE, CAPILLARY     Status: None   Collection Time    01/26/13  8:25 PM      Result Value Range   Glucose-Capillary 79  70 - 99 mg/dL  GLUCOSE, CAPILLARY     Status: Abnormal   Collection Time    01/26/13 11:25 PM      Result Value Range   Glucose-Capillary 121 (*) 70 - 99 mg/dL  GLUCOSE, CAPILLARY     Status: Abnormal   Collection Time    01/27/13  3:26 AM      Result Value Range   Glucose-Capillary 119 (*) 70 - 99 mg/dL  BASIC METABOLIC PANEL     Status: Abnormal   Collection Time    01/27/13  3:45 AM      Result Value Range   Sodium 141  137 - 147 mEq/L   Comment: Please note change in reference range.   Potassium 4.0  3.7 - 5.3 mEq/L   Comment: Please note change in reference range.   Chloride 100  96 - 112 mEq/L   CO2 32  19 - 32 mEq/L   Glucose, Bld 129 (*) 70 - 99 mg/dL   BUN 10  6 - 23 mg/dL   Creatinine, Ser 8.28  0.50 - 1.10 mg/dL   Calcium 8.3 (*) 8.4 - 10.5 mg/dL   GFR calc non Af Amer 90 (*) >90 mL/min   GFR calc Af Amer >90  >90 mL/min   Comment: (NOTE)     The eGFR has been calculated using the CKD EPI equation.     This calculation has not been validated in all clinical situations.     eGFR's persistently <90 mL/min signify possible Chronic Kidney     Disease.  GLUCOSE, CAPILLARY     Status: Abnormal   Collection Time      01/27/13  8:02 AM      Result Value Range   Glucose-Capillary 139 (*) 70 - 99 mg/dL   Comment 1 Notify RN      Imaging: No results found.  Assessment:  1. Principal Problem: 2.   STEMI 01/22/13 3. Active Problems: 4.   Tobacco abuse 5.   S/P urgent CABG  x 4 01/22/13 6.   Shock circulatory- post op  7.   Cardiomyopathy, ischemic- EF 40% at cath 8.   Dyslipidemia 9.   Plan:  1. Continues to improve, now POD#5 s/p emergent CABG.  Platelets continue to rise - consider re-starting plavix 75 mg daily in addition to aspirin, given the recent STEMI.  On lipitor 80 mg.  BP may tolerate the addition of low-dose ACE-I prior to discharge.  Check echo to evaluate for improvement in LV function. Probably okay for transfer to telemetry floor today per TCTS.  Time Spent Directly with Patient:  15 minutes  Length of Stay:  LOS: 5 days   Pixie Casino, MD, [facility] Attending Cardiologist [facility]  HILTY,Kenneth C 01/27/2013, 9:21 AM

## 2013-01-27 NOTE — Progress Notes (Deleted)
   See Dr. Lysbeth Penner note

## 2013-01-28 DIAGNOSIS — E119 Type 2 diabetes mellitus without complications: Secondary | ICD-10-CM | POA: Diagnosis present

## 2013-01-28 LAB — GLUCOSE, CAPILLARY
GLUCOSE-CAPILLARY: 133 mg/dL — AB (ref 70–99)
GLUCOSE-CAPILLARY: 141 mg/dL — AB (ref 70–99)
GLUCOSE-CAPILLARY: 79 mg/dL (ref 70–99)
Glucose-Capillary: 178 mg/dL — ABNORMAL HIGH (ref 70–99)

## 2013-01-28 MED ORDER — LACTULOSE 10 GM/15ML PO SOLN
20.0000 g | Freq: Once | ORAL | Status: DC
  Filled 2013-01-28: qty 30

## 2013-01-28 MED ORDER — CLOPIDOGREL BISULFATE 75 MG PO TABS
75.0000 mg | ORAL_TABLET | Freq: Every day | ORAL | Status: DC
  Administered 2013-01-29: 75 mg via ORAL
  Filled 2013-01-28 (×3): qty 1

## 2013-01-28 MED ORDER — HYDROCODONE-ACETAMINOPHEN 7.5-325 MG PO TABS
1.0000 | ORAL_TABLET | Freq: Four times a day (QID) | ORAL | Status: DC | PRN
  Administered 2013-01-28 – 2013-01-29 (×4): 1 via ORAL
  Filled 2013-01-28 (×4): qty 1

## 2013-01-28 MED ORDER — ASPIRIN EC 81 MG PO TBEC
81.0000 mg | DELAYED_RELEASE_TABLET | Freq: Every day | ORAL | Status: DC
  Administered 2013-01-28 – 2013-01-29 (×2): 81 mg via ORAL
  Filled 2013-01-28 (×3): qty 1

## 2013-01-28 NOTE — Progress Notes (Addendum)
      [address]       [place] [address]             [phone]        6 Days Post-Op Procedure(s) (LRB): Coronary Artery Bypass Grafting Times Four Using Left Internal Mammary Artery and Right Saphenous Leg Vein Harvested Endoscopically (N/A)  Subjective: Patient was anxious and became confused yesterday evening. She also had nausea but no emesis yesterday.She is alert and oriented this am and denies nausea. She has not had a bowel movement in a few days.  Objective: Vital signs in last 24 hours: Temp:  [97.8 F (36.6 C)-98.7 F (37.1 C)] 97.8 F (36.6 C) (01/02 0343) Pulse Rate:  [78-99] 91 (01/02 0343) Cardiac Rhythm:  [-] Normal sinus rhythm (01/01 2030) Resp:  [17-27] 22 (01/02 0343) BP: (95-139)/(47-90) 113/47 mmHg (01/02 0343) SpO2:  [92 %-99 %] 92 % (01/02 0343) FiO2 (%):  [97 %] 97 % (01/01 2310) Weight:  [70.988 kg (156 lb 8 oz)] 70.988 kg (156 lb 8 oz) (01/02 0343)  Pre op weight 70 kg Current Weight  01/28/13 70.988 kg (156 lb 8 oz)      Intake/Output from previous day: 01/01 0701 - 01/02 0700 In: 420 [P.O.:360; I.V.:60] Out: 1350 [Urine:1350]   Physical Exam:  Cardiovascular: RRR, no murmurs, gallops, or rubs. Pulmonary: Diminished breath sounds at bases; no rales, wheezes, or rhonchi. Abdomen: Soft, non tender, bowel sounds present. Extremities: Trace bilateral lower extremity edema. Wounds: Clean and dry.  No erythema or signs of infection.  Lab Results: CBC: Recent Labs  01/26/13 0429  WBC 11.7*  HGB 8.8*  HCT 26.0*  PLT 131*   BMET:  Recent Labs  01/27/13 0345  NA 141  K 4.0  CL 100  CO2 32  GLUCOSE 129*  BUN 10  CREATININE 0.73  CALCIUM 8.3*    PT/INR:  Lab Results  Component Value Date   INR 1.51* 01/22/2013   ABG:  INR: Will add last result for INR, ABG once components are confirmed Will add last 4 CBG results once components are confirmed  Assessment/Plan:  1. CV - S/p STEMI.SR. On Lopressor  12.5 bid. As discussed with Dr. Debara Pickett, will start Plavix 75 daily and will decrease ecasa to 81 daily. 2.  Pulmonary - Encourage incentive spirometer 3. Volume Overload - On Lasix 40 daily 4.  Acute blood loss anemia - H and H 8.8 and 26. On folic acid and Ferrous sulfate. 5.Mild thrombocytopenia-platelets 131,000 6. DM-CBGs 135/184/133. HGA1C 6.6 pre op. Continue Metformin.She will require follow up as an outpatient. 7. LOC constipation 8. Regarding confusion, will stop Oxycodone. Ultram is not helping so will stop it. Patient requesting Lorcet as has taken pre op. 9.Remove EPW 10.Possible discharge Sunday or Monday  ZIMMERMAN,DONIELLE MPA-C 01/28/2013,8:33 AM  She appears better this pm w/r to mental status Agree with plan to DC home on home O2 this weekend- [birth year] CXR in am

## 2013-01-28 NOTE — Progress Notes (Signed)
EPW removed per protocol. Ends intact. Pt instructed to remain in bed for one hour. Verbalized understanding. VSS. Will continue to monitor pt closely.

## 2013-01-28 NOTE — Discharge Instructions (Signed)
Coronary Artery Bypass Grafting, Care After °Refer to this sheet in the next few weeks. These instructions provide you with information on caring for yourself after your procedure. Your health care provider may also give you more specific instructions. Your treatment has been planned according to current medical practices, but problems sometimes occur. Call your health care provider if you have any problems or questions after your procedure. °WHAT TO EXPECT AFTER THE PROCEDURE °Recovery from surgery will be different for everyone. Some people feel well after 3 or 4 weeks, while for others it takes longer. After your procedure, it is typical to have the following: °· Nausea and a lack of appetite.   °· Constipation. °· Weakness and fatigue.   °· Depression or irritability.   °· Pain or discomfort at your incision site. °HOME CARE INSTRUCTIONS °· Only take over-the-counter or prescription medicines as directed by your health care provider. Take all medicines exactly as directed. Do not stop taking medicines or start any new medicines without first checking with your health care provider.   °· Take your pulse as directed by your health care provider. °· Perform deep breathing as directed by your health care provider. If you were given a device called an incentive spirometer, use it to practice deep breathing several times a day. Support your chest with a pillow or your arms when you take deep breaths or cough. °· Keep incision areas clean, dry, and protected. Remove or change any bandages (dressings) only as directed by your health care provider. You may have skin adhesive strips over the incision areas. Do not take the strips off. They will fall off on their own. °· Check incision areas daily for any swelling, redness, or drainage. °· If incisions were made in your legs, do the following: °· Avoid crossing your legs.   °· Avoid sitting for long periods of time. Change positions every 30 minutes.   °· Elevate your legs  when you are sitting.   °· Wear compression stockings as directed by your health care provider. These stockings help keep blood clots from forming in your legs. °· Take showers once your health care provider approves. Until then, only take sponge baths. Pat incisions dry. Do not rub incisions with a washcloth or towel. Do not take tub baths or go swimming until your health care provider approves. °· Eat foods that are high in fiber, such as raw fruits and vegetables, whole grains, beans, and nuts. Meats should be lean cut. Avoid canned, processed, and fried foods. °· Drink enough fluids to keep your urine clear or pale yellow. °· Weigh yourself every day. This helps identify if you are retaining fluid that may make your heart and lungs work harder.   °· Rest and limit activity as directed by your health care provider. You may be instructed to: °· Stop any activity at once if you have chest pain, shortness of breath, irregular heartbeats, or dizziness. Get help right away if you have any of these symptoms. °· Move around frequently for short periods or take short walks as directed by your health care provider. Increase your activities gradually. You may need physical therapy or cardiac rehabilitation to help strengthen your muscles and build your endurance. °· Avoid lifting, pushing, or pulling anything heavier than 10 lb (4.5 kg) for at least 6 weeks after surgery. °· Do not drive until your health care provider approves.  °· Ask your health care provider when you may return to work and resume sexual activity. °· Follow up with your health care provider as   directed.   °SEEK MEDICAL CARE IF: °· You have swelling, redness, increasing pain, or drainage at the site of an incision.   °· You develop a fever.   °· You have swelling in your ankles or legs.   °· You have pain in your legs.   °· You have weight gain of 2 or more pounds a day. °· You are nauseous or vomit. °· You have diarrhea.  °SEEK IMMEDIATE MEDICAL CARE  IF: °· You have chest pain that goes to your jaw or arms. °· You have shortness of breath.   °· You have a fast or irregular heartbeat.   °· You notice a "clicking" in your breastbone (sternum) when you move.   °· You have numbness or weakness in your arms or legs. °· You feel dizzy or lightheaded.   °MAKE SURE YOU: °· Understand these instructions. °· Will watch your condition. °· Will get help right away if you are not doing well or get worse. °Document Released: 08/02/2004 Document Revised: 09/15/2012 Document Reviewed: 06/22/2012 °[employer]® Patient Information ©2014 [employer]. ° °Endoscopic Saphenous Vein Harvesting °Care After °Refer to this sheet in the next few weeks. These instructions provide you with information on caring for yourself after your procedure. Your caregiver may also give you more specific instructions. Your treatment has been planned according to current medical practices, but problems sometimes occur. Call your caregiver if you have any problems or questions after your procedure. °HOME CARE INSTRUCTIONS °Medicine °· Take whatever pain medicine your surgeon prescribes. Follow the directions carefully. Do not take over-the-counter pain medicine unless your surgeon says it is okay. Some pain medicine can cause bleeding problems for several weeks after surgery. °· Follow your surgeon's instructions about driving. You will probably not be permitted to drive after heart surgery. °· Take any medicines your surgeon prescribes. Any medicines you took before your heart surgery should be checked with your caregiver before you start taking them again. °Wound care °· Ask your surgeon how long you should keep wearing your elastic bandage or stocking. °· Check the area around your surgical cuts (incisions) whenever your bandages (dressings) are changed. Look for any redness or swelling. °· You will need to return to have the stitches (sutures) or staples taken out. Ask your surgeon when to do  that. °· Ask your surgeon when you can shower or bathe. °Activity °· Try to keep your legs raised when you are sitting. °· Do any exercises your caregivers have given you. These may include deep breathing exercises, coughing, walking, or other exercises. °SEEK MEDICAL CARE IF: °· You have any questions about your medicines. °· You have more leg pain, especially if your pain medicine stops working. °· New or growing bruises develop on your leg. °· Your leg swells, feels tight, or becomes red. °· You have numbness in your leg. °SEEK IMMEDIATE MEDICAL CARE IF: °· Your pain gets much worse. °· Blood or fluid leaks from any of the incisions. °· Your incisions become warm, swollen, or red. °· You have chest pain. °· You have trouble breathing. °· You have a fever. °· You have more pain near your leg incision. °MAKE SURE YOU: °· Understand these instructions. °· Will watch your condition. °· Will get help right away if you are not doing well or get worse. °Document Released: 09/25/2010 Document Revised: 04/07/2011 Document Reviewed: 09/25/2010 °Exit[employer]® Patient Information ©2014 [employer]. ° ° °

## 2013-01-28 NOTE — Progress Notes (Signed)
CARDIAC REHAB PHASE I   PRE:  Rate/Rhythm: 91 SR    BP: sitting 118/55    SaO2: 87 RA, 93 2L  MODE:  Ambulation: 550 ft   POST:  Rate/Rhythm: 99 SR    BP: sitting 128/60     SaO2: 97 2L  Pt SaO2 low in bed, 87 RA. Applied 2L for walking. Used RW, assist x2. Moving well, slow pace. Talkative. To bed to have EPW out. Left on 2L in bed. C/o smell in room from soap. Also c/o her breasts pulling on her. Family to bring bra. [place], [place], ACSM 01/28/2013 10:37 AM

## 2013-01-28 NOTE — Discharge Summary (Signed)
Physician Discharge Summary  Patient ID: [name] MRN: [id number] DOB/AGE: 02-Aug-1950 63 y.o.  Admit date: 01/22/2013 Discharge date: 01/29/2013  Admission Diagnoses:  Patient Active Problem List   Diagnosis Date Noted  . DM2 (diabetes mellitus, type 2) 01/28/2013  . Shock circulatory- post op  01/24/2013  . Cardiomyopathy, ischemic- EF 40% at cath 01/24/2013  . Dyslipidemia 01/24/2013  . STEMI 01/22/13 01/22/2013  . Tobacco abuse 01/22/2013   Discharge Diagnoses:   Patient Active Problem List   Diagnosis Date Noted  . DM2 (diabetes mellitus, type 2) 01/28/2013  . Shock circulatory- post op  01/24/2013  . Cardiomyopathy, ischemic- EF 40% at cath 01/24/2013  . Dyslipidemia 01/24/2013  . STEMI 01/22/13 01/22/2013  . Tobacco abuse 01/22/2013  . S/P urgent CABG x 4 01/22/13 01/22/2013   Discharged Condition: good  History of Present Illness:   [name] is a 63 yo female who presented to [facility] ED with complaints of substernal chest pain.  The pain originally developed around midnight the day of admission.  Upon arrival to ED she was give ASA and SL NTG which did relieve her chest pain.  EKG showed ST elevations in the inferior leads and ruled in for a STEMI.  Due to this she was emergently transferred to [facility] for further care.  Hospital Course:   Upon arrival patient was chest pain free.  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  It was felt she would need urgent coronary bypass grafting procedure and TCTS was consulted.  The patient was evaluated by Dr. Servando Snare who felt it would be best to proceed with emergent Coronary Bypass grafting procedure.  The risks and benefits of the procedure were explained to the patient and she was agreeable to proceed.  She was taken directly to the operating room from the catheterization lab.  She underwent CABG x 4 utilizing LIMA to  LAD, Sequential SVG to Ramus Intermediate and Distal Left Circumflex, and SVG to RCA.  She also underwent Endoscopic saphenous vein harvest of her right leg.  She tolerated the procedure without difficulty and was taken to the SICU in stable condition.  During her stay in the ICU the patient was weaned off Neo Synephrin, Dopamine, and Milrinone as tolerated.  She was extubated on POD #2 without difficulty.  Her chest tubes and arterial lines were removed without difficulty.  She was diuresed for hypervolemia.  She was not placed on therapeutic Lovenox due to Thrombocytopenia.  The patient had issues with hypotension and was not started on a Beta Blocker.  The patient continued to progress.  She ambulated around ICU without difficulty and was therefore transferred to the Step down unit in stable condition.    The patient continues to progress.  Her hypotension has resolved and she is tolerating a low dose Beta blocker.  She is maintaining NSR and her pacing wires were removed without difficulty.  Her thrombocytopenia has improved.  The patient is ambulating with minimal difficulty.  She is tolerating a diabetic diet and her sugars are controlled with use of Metformin.  She is ambulating on room air at 94%.She is medically stable at this time.  Should no further issues arise we anticipate discharge home in the next 24-48 hours.  She will follow up with Dr. Servando Snare on 02/24/2013 at 2:00 with a CXR prior to her appointment.  She will also need to  follow up with Cardiology in 2 times.  Significant Diagnostic Studies: angiography:   LV SYSTOLIC/LV DIASTOLIC: 382/50   ANGIOGRAPHIC RESULTS:  1. Left main; 80% distal  2. LAD; 99% proximal with visible thrombus at the first septal perforator  3. Left circumflex; nondominant with a minimal disease. There was a large ramus branch that arose from a height circumflex.  4. Right coronary artery; dominant, totally occluded in proximal portion with grade 2 left-to-right  collaterals  5. Left ventriculography; RAO left ventriculogram was performed using  25 mL of Visipaque dye at 12 mL/second. The overall LVEF estimated  40 % With wall motion abnormalities notable for moderate anteroapical hypokinesia  6: Supravalvular aortography-there was no dissection noted. Arch vessels were intact including the left subclavian artery  7: Distal abdominal aortography-the distal abdominal aorta and iliac bifurcation were free of significant disease and suitable for placement of intra-aortic balloon pump  Treatments: surgery:   Emergency coronary artery bypass grafting x4 with the left internal mammary to the left anterior descending coronary  artery, reverse saphenous vein graft sequentially to the intermediate coronary artery and distal circumflex, reverse saphenous vein graft to the distal right coronary artery, right thigh and calf Endovein harvesting, placement of left subclavian vein, Swan-Ganz catheter, transesophageal echocardiography .  Disposition: Home  Discharge Medications:    Medication List         ALPRAZolam 1 MG tablet  Commonly known as:  XANAX  Take 1 mg by mouth 3 (three) times daily as needed for anxiety.     aspirin 81 MG EC tablet  Take 1 tablet (81 mg total) by mouth daily.     atorvastatin 80 MG tablet  Commonly known as:  LIPITOR  Take 1 tablet (80 mg total) by mouth daily at 6 PM.     clopidogrel 75 MG tablet  Commonly known as:  PLAVIX  Take 1 tablet (75 mg total) by mouth daily with breakfast.     cyclobenzaprine 10 MG tablet  Commonly known as:  FLEXERIL  Take 10 mg by mouth 3 (three) times daily as needed for muscle spasms.     ferrous sulfate 325 (65 FE) MG tablet  Take 1 tablet (325 mg total) by mouth daily with breakfast. For one month then stop.     FLUoxetine 40 MG capsule  Commonly known as:  PROZAC  Take 40 mg by mouth daily.     folic acid 1 MG tablet  Commonly known as:  FOLVITE  Take 1 tablet (1 mg total) by  mouth daily. For one month then stop.     glucose blood test strip  Commonly known as:  ACCU-CHEK ACTIVE STRIPS  Use as instructed     guaiFENesin 600 MG 12 hr tablet  Commonly known as:  MUCINEX  Take 1 tablet (600 mg total) by mouth 2 (two) times daily as needed for cough.     hydrocodone-acetaminophen 7.5-650 MG per tablet  Commonly known as:  LORCET PLUS  Take 1 tablet by mouth every 6 (six) hours as needed for pain.     lisinopril 2.5 MG tablet  Commonly known as:  PRINIVIL,ZESTRIL  Take 1 tablet (2.5 mg total) by mouth daily.     metFORMIN 500 MG tablet  Commonly known as:  GLUCOPHAGE  Take 1 tablet (500 mg total) by mouth 2 (two) times daily with a meal.     metoprolol tartrate 25 MG tablet  Commonly known as:  LOPRESSOR  Take 0.5 tablets (12.5 mg  total) by mouth 2 (two) times daily.       The patient has been discharged on:   1.Beta Blocker:  Yes [x   ]                              No   [   ]                              If No, reason:  2.Ace Inhibitor/ARB: Yes [ x  ]                                     No  [   ]                                     If No, reason:  3.Statin:   Yes [ x  ]                  No  [   ]                  If No, reason:  4.Ecasa:  Yes  [ x  ]                  No   [   ]                  If No, reason:     Discharge Orders   Future Appointments Provider Department Dept Phone   02/08/2013 8:00 AM Brittainy Ladoris Gene [facility] [phone]   02/24/2013 2:00 PM Grace Isaac, MD [facility] [phone]   Future Orders Complete By Expires   Amb Referral to Cardiac Rehabilitation  As directed       Follow-up Information   Follow up with Grace Isaac, MD On 02/24/2013. (Appointment is at 2:00 pm)    Specialty:  Cardiothoracic Surgery   Contact information:   [address] [place] [address] [phone]       Follow up with Eitzen  IMAG[facility] On 02/24/2013. (Please get CXR at 1:00)    Contact information:   [place]       Follow up with SIMMONS, BRITTAINY, PA-C On 02/08/2013. (Appointment is at 10:00)    Specialty:  Cardiology   Contact information:   [place] [address] [phone]       Follow up with VYAS,DHRUV B., MD. (Call for a follow up 1-2 weeks regarding diabeters management)    Specialty:  Internal Medicine   Contact information:   [place] [address] [phone]       Signed: Nani Skillern 01/29/2013, 1:58 PM

## 2013-01-28 NOTE — Progress Notes (Signed)
DAILY PROGRESS NOTE  Subjective:  No complaints. Was somewhat confused overnight. Some chest soreness, she feels is worse from lack of breast support.  Objective:  Temp:  [97.8 F (36.6 C)-98.7 F (37.1 C)] 97.8 F (36.6 C) (01/02 0343) Pulse Rate:  [78-99] 91 (01/02 0343) Resp:  [19-27] 22 (01/02 0343) BP: (95-139)/(47-90) 113/47 mmHg (01/02 0343) SpO2:  [92 %-98 %] 92 % (01/02 0343) FiO2 (%):  [97 %] 97 % (01/01 2310) Weight:  [156 lb 8 oz (70.988 kg)] 156 lb 8 oz (70.988 kg) (01/02 0343) Weight change: -2 lb 9.6 oz (-1.179 kg)  Intake/Output from previous day: 01/01 0701 - 01/02 0700 In: 72 [P.O.:360; I.V.:60] Out: 1350 [Urine:1350]  Intake/Output from this shift:    Medications: Current Facility-Administered Medications  Medication Dose Route Frequency Provider Last Rate Last Dose  . 0.9 %  sodium chloride infusion  250 mL Intravenous PRN Gaye Pollack, MD      . ALPRAZolam Duanne Moron) tablet 1 mg  1 mg Oral TID PRN Grace Isaac, MD   1 mg at 01/28/13 0141  . aspirin EC tablet 81 mg  81 mg Oral Daily Donielle Liston Alba, PA-C      . atorvastatin (LIPITOR) tablet 80 mg  80 mg Oral q1800 Pixie Casino, MD   80 mg at 01/26/13 1712  . bisacodyl (DULCOLAX) EC tablet 10 mg  10 mg Oral Daily PRN Gaye Pollack, MD       Or  . bisacodyl (DULCOLAX) suppository 10 mg  10 mg Rectal Daily PRN Gaye Pollack, MD      . Derrill Memo ON 01/29/2013] clopidogrel (PLAVIX) tablet 75 mg  75 mg Oral Q breakfast Donielle M Zimmerman, PA-C      . docusate sodium (COLACE) capsule 200 mg  200 mg Oral Daily Gaye Pollack, MD      . enoxaparin (LOVENOX) injection 40 mg  40 mg Subcutaneous Q24H Gaye Pollack, MD   40 mg at 01/27/13 1203  . ferrous sulfate tablet 325 mg  325 mg Oral Q breakfast Nani Skillern, PA-C   325 mg at 01/28/13 0757  . FLUoxetine (PROZAC) capsule 40 mg  40 mg Oral Daily Grace Isaac, MD   40 mg at 01/27/13 2671  . folic acid (FOLVITE) tablet 1 mg  1 mg Oral  Daily Donielle Liston Alba, PA-C   1 mg at 01/27/13 2458  . furosemide (LASIX) tablet 40 mg  40 mg Oral Daily Gaye Pollack, MD   40 mg at 01/27/13 1206  . guaiFENesin (MUCINEX) 12 hr tablet 600 mg  600 mg Oral BID Nani Skillern, PA-C   600 mg at 01/27/13 2142  . HYDROcodone-acetaminophen (NORCO) 7.5-325 MG per tablet 1 tablet  1 tablet Oral Q6H PRN Donielle Liston Alba, PA-C      . insulin aspart (novoLOG) injection 0-24 Units  0-24 Units Subcutaneous TID AC & HS Gaye Pollack, MD   2 Units at 01/28/13 0998  . lactulose (CHRONULAC) 10 GM/15ML solution 20 g  20 g Oral Once Donielle M Zimmerman, PA-C      . levalbuterol [facility]) nebulizer solution 0.63 mg  0.63 mg Nebulization Q6H PRN Gaye Pollack, MD   0.63 mg at 01/27/13 2308  . metFORMIN (GLUCOPHAGE) tablet 500 mg  500 mg Oral BID WC Nani Skillern, PA-C   500 mg at 01/28/13 0757  . metoprolol tartrate (LOPRESSOR) tablet 12.5 mg  12.5 mg Oral  BID Gaye Pollack, MD   12.5 mg at 01/27/13 2141  . ondansetron (ZOFRAN) tablet 4 mg  4 mg Oral Q6H PRN Gaye Pollack, MD       Or  . ondansetron [facility]) injection 4 mg  4 mg Intravenous Q6H PRN Gaye Pollack, MD   4 mg at 01/28/13 0019  . pantoprazole (PROTONIX) EC tablet 40 mg  40 mg Oral QAC breakfast Gaye Pollack, MD   40 mg at 01/28/13 0757  . potassium chloride SA (K-DUR,KLOR-CON) CR tablet 20 mEq  20 mEq Oral BID Gaye Pollack, MD   20 mEq at 01/27/13 1201  . sodium chloride 0.9 % injection 3 mL  3 mL Intravenous Q12H Gaye Pollack, MD      . sodium chloride 0.9 % injection 3 mL  3 mL Intravenous PRN Gaye Pollack, MD        Physical Exam: General appearance: alert and no distress Neck: no carotid bruit and no JVD Lungs: clear to auscultation bilaterally Heart: regular rate and rhythm, S1, S2 normal, no murmur, click, rub or gallop Extremities: edema trace  Lab Results: Results for orders placed during the hospital encounter of 01/22/13 (from the past 48 hour(s))    GLUCOSE, CAPILLARY     Status: Abnormal   Collection Time    01/26/13 11:42 AM      Result Value Range   Glucose-Capillary 169 (*) 70 - 99 mg/dL   Comment 1 Documented in Chart     Comment 2 Notify RN    GLUCOSE, CAPILLARY     Status: Abnormal   Collection Time    01/26/13  4:06 PM      Result Value Range   Glucose-Capillary 122 (*) 70 - 99 mg/dL   Comment 1 Documented in Chart     Comment 2 Notify RN    GLUCOSE, CAPILLARY     Status: None   Collection Time    01/26/13  8:25 PM      Result Value Range   Glucose-Capillary 79  70 - 99 mg/dL  GLUCOSE, CAPILLARY     Status: Abnormal   Collection Time    01/26/13 11:25 PM      Result Value Range   Glucose-Capillary 121 (*) 70 - 99 mg/dL  GLUCOSE, CAPILLARY     Status: Abnormal   Collection Time    01/27/13  3:26 AM      Result Value Range   Glucose-Capillary 119 (*) 70 - 99 mg/dL  BASIC METABOLIC PANEL     Status: Abnormal   Collection Time    01/27/13  3:45 AM      Result Value Range   Sodium 141  137 - 147 mEq/L   Comment: Please note change in reference range.   Potassium 4.0  3.7 - 5.3 mEq/L   Comment: Please note change in reference range.   Chloride 100  96 - 112 mEq/L   CO2 32  19 - 32 mEq/L   Glucose, Bld 129 (*) 70 - 99 mg/dL   BUN 10  6 - 23 mg/dL   Creatinine, Ser 0.73  0.50 - 1.10 mg/dL   Calcium 8.3 (*) 8.4 - 10.5 mg/dL   GFR calc non Af Amer 90 (*) >90 mL/min   GFR calc Af Amer >90  >90 mL/min   Comment: (NOTE)     The eGFR has been calculated using the CKD EPI equation.     This calculation has not  been validated in all clinical situations.     eGFR's persistently <90 mL/min signify possible Chronic Kidney     Disease.  GLUCOSE, CAPILLARY     Status: Abnormal   Collection Time    01/27/13  8:02 AM      Result Value Range   Glucose-Capillary 139 (*) 70 - 99 mg/dL   Comment 1 Notify RN    GLUCOSE, CAPILLARY     Status: Abnormal   Collection Time    01/27/13 11:48 AM      Result Value Range    Glucose-Capillary 145 (*) 70 - 99 mg/dL   Comment 1 Notify RN    GLUCOSE, CAPILLARY     Status: Abnormal   Collection Time    01/27/13  4:43 PM      Result Value Range   Glucose-Capillary 135 (*) 70 - 99 mg/dL   Comment 1 Notify RN     Comment 2 Documented in Chart    GLUCOSE, CAPILLARY     Status: Abnormal   Collection Time    01/27/13  8:53 PM      Result Value Range   Glucose-Capillary 184 (*) 70 - 99 mg/dL  GLUCOSE, CAPILLARY     Status: Abnormal   Collection Time    01/28/13  6:04 AM      Result Value Range   Glucose-Capillary 133 (*) 70 - 99 mg/dL    Imaging: No results found.  Assessment:  1. Principal Problem: 2.   STEMI 01/22/13 3. Active Problems: 4.   Tobacco abuse 5.   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   Shock circulatory- post op  7.   Cardiomyopathy, ischemic- EF 40% at cath 8.   Dyslipidemia 9.   Plan:  1. She is about 3.8L out over the past 2 days. Plan re-check echo today, will help determine need for lasix and/or correct dose at home.  As discussed previously, would recommend decreasing ASA to 81 mg daily and adding Plavix 75 mg daily given her initial presentation as STEMI.  Time Spent Directly with Patient:  15 minutes  Length of Stay:  LOS: 6 days   Pixie Casino, MD, [facility] Attending Cardiologist [facility]  HILTY,Kenneth C 01/28/2013, 9:04 AM

## 2013-01-29 ENCOUNTER — Inpatient Hospital Stay (HOSPITAL_COMMUNITY): Payer: BC Managed Care – PPO

## 2013-01-29 DIAGNOSIS — E119 Type 2 diabetes mellitus without complications: Secondary | ICD-10-CM

## 2013-01-29 LAB — GLUCOSE, CAPILLARY
GLUCOSE-CAPILLARY: 133 mg/dL — AB (ref 70–99)
GLUCOSE-CAPILLARY: 164 mg/dL — AB (ref 70–99)
Glucose-Capillary: 165 mg/dL — ABNORMAL HIGH (ref 70–99)

## 2013-01-29 MED ORDER — ATORVASTATIN CALCIUM 80 MG PO TABS: 80.0000 mg | ORAL_TABLET | Freq: Every day | ORAL | Status: DC

## 2013-01-29 MED ORDER — GUAIFENESIN ER 600 MG PO TB12: 600.0000 mg | ORAL_TABLET | Freq: Two times a day (BID) | ORAL | Status: DC | PRN

## 2013-01-29 MED ORDER — LISINOPRIL 2.5 MG PO TABS: 2.5000 mg | ORAL_TABLET | Freq: Every day | ORAL | Status: DC

## 2013-01-29 MED ORDER — LEVALBUTEROL HCL 0.63 MG/3ML IN NEBU
0.6300 mg | INHALATION_SOLUTION | Freq: Three times a day (TID) | RESPIRATORY_TRACT | Status: DC
Start: 2013-01-29 — End: 2013-01-29

## 2013-01-29 MED ORDER — LEVALBUTEROL HCL 0.63 MG/3ML IN NEBU
0.6300 mg | INHALATION_SOLUTION | Freq: Three times a day (TID) | RESPIRATORY_TRACT | Status: DC
  Administered 2013-01-29: 0.63 mg via RESPIRATORY_TRACT

## 2013-01-29 MED ORDER — CLOPIDOGREL BISULFATE 75 MG PO TABS: 75.0000 mg | ORAL_TABLET | Freq: Every day | ORAL | Status: DC

## 2013-01-29 MED ORDER — FOLIC ACID 1 MG PO TABS: 1.0000 mg | ORAL_TABLET | Freq: Every day | ORAL | Status: DC

## 2013-01-29 MED ORDER — METOPROLOL TARTRATE 25 MG PO TABS: 12.5000 mg | ORAL_TABLET | Freq: Two times a day (BID) | ORAL | Status: DC

## 2013-01-29 MED ORDER — HYDROCODONE-ACETAMINOPHEN 7.5-325 MG PO TABS
1.0000 | ORAL_TABLET | Freq: Four times a day (QID) | ORAL | Status: DC | PRN
  Administered 2013-01-29: 1 via ORAL
  Filled 2013-01-29: qty 1

## 2013-01-29 MED ORDER — HYDROCODONE-ACETAMINOPHEN 7.5-650 MG PO TABS
1.0000 | ORAL_TABLET | Freq: Four times a day (QID) | ORAL | Status: DC | PRN
Start: 2013-01-29 — End: 2013-02-08

## 2013-01-29 MED ORDER — ASPIRIN 81 MG PO TBEC: 81.0000 mg | DELAYED_RELEASE_TABLET | Freq: Every day | ORAL | Status: DC

## 2013-01-29 MED ORDER — GLUCOSE BLOOD VI STRP: ORAL_STRIP | Status: DC

## 2013-01-29 MED ORDER — FERROUS SULFATE 325 (65 FE) MG PO TABS: 325.0000 mg | ORAL_TABLET | Freq: Every day | ORAL | Status: DC

## 2013-01-29 MED ORDER — TRAMADOL HCL 50 MG PO TABS
50.0000 mg | ORAL_TABLET | Freq: Four times a day (QID) | ORAL | Status: DC | PRN
  Filled 2013-01-29: qty 2

## 2013-01-29 MED ORDER — METFORMIN HCL 500 MG PO TABS: 500.0000 mg | ORAL_TABLET | Freq: Two times a day (BID) | ORAL | Status: DC

## 2013-01-29 MED ORDER — LISINOPRIL 2.5 MG PO TABS
2.5000 mg | ORAL_TABLET | Freq: Every day | ORAL | Status: DC
  Administered 2013-01-29: 2.5 mg via ORAL
  Filled 2013-01-29 (×2): qty 1

## 2013-01-29 NOTE — Progress Notes (Addendum)
DAILY PROGRESS NOTE  Subjective:  Continues to improve. No chest pain. Wants to go home today, her [birth year] is tomorrow.  Objective:  Temp:  [97.8 F (36.6 C)-98.4 F (36.9 C)] 98.4 F (36.9 C) (01/03 0830) Pulse Rate:  [75-84] 84 (01/03 0810) Resp:  [19-20] 19 (01/02 2029) BP: (103-127)/(47-63) 127/57 mmHg (01/03 0810) SpO2:  [90 %-97 %] 97 % (01/03 0839) Weight:  [154 lb 12.8 oz (70.217 kg)] 154 lb 12.8 oz (70.217 kg) (01/03 0854) Weight change:   Intake/Output from previous day: 01/02 0701 - 01/03 0700 In: 240 [P.O.:240] Out: -   Intake/Output from this shift:    Medications: Current Facility-Administered Medications  Medication Dose Route Frequency Provider Last Rate Last Dose  . 0.9 %  sodium chloride infusion  250 mL Intravenous PRN Gaye Pollack, MD      . ALPRAZolam Duanne Moron) tablet 1 mg  1 mg Oral TID PRN Grace Isaac, MD   1 mg at 01/29/13 0806  . aspirin EC tablet 81 mg  81 mg Oral Daily Nani Skillern, PA-C   81 mg at 01/29/13 1012  . atorvastatin (LIPITOR) tablet 80 mg  80 mg Oral q1800 Pixie Casino, MD   80 mg at 01/28/13 1738  . bisacodyl (DULCOLAX) EC tablet 10 mg  10 mg Oral Daily PRN Gaye Pollack, MD       Or  . bisacodyl (DULCOLAX) suppository 10 mg  10 mg Rectal Daily PRN Gaye Pollack, MD      . clopidogrel (PLAVIX) tablet 75 mg  75 mg Oral Q breakfast Nani Skillern, PA-C   75 mg at 01/29/13 0854  . docusate sodium (COLACE) capsule 200 mg  200 mg Oral Daily Gaye Pollack, MD   200 mg at 01/29/13 1012  . enoxaparin (LOVENOX) injection 40 mg  40 mg Subcutaneous Q24H Gaye Pollack, MD   40 mg at 01/28/13 1111  . ferrous sulfate tablet 325 mg  325 mg Oral Q breakfast Nani Skillern, PA-C   325 mg at 01/29/13 2542  . FLUoxetine (PROZAC) capsule 40 mg  40 mg Oral Daily Grace Isaac, MD   40 mg at 70/62/37 6283  . folic acid (FOLVITE) tablet 1 mg  1 mg Oral Daily Donielle Liston Alba, PA-C   1 mg at 01/29/13 1012  .  furosemide (LASIX) tablet 40 mg  40 mg Oral Daily Gaye Pollack, MD   40 mg at 01/29/13 1012  . guaiFENesin (MUCINEX) 12 hr tablet 600 mg  600 mg Oral BID Nani Skillern, PA-C   600 mg at 01/29/13 1012  . HYDROcodone-acetaminophen (NORCO) 7.5-325 MG per tablet 1 tablet  1 tablet Oral Q6H PRN Donielle Liston Alba, PA-C      . insulin aspart (novoLOG) injection 0-24 Units  0-24 Units Subcutaneous TID AC & HS Gaye Pollack, MD   4 Units at 01/28/13 1738  . lactulose (CHRONULAC) 10 GM/15ML solution 20 g  20 g Oral Once Donielle M Zimmerman, PA-C      . levalbuterol [facility]) nebulizer solution 0.63 mg  0.63 mg Nebulization Q8H Donielle Liston Alba, PA-C   0.63 mg at 01/29/13 0836  . metFORMIN (GLUCOPHAGE) tablet 500 mg  500 mg Oral BID WC Nani Skillern, PA-C   500 mg at 01/29/13 0806  . metoprolol tartrate (LOPRESSOR) tablet 12.5 mg  12.5 mg Oral BID Gaye Pollack, MD   12.5 mg at 01/29/13 1012  .  ondansetron (ZOFRAN) tablet 4 mg  4 mg Oral Q6H PRN Gaye Pollack, MD       Or  . ondansetron [facility]) injection 4 mg  4 mg Intravenous Q6H PRN Gaye Pollack, MD   4 mg at 01/28/13 0019  . pantoprazole (PROTONIX) EC tablet 40 mg  40 mg Oral QAC breakfast Gaye Pollack, MD   40 mg at 01/29/13 0806  . potassium chloride SA (K-DUR,KLOR-CON) CR tablet 20 mEq  20 mEq Oral BID Gaye Pollack, MD   20 mEq at 01/29/13 1012  . sodium chloride 0.9 % injection 3 mL  3 mL Intravenous Q12H Gaye Pollack, MD   3 mL at 01/28/13 1112  . sodium chloride 0.9 % injection 3 mL  3 mL Intravenous PRN Gaye Pollack, MD        Physical Exam: General appearance: alert and no distress Neck: no carotid bruit, no JVD and bruising over the right neck base Lungs: clear to auscultation bilaterally and well healing sternal wound Heart: regular rate and rhythm, S1, S2 normal, no murmur, click, rub or gallop Extremities: extremities normal, atraumatic, no cyanosis or edema  Lab Results: Results for orders placed  during the hospital encounter of 01/22/13 (from the past 48 hour(s))  GLUCOSE, CAPILLARY     Status: Abnormal   Collection Time    01/27/13 11:48 AM      Result Value Range   Glucose-Capillary 145 (*) 70 - 99 mg/dL   Comment 1 Notify RN    GLUCOSE, CAPILLARY     Status: Abnormal   Collection Time    01/27/13  4:43 PM      Result Value Range   Glucose-Capillary 135 (*) 70 - 99 mg/dL   Comment 1 Notify RN     Comment 2 Documented in Chart    GLUCOSE, CAPILLARY     Status: Abnormal   Collection Time    01/27/13  8:53 PM      Result Value Range   Glucose-Capillary 184 (*) 70 - 99 mg/dL  GLUCOSE, CAPILLARY     Status: Abnormal   Collection Time    01/28/13  6:04 AM      Result Value Range   Glucose-Capillary 133 (*) 70 - 99 mg/dL  GLUCOSE, CAPILLARY     Status: Abnormal   Collection Time    01/28/13 11:44 AM      Result Value Range   Glucose-Capillary 141 (*) 70 - 99 mg/dL   Comment 1 Notify RN     Comment 2 Documented in Chart    GLUCOSE, CAPILLARY     Status: Abnormal   Collection Time    01/28/13  3:58 PM      Result Value Range   Glucose-Capillary 178 (*) 70 - 99 mg/dL   Comment 1 Notify RN     Comment 2 Documented in Chart    GLUCOSE, CAPILLARY     Status: None   Collection Time    01/28/13  9:08 PM      Result Value Range   Glucose-Capillary 79  70 - 99 mg/dL  GLUCOSE, CAPILLARY     Status: Abnormal   Collection Time    01/29/13  8:05 AM      Result Value Range   Glucose-Capillary 133 (*) 70 - 99 mg/dL  GLUCOSE, CAPILLARY     Status: Abnormal   Collection Time    01/29/13  9:40 AM      Result Value Range  Glucose-Capillary 165 (*) 70 - 99 mg/dL  GLUCOSE, CAPILLARY     Status: Abnormal   Collection Time    01/29/13 11:28 AM      Result Value Range   Glucose-Capillary 164 (*) 70 - 99 mg/dL    Imaging: Dg Chest 2 View  01/29/2013   CLINICAL DATA:  Status post CABG  EXAM: CHEST  2 VIEW  COMPARISON:  01/25/2013  FINDINGS: There are bilateral small pleural  effusions. There is left lung base opacity likely representing atelectasis. There is mild bilateral interstitial thickening. Stable cardiomediastinal silhouette. Changes from CABG.  Osseous structures are unremarkable.  IMPRESSION: Mild pulmonary edema. Left basilar airspace opacity likely representing atelectasis. No significant interval change compared with the prior exam.   Electronically Signed   By: Elige Ko   On: 01/29/2013 10:59    Assessment:  1. Principal Problem: 2.   STEMI 01/22/13 3. Active Problems: 4.   Tobacco abuse 5.   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   Shock circulatory- post op  7.   Cardiomyopathy, ischemic- EF 40% at cath 8.   Dyslipidemia 9.   DM2 (diabetes mellitus, type 2) 10.   Plan:  1. Anticipate d/c home today with oxygen as necessary. On DAPT. Lasix 40 mg daily. Will add low dose ACE-I or ARB prior to d/c given recent STEMI per guidelines. Will arrange cardiology follow-up for her in our office.   Time Spent Directly with Patient:  15 minutes  Length of Stay:  LOS: 7 days   Chrystie Nose, MD, [facility] Attending Cardiologist [facility]  HILTY,Kenneth C 01/29/2013, 11:37 AM

## 2013-01-29 NOTE — Progress Notes (Signed)
Patient feels well and wants to go home, as tomorrow is her birthday. She is 94% on room air. Per Dr. Prescott Gum, ok to discharge. Discharge instructions discussed with patient and family.

## 2013-01-29 NOTE — Progress Notes (Signed)
CARDIAC REHAB PHASE I   PRE:  Rate/Rhythm: 87 SR  BP:  Supine:   Sitting: 104/60  Standing:    SaO2: 99% 2liters/min  MODE:  Ambulation: 300 ft   POST:  Rate/Rhythm: 108 ST  BP:  Supine:   Sitting: 110/70  Standing:    SaO2: 92% RA Tolerated ambulation well on RA, and a rolling walker with assistance x 1.   Able to demonstrate purse lip breathing.  See note below, really wants to go home today because her birthday is tomorrow.  Almost unreasonable regarding staying one more day in the hospital.  Education completed for possible discharge tomorrow.  Referred to phase II cardiac rehab at [facility].  Instructed to walk with staff 2 more times today.   [phone] Liliane Channel RN, BSN 01/29/2013 10:25 AM  SATURATION QUALIFICATIONS: (This note is used to comply with regulatory documentation for home oxygen)  Patient Saturations on Room Air at Rest = 92% Patient Saturations on Room Air while Ambulating =88-92%   Patient Saturations on 0 Liters of oxygen while Ambulating = 88-92%  Please briefly explain why patient needs home oxygen:  Seems as if for now patient does not require home oxygen.  She was able to demonstrate purse lip breathing and able to keep her saturations above 88%, and mainly 91-92% on room air while ambulating.

## 2013-01-29 NOTE — Progress Notes (Signed)
DC instructions reviewed and signed by patient; DC IV and tele per orders and hospital policy; DC CT sutures per MD orders; steri strips applied; patient has no further questions at this time; paper prescriptions given to patient; patient advised to purchase glucometer to check blood sugar before meals.  BARNETT, Marsh Dolly

## 2013-01-29 NOTE — Progress Notes (Signed)
Pt became confused and aggitated stating "youre hiding my son,i can hear him". Pt non combative, but speech is very aggressive. Spoke to sons, let pt speak to son. Pt continues to be aggitated after phone conversation. Refused vital signs and weight. Pt back in bed resting. monitioring will continue.

## 2013-01-29 NOTE — Progress Notes (Addendum)
      [address]       [place] [address]             [phone]        7 Days Post-Op Procedure(s) (LRB): Coronary Artery Bypass Grafting Times Four Using Left Internal Mammary Artery and Right Saphenous Leg Vein Harvested Endoscopically (N/A)  Subjective: Patient became confused earlier this am but she is alert and oriented now.  Objective: Vital signs in last 24 hours: Temp:  [97.8 F (36.6 C)-98.2 F (36.8 C)] 98.2 F (36.8 C) (01/02 2029) Pulse Rate:  [75-84] 84 (01/02 2029) Cardiac Rhythm:  [-] Normal sinus rhythm (01/02 2030) Resp:  [19-20] 19 (01/02 2029) BP: (103-126)/(47-63) 103/63 mmHg (01/02 2029) SpO2:  [91 %-96 %] 92 % (01/02 2144)  Pre op weight 70 kg Current Weight  01/28/13 70.988 kg (156 lb 8 oz)      Intake/Output from previous day: 01/02 0701 - 01/03 0700 In: 240 [P.O.:240] Out: -    Physical Exam:  Cardiovascular: RRR, no murmurs, gallops, or rubs. Pulmonary: Expiratory wheezing, diminished breath sounds at bases; no rales, or rhonchi. Abdomen: Soft, non tender, bowel sounds present. Extremities: Trace bilateral lower extremity edema. Wounds: Clean and dry.  No erythema or signs of infection.  Lab Results: CBC:No results found for this basename: WBC, HGB, HCT, PLT,  in the last 72 hours BMET:   Recent Labs  01/27/13 0345  NA 141  K 4.0  CL 100  CO2 32  GLUCOSE 129*  BUN 10  CREATININE 0.73  CALCIUM 8.3*    PT/INR:  Lab Results  Component Value Date   INR 1.51* 01/22/2013   ABG:  INR: Will add last result for INR, ABG once components are confirmed Will add last 4 CBG results once components are confirmed  Assessment/Plan:  1. CV - S/p STEMI.SR. On Lopressor 12.5 bid, ecasa 81 daily, and Plavix 75 daily. 2.  Pulmonary - On 1 liter of oxygen via [place]. Has history of COPD and is 88-89% on room air at rest. Will likely need home oxygen.Encourage incentive spirometer. Check CXR. Xopenex TID 3. Volume  Overload - On Lasix 40 daily. Will not need at discharge. 4.  Acute blood loss anemia - H and H 8.8 and 26. On folic acid and Ferrous sulfate. 5.Mild thrombocytopenia-platelets 131,000 6. DM-CBGs 141/178/79.HGA1C 6.6 pre op. On Metformin.She will require follow up as an outpatient. 7. Regarding confusion,  Oxycodone stopped. Restarted Lorcet as she took this at home. Will stop and tryUltramWill monitor 8.Remove sutures in am 9.Possible discharge in am likely with oxygen  ZIMMERMAN,DONIELLE MPA-C 01/29/2013,7:55 AM  Chest x-ray today reviewed, patient examined Agree she will be ready for discharge on home oxygen, possibly in a.m.   patient examined and medical record reviewed,agree with above note. VAN TRIGT III,PETER 01/29/2013

## 2013-01-29 NOTE — Progress Notes (Signed)
Patient ambulated 250 ft; while walking O2 saturation was checked:  Patient at rest 94% on room air Patient ambulating 94% on room air Patient ambulating 96% on 2L

## 2013-02-07 ENCOUNTER — Ambulatory Visit: Payer: BC Managed Care – PPO | Admitting: Cardiology

## 2013-02-08 ENCOUNTER — Ambulatory Visit (INDEPENDENT_AMBULATORY_CARE_PROVIDER_SITE_OTHER): Payer: 59 | Admitting: Cardiology

## 2013-02-08 ENCOUNTER — Ambulatory Visit: Payer: BC Managed Care – PPO | Admitting: Cardiology

## 2013-02-08 ENCOUNTER — Encounter: Payer: Self-pay | Admitting: Cardiology

## 2013-02-08 VITALS — BP 110/88 | HR 74 | Ht 64.0 in | Wt 146.0 lb

## 2013-02-08 DIAGNOSIS — I2589 Other forms of chronic ischemic heart disease: Secondary | ICD-10-CM

## 2013-02-08 DIAGNOSIS — E785 Hyperlipidemia, unspecified: Secondary | ICD-10-CM

## 2013-02-08 DIAGNOSIS — I219 Acute myocardial infarction, unspecified: Secondary | ICD-10-CM

## 2013-02-08 DIAGNOSIS — E119 Type 2 diabetes mellitus without complications: Secondary | ICD-10-CM

## 2013-02-08 DIAGNOSIS — Z720 Tobacco use: Secondary | ICD-10-CM

## 2013-02-08 DIAGNOSIS — I213 ST elevation (STEMI) myocardial infarction of unspecified site: Secondary | ICD-10-CM

## 2013-02-08 DIAGNOSIS — F17200 Nicotine dependence, unspecified, uncomplicated: Secondary | ICD-10-CM

## 2013-02-08 DIAGNOSIS — Z951 Presence of aortocoronary bypass graft: Secondary | ICD-10-CM

## 2013-02-08 DIAGNOSIS — I255 Ischemic cardiomyopathy: Secondary | ICD-10-CM

## 2013-02-08 NOTE — Progress Notes (Signed)
Patient ID: [name], female   DOB: Jul 25, 1950, 63 y.o.   MRN: [id number]  02/09/2013 [name]   09-08-1950  [id number]  Primary Physicia VYAS,DHRUV B., MD Primary Cardiologist: Dr. Gwenlyn Found  HPI:  The patient is a 63 y/o female, with history of long history of tobacco abuse, DM and HLD, who presented to [facility] on 01/22/13 with a STEMI. She was taken emergently to the cardiac catheterization lab, by Dr. Gwenlyn Found, and was found to have severe multivessel CAD with ruptured plaque in the LAD and reduced EF of 40%. It was felt she would need urgent coronary bypass grafting procedure and TCTS was consulted. The patient was evaluated by Dr. Servando Snare who felt it would be best to proceed with emergent Coronary Bypass grafting procedure. She was taken directly to the operating room from the catheterization lab. She underwent CABG x 4 utilizing a LIMA to LAD, Sequential SVG to Ramus Intermediate and Distal Left Circumflex, and SVG to RCA. She required a 7 day hospitalization. She was discharged home on 01/30/12.  She presents to clinic today for post-hospital follow-up. She states that she has been doing well since discharge. She denies any chest pain, SOB, diaphoresis, n/v. She reports daily compliance with all of her medications. She is attempting to quit smoking. She has recently transitioned to E-cigarets.     Current Outpatient Prescriptions  Medication Sig Dispense Refill  . ALPRAZolam (XANAX) 1 MG tablet Take 1 mg by mouth 3 (three) times daily as needed for anxiety.      [name] aspirin EC 81 MG EC tablet Take 1 tablet (81 mg total) by mouth daily.      [name] atorvastatin (LIPITOR) 80 MG tablet Take 1 tablet (80 mg total) by mouth daily at 6 PM.  30 tablet  1  . clopidogrel (PLAVIX) 75 MG tablet Take 1 tablet (75 mg total) by mouth daily with breakfast.  30 tablet  1  . cyclobenzaprine (FLEXERIL) 10 MG tablet Take 10 mg by mouth 3 (three) times daily as needed for muscle spasms.      . ferrous sulfate 325 (65 FE) MG  tablet Take 1 tablet (325 mg total) by mouth daily with breakfast. For one month then stop.  30 tablet  0  . FLUoxetine (PROZAC) 40 MG capsule Take 40 mg by mouth daily.      . folic acid (FOLVITE) 1 MG tablet Take 1 tablet (1 mg total) by mouth daily. For one month then stop.  30 tablet  0  . glucose blood (ACCU-CHEK ACTIVE STRIPS) test strip Use as instructed  100 each  12  . guaiFENesin (MUCINEX) 600 MG 12 hr tablet Take 1 tablet (600 mg total) by mouth 2 (two) times daily as needed for cough.      [name] HYDROcodone-acetaminophen (NORCO) 7.5-325 MG per tablet Take 1 tablet by mouth 4 (four) times daily as needed for moderate pain.      [name] lisinopril (PRINIVIL,ZESTRIL) 2.5 MG tablet Take 1 tablet (2.5 mg total) by mouth daily.  30 tablet  1  . metFORMIN (GLUCOPHAGE) 500 MG tablet Take 1 tablet (500 mg total) by mouth 2 (two) times daily with a meal.  60 tablet  1  . metoprolol tartrate (LOPRESSOR) 25 MG tablet Take 0.5 tablets (12.5 mg total) by mouth 2 (two) times daily.  30 tablet  1   No current facility-administered medications for this visit.    Allergies  Allergen Reactions  . Black Pepper [Piper] Hives, Itching and Rash  .  Other Hives, Itching, Rash and Other (See Comments)    METALS  . Tape Hives, Itching and Rash    History   Social History  . Marital Status: Unknown    Spouse Name: N/A    Number of Children: N/A  . Years of Education: N/A   Occupational History  . Not on file.   Social History Main Topics  . Smoking status: Current Every Day Smoker -- 50 years  . Smokeless tobacco: Not on file  . Alcohol Use: Not on file  . Drug Use: Not on file  . Sexual Activity: Not on file   Other Topics Concern  . Not on file   Social History Narrative  . No narrative on file     Review of Systems: General: negative for chills, fever, night sweats or weight changes.  Cardiovascular: negative for chest pain, dyspnea on exertion, edema, orthopnea, palpitations, paroxysmal  nocturnal dyspnea or shortness of breath Dermatological: negative for rash Respiratory: negative for cough or wheezing Urologic: negative for hematuria Abdominal: negative for nausea, vomiting, diarrhea, bright red blood per rectum, melena, or hematemesis Neurologic: negative for visual changes, syncope, or dizziness All other systems reviewed and are otherwise negative except as noted above.    Blood pressure 110/88, pulse 74, height 5\' 4"  (1.626 m), weight 146 lb (66.225 kg).  General appearance: alert, cooperative and no distress Neck: no carotid bruit and no JVD Lungs: clear to auscultation bilaterally Heart: regular rate and rhythm, S1, S2 normal, no murmur, click, rub or gallop Extremities: no LEE Pulses: 2+ and symmetric Skin: warm and dry Neurologic: Grossly normal  EKG: NSR, HR 74 bpm, no acute changes  ASSESSMENT AND PLAN:   S/P urgent CABG x 4 01/22/13 Stable. No anginal symptoms since discharge. On ASA, Plavix, Lopressor, Lisinopril and Lipitor.   Cardiomyopathy, ischemic- EF 40% at cath No issues with SOB, orthopnea, PND or LEE. Euvolemic on physical exam.   STEMI 01/22/13 Severe multivessel disease, treated with CABG x 4.   DM2 (diabetes mellitus, type 2) Managed by PCP. On Metformin. Last Hgb a1c stable at 6.6  Tobacco abuse Transitioning from cigarettes to E-cigs. Continue to work on complete discontinuation.   Dyslipidemia LDL of 200. Aggressive statin therapy with Lipitor. Discussed importance of proper diet and exercise to help improve cholesterol levels. Would like to achieve LDL goal of <70.     PLAN  2 weeks s/p emergent CABG x 4 in setting of STEMI. Doing well. No issues since discharge. Denies CP and SOB. EKG, and HR ok. BP well controlled. On the appropriate medicines. Has been compliant with medications. She is not interested in phase II cardiac rehab, even though this was encouraged. Pt encouraged to quick smoking. She will f/u with Dr. Gwenlyn Found in  2 months.   [name], BRITTAINYPA-C 02/09/2013 6:22 PM

## 2013-02-08 NOTE — Assessment & Plan Note (Signed)
Stable. No anginal symptoms since discharge. On ASA, Plavix, Lopressor, Lisinopril and Lipitor.

## 2013-02-08 NOTE — Patient Instructions (Signed)
Continue taking medicines as directed. Follow-up with Dr. Gwenlyn Found in 2 months. Try to work on quitting smoking.

## 2013-02-08 NOTE — Assessment & Plan Note (Signed)
No issues with SOB, orthopnea, PND or LEE. Euvolemic on physical exam.

## 2013-02-09 ENCOUNTER — Encounter: Payer: Self-pay | Admitting: Cardiology

## 2013-02-09 NOTE — Assessment & Plan Note (Signed)
Managed by PCP. On Metformin. Last Hgb a1c stable at 6.6

## 2013-02-09 NOTE — Assessment & Plan Note (Signed)
Transitioning from cigarettes to E-cigs. Continue to work on complete discontinuation.

## 2013-02-09 NOTE — Assessment & Plan Note (Signed)
LDL of 200. Aggressive statin therapy with Lipitor. Discussed importance of proper diet and exercise to help improve cholesterol levels. Would like to achieve LDL goal of <70.

## 2013-02-09 NOTE — Assessment & Plan Note (Signed)
Severe multivessel disease, treated with CABG x 4.

## 2013-02-22 ENCOUNTER — Other Ambulatory Visit: Payer: Self-pay | Admitting: *Deleted

## 2013-02-22 DIAGNOSIS — I2510 Atherosclerotic heart disease of native coronary artery without angina pectoris: Secondary | ICD-10-CM

## 2013-02-24 ENCOUNTER — Encounter: Payer: Self-pay | Admitting: Cardiothoracic Surgery

## 2013-02-24 ENCOUNTER — Ambulatory Visit (INDEPENDENT_AMBULATORY_CARE_PROVIDER_SITE_OTHER): Payer: 59 | Admitting: Cardiothoracic Surgery

## 2013-02-24 ENCOUNTER — Ambulatory Visit
Admission: RE | Admit: 2013-02-24 | Discharge: 2013-02-24 | Disposition: A | Discharge status: Home / Self Care | Payer: 59 | Source: Ambulatory Visit | Attending: Cardiothoracic Surgery | Admitting: Cardiothoracic Surgery

## 2013-02-24 VITALS — BP 97/66 | HR 83 | Resp 16 | Ht 64.0 in | Wt 137.2 lb

## 2013-02-24 DIAGNOSIS — I2510 Atherosclerotic heart disease of native coronary artery without angina pectoris: Secondary | ICD-10-CM

## 2013-02-24 DIAGNOSIS — Z951 Presence of aortocoronary bypass graft: Secondary | ICD-10-CM

## 2013-02-24 NOTE — Patient Instructions (Signed)
    [address]       [place] [address]             [phone]       Coronary Artery Bypass Grafting  Care After  Refer to this sheet in the next few weeks. These instructions provide you with information on caring for yourself after your procedure. Your caregiver may also give you more specific instructions. Your treatment has been planned according to current medical practices, but problems sometimes occur. Call your caregiver if you have any problems or questions after your procedure.  Recovery from open heart surgery will be different for everyone. Some people feel well after 3 or 4 weeks, while for others it takes longer. After heart surgery, it may be normal to:  Not have an appetite, feel nauseated by the smell of food, or only want to eat a small amount.   Be constipated because of changes in your diet, activity, and medicines. Eat foods high in fiber. Add fresh fruits and vegetables to your diet. Stool softeners may be helpful.   Feel sad or unhappy. You may be frustrated or cranky. You may have good days and bad days. Do not give up. Talk to your caregiver if you do not feel better.   Feel weakness and fatigue. You many need physical therapy or cardiac rehabilitation to get your strength back.   Develop an irregular heartbeat called atrial fibrillation. Symptoms of atrial fibrillation are a fast, irregular heartbeat or feelings of fluttery heartbeats, shortness of breath, low blood pressure, and dizziness. If these symptoms develop, see your caregiver right away.  MEDICATION  Have a list of all the medicines you will be taking when you leave the hospital. For every medicine, know the following:   Name.   Exact dose.   Time of day to be taken.   How often it should be taken.   Why you are taking it.   Ask which medicines should or should not be taken together. If you take more than one heart medicine, ask if it is okay to take them together. Some  heart medicines should not be taken at the same time because they may lower your blood pressure too much.   Narcotic pain medicine can cause constipation. Eat fresh fruits and vegetables. Add fiber to your diet. Stool softener medicine may help relieve constipation.   Keep a copy of your medicines with you at all times.   Do not add or stop taking any medicine until you check with your caregiver.   Medicines can have side effects. Call your caregiver who prescribed the medicine if you:   Start throwing up, have diarrhea, or have stomach pain.   Feel dizzy or lightheaded when you stand up.   Feel your heart is skipping beats or is beating too fast or too slow.   Develop a rash.   Notice unusual bruising or bleeding.  HOME CARE INSTRUCTIONS  After heart surgery, it is important to learn how to take your pulse. Have your caregiver show you how to take your pulse.   Use your incentive spirometer. Ask your caregiver how long after surgery you need to use it.  Care of your chest incision  Tell your caregiver right away if you notice clicking in your chest (sternum).   Support your chest with a pillow or your arms when you take deep breaths and cough.   Follow your caregiver's instructions about when you can bathe or   swim.   Protect your incision from sunlight during the first year to keep the scar from getting dark.   Tell your caregiver if you notice:   Increased tenderness of your incision.   Increased redness or swelling around your incision.   Drainage or pus from your incision.  Care of your leg incision(s)  Avoid crossing your legs.   Avoid sitting for long periods of time. Change positions every half hour.   Elevate your leg(s) when you are sitting.   Check your leg(s) daily for swelling. Check the incisions for redness or drainage.   Diet is very important to heart health.   Eat plenty of fresh fruits and vegetables. Meats should be lean cut. Avoid canned,  processed, and fried foods.   Talk to a dietician. They can teach you how to make healthy food and drink choices.  Weight  Weigh yourself every day. This is important because it helps to know if you are retaining fluid that may make your heart and lungs work harder.   Use the same scale each time.   Weigh yourself every morning at the same time. You should do this after you go to the bathroom, but before you eat breakfast.   Your weight will be more accurate if you do not wear any clothes.   Record your weight.   Tell your caregiver if you have gained 2 pounds or more overnight.  Activity Stop any activity at once if you have chest pain, shortness of breath, irregular heartbeats, or dizziness. Get help right away if you have any of these symptoms.  Bathing.  Avoid soaking in a bath or hot tub until your incisions are healed.   Rest. You need a balance of rest and activity.   Exercise. Exercise per your caregiver's advice. You may need physical therapy or cardiac rehabilitation to help strengthen your muscles and build your endurance.   Climbing stairs. Unless your caregiver tells you not to climb stairs, go up stairs slowly and rest if you tire. Do not pull yourself up by the handrail.   Driving a car. Follow your caregiver's advice on when you may drive. You may ride as a passenger at any time. When traveling for long periods of time in a car, get out of the car and walk around for a few minutes every 2 hours.   Lifting. Avoid lifting, pushing, or pulling anything heavier than 10 pounds for 6 weeks after surgery or as told by your caregiver.   Returning to work. Check with your caregiver. People heal at different rates. Most people will be able to go back to work 6 to 12 weeks after surgery.   Sexual activity. You may resume sexual relations as told by your caregiver.  SEEK MEDICAL CARE IF:  Any of your incisions are red, painful, or have any type of drainage coming from them.     You have an oral temperature above 101.5 F .   You have ankle or leg swelling.   You have pain in your legs.   You have weight gain of 2 or more pounds a day.   You feel dizzy or lightheaded when you stand up.  SEEK IMMEDIATE MEDICAL CARE IF:  You have angina or chest pain that goes to your jaw or arms. Call your local emergency services right away.   You have shortness of breath at rest or with activity.   You have a fast or irregular heartbeat (arrhythmia).   There is   a "clicking" in your sternum when you move.   You have numbness or weakness in your arms or legs.  MAKE SURE YOU:  Understand these instructions.   Will watch your condition.   Will get help right away if you are not doing well or get worse.    No lifting over 25 lbs for 3 months 

## 2013-02-24 NOTE — Progress Notes (Signed)
[address]       [place] [address]             [phone]                  [name] Shipshe[place]ana Medical Record #[id number] Date of Birth: [birth year]  Christine Harp, MD [name], MD  Chief Complaint:   PostOp Follow Up Visit 01/22/2013  OPERATIVE REPORT  PREOPERATIVE DIAGNOSIS: Critical left main and proximal LAD lesion with  acute STEMI involving the LAD.  POSTOPERATIVE DIAGNOSIS: Critical left main and proximal LAD lesion  with acute STEMI involving the LAD.  SURGICAL PROCEDURE: Emergency coronary artery bypass grafting x4 with  the left internal mammary to the left anterior descending coronary  artery, reverse saphenous vein graft sequentially to the intermediate  coronary artery and distal circumflex, reverse saphenous vein graft to  the distal right coronary artery, right thigh and calf Endovein  harvesting, placement of left subclavian vein, Swan-Ganz catheter,  transesophageal echocardiography .   History of Present Illness:      Patient is a 63 year old female who had emergency bypass surgery December 27 when she presented with acute STEMI. Considering her presentation she is making very good progress at this point. She is increasing her activity appropriately. She denies any shortness of breath or symptoms of congestive heart failure. She has stopped smoking, but continues to use vapor nicotine .   History  Smoking status  . Current Every Day Smoker -- 50 years  Smokeless tobacco  . Not on file       Allergies  Allergen Reactions  . Black Pepper [Piper] Hives, Itching and Rash  . Other Hives, Itching, Rash and Other (See Comments)    METALS  . Tape Hives, Itching and Rash    Current Outpatient Prescriptions  Medication Sig Dispense Refill  . ALPRAZolam (XANAX) 1 MG tablet Take 1 mg by mouth 3 (three) times daily as needed for anxiety.      [name] aspirin EC 81 MG EC tablet Take 1 tablet (81 mg total) by mouth daily.        [name] atorvastatin (LIPITOR) 80 MG tablet Take 1 tablet (80 mg total) by mouth daily at 6 PM.  30 tablet  1  . clopidogrel (PLAVIX) 75 MG tablet Take 1 tablet (75 mg total) by mouth daily with breakfast.  30 tablet  1  . cyclobenzaprine (FLEXERIL) 10 MG tablet Take 10 mg by mouth at bedtime.       [name] FLUoxetine (PROZAC) 40 MG capsule Take 40 mg by mouth daily.      [name] HYDROcodone-acetaminophen (NORCO) 7.5-325 MG per tablet Take 1 tablet by mouth 4 (four) times daily as needed for moderate pain.      [name] lisinopril (PRINIVIL,ZESTRIL) 2.5 MG tablet Take 1 tablet (2.5 mg total) by mouth daily.  30 tablet  1  . metFORMIN (GLUCOPHAGE) 500 MG tablet Take 1 tablet (500 mg total) by mouth 2 (two) times daily with a meal.  60 tablet  1  . metoprolol tartrate (LOPRESSOR) 25 MG tablet Take 0.5 tablets (12.5 mg total) by mouth 2 (two) times daily.  30 tablet  1   No current facility-administered medications for this visit.       Physical Exam: BP 97/66  Pulse 83  Resp 16  Ht 5\' 4"  (1.626 m)  Wt 137 lb 3.2 oz (62.234 kg)  BMI 23.54 kg/m2  SpO2 96%  General  appearance: alert, cooperative and appears older than stated age Neurologic: intact Heart: regular rate and rhythm, S1, S2 normal, no murmur, click, rub or gallop Lungs: clear to auscultation bilaterally and normal percussion bilaterally Abdomen: soft, non-tender; bowel sounds normal; no masses,  no organomegaly Extremities: extremities normal, atraumatic, no cyanosis or edema and Homans sign is negative, no sign of DVT Wound: Is stable and well-healed the right thigh endo- vein harvest site is also well-healed she has no pedal edema   Diagnostic Studies & Laboratory data:         Recent Radiology Findings: Dg Chest 2 View  02/24/2013   CLINICAL DATA:  Heart surgery in December 2014. Shortness of breath currently.  EXAM: CHEST  2 VIEW  COMPARISON:  None.  FINDINGS: There is mild left pleural thickening likely postsurgical. There is no focal  consolidation or pneumothorax. There is no right pleural effusion. The heart and mediastinal contours are unremarkable. Prior CABG.  The osseous structures are unremarkable.  IMPRESSION: Mild left pleural thickening likely postsurgical. Otherwise no acute cardiopulmonary disease.   Electronically Signed   By: Kathreen Devoid   On: 02/24/2013 13:43      Recent Labs: Lab Results  Component Value Date   WBC 11.7* 01/26/2013   HGB 8.8* 01/26/2013   HCT 26.0* 01/26/2013   PLT 131* 01/26/2013   GLUCOSE 129* 01/27/2013   CHOL 273* 01/22/2013   TRIG 196* 01/22/2013   HDL 32* 01/22/2013   LDLCALC 202* 01/22/2013   ALT 9 01/22/2013   AST 15 01/22/2013   NA 141 01/27/2013   K 4.0 01/27/2013   CL 100 01/27/2013   CREATININE 0.73 01/27/2013   BUN 10 01/27/2013   CO2 32 01/27/2013   INR 1.51* 01/22/2013   HGBA1C 6.6* 01/24/2013      Assessment / Plan:    Patient is now one month postop from emergency coronary artery bypass graft, in spite of her preoperative acute myocardial infarction she is making good progress without symptoms of congestive heart failure. She is avoiding tobacco. She is taking Lipitor but no followup lipid profile has been done by cardiology yet, she has followup appointment with Dr. Gwenlyn Found in mid March. I plan to see her back as necessary     Neylan Koroma B 02/24/2013 2:35 PM

## 2013-04-18 ENCOUNTER — Encounter: Payer: Self-pay | Admitting: Cardiology

## 2013-04-18 ENCOUNTER — Ambulatory Visit (INDEPENDENT_AMBULATORY_CARE_PROVIDER_SITE_OTHER): Payer: 59 | Admitting: Cardiology

## 2013-04-18 VITALS — BP 111/66 | HR 65 | Ht 64.0 in | Wt 143.0 lb

## 2013-04-18 DIAGNOSIS — I2510 Atherosclerotic heart disease of native coronary artery without angina pectoris: Secondary | ICD-10-CM

## 2013-04-18 DIAGNOSIS — I509 Heart failure, unspecified: Secondary | ICD-10-CM

## 2013-04-18 DIAGNOSIS — E785 Hyperlipidemia, unspecified: Secondary | ICD-10-CM

## 2013-04-18 MED ORDER — CARVEDILOL 3.125 MG PO TABS: 3.1250 mg | ORAL_TABLET | Freq: Two times a day (BID) | ORAL | Status: DC

## 2013-04-18 NOTE — Progress Notes (Signed)
Clinical Summary [name] is a 63 y.o.female former patient of Dr Gwenlyn Found who has transferred her care to the [facility] office. She is seen for the following medical problems.  1. CAD - prior STEMI 12/2012, found to have severe multivessel disease on cath. Had emergent CABG 4 vessel (LIMA-LAD, SVG to ramus, SVG to LCX, SVG to RCA. LVEF 40% by LV gram - 12/2012 TEE: LVEF 30-35%,   - denies any chest pain.Reports some breast pain/precordial pains. - reports some DOE at 1/2 block, cannot lay flat due to chronic back pain. Occas LE edema - compliant with meds, started DAPT in setting of STEMI and CABG.  - she has refused cardiac rehab due to severe lower back pain  2. Hyperlipidemia 12/2012 TC 273 TG 196 HDL 32 LDL 202 - compliant with statin  3. Tobacco - using e-cig, down to 10 cigs since December.  - she is allergic to adhesives and cannot use nicotine patches.   Past Medical History  Diagnosis Date  . Complication of anesthesia   . Depression   . Anxiety   . Myocardial infarction      Allergies  Allergen Reactions  . Black Pepper [Piper] Hives, Itching and Rash  . Other Hives, Itching, Rash and Other (See Comments)    METALS  . Tape Hives, Itching and Rash     Current Outpatient Prescriptions  Medication Sig Dispense Refill  . ALPRAZolam (XANAX) 1 MG tablet Take 1 mg by mouth 3 (three) times daily as needed for anxiety.      [name] aspirin EC 81 MG EC tablet Take 1 tablet (81 mg total) by mouth daily.      [name] atorvastatin (LIPITOR) 80 MG tablet Take 1 tablet (80 mg total) by mouth daily at 6 PM.  30 tablet  1  . clopidogrel (PLAVIX) 75 MG tablet Take 1 tablet (75 mg total) by mouth daily with breakfast.  30 tablet  1  . cyclobenzaprine (FLEXERIL) 10 MG tablet Take 10 mg by mouth at bedtime.       [name] FLUoxetine (PROZAC) 40 MG capsule Take 40 mg by mouth daily.      [name] HYDROcodone-acetaminophen (NORCO) 7.5-325 MG per tablet Take 1 tablet by mouth 4 (four) times daily as needed  for moderate pain.      [name] lisinopril (PRINIVIL,ZESTRIL) 2.5 MG tablet Take 1 tablet (2.5 mg total) by mouth daily.  30 tablet  1  . metFORMIN (GLUCOPHAGE) 500 MG tablet Take 1 tablet (500 mg total) by mouth 2 (two) times daily with a meal.  60 tablet  1  . metoprolol tartrate (LOPRESSOR) 25 MG tablet Take 0.5 tablets (12.5 mg total) by mouth 2 (two) times daily.  30 tablet  1   No current facility-administered medications for this visit.     Past Surgical History  Procedure Laterality Date  . Coronary artery bypass graft N/A 01/22/2013    Procedure: Coronary Artery Bypass Grafting Times Four Using Left Internal Mammary Artery and Right Saphenous Leg Vein Harvested Endoscopically;  Surgeon: Christine Isaac, MD;  Location: [place];  Service: Open Heart Surgery;  Laterality: N/A;  . Abdominal hysterectomy    . Back surgery    . Cholecystectomy       Allergies  Allergen Reactions  . Black Pepper [Piper] Hives, Itching and Rash  . Other Hives, Itching, Rash and Other (See Comments)    METALS  . Tape Hives, Itching and Rash  Family History  Problem Relation Age of Onset  . Heart disease       Social History [name] reports that she has been smoking.  She does not have any smokeless tobacco history on file. [name] has no alcohol history on file.   Review of Systems CONSTITUTIONAL: No weight loss, fever, chills, weakness or fatigue.  HEENT: Eyes: No visual loss, blurred vision, double vision or yellow sclerae.No hearing loss, sneezing, congestion, runny nose or sore throat.  SKIN: No rash or itching.  CARDIOVASCULAR: per HPI RESPIRATORY: No shortness of breath, cough or sputum.  GASTROINTESTINAL: No anorexia, nausea, vomiting or diarrhea. No abdominal pain or blood.  GENITOURINARY: No burning on urination, no polyuria NEUROLOGICAL: No headache, dizziness, syncope, paralysis, ataxia, numbness or tingling in the extremities. No change in bowel or bladder control.    MUSCULOSKELETAL: No muscle, back pain, joint pain or stiffness.  LYMPHATICS: No enlarged nodes. No history of splenectomy.  PSYCHIATRIC: No history of depression or anxiety.  ENDOCRINOLOGIC: No reports of sweating, cold or heat intolerance. No polyuria or polydipsia.  [name]   Physical Examination p 65 bp 111/66 Wt 143 lbs BMI 25 Gen: resting comfortably, no acute distress HEENT: no scleral icterus, pupils equal round and reactive, no palptable cervical adenopathy,  CV: RRR, no m/r/g, no JVD, no carotid bruits Resp: Clear to auscultation bilaterally GI: abdomen is soft, non-tender, non-distended, normal bowel sounds, no hepatosplenomegaly MSK: extremities are warm, no edema.  Skin: warm, no rash Neuro:  no focal deficits Psych: appropriate affect   Diagnostic Studies 12/2012 Cath HEMODYNAMICS:  AO SYSTOLIC/AO DIASTOLIC: 562/13  LV SYSTOLIC/LV DIASTOLIC: 086/57  ANGIOGRAPHIC RESULTS:  1. Left main; 80% distal  2. LAD; 99% proximal with visible thrombus at the first septal perforator  3. Left circumflex; nondominant with a minimal disease. There was a large ramus [name] that arose from a height circumflex.  4. Right coronary artery; dominant, totally occluded in proximal portion with grade 2 left-to-right collaterals  5. Left ventriculography; RAO left ventriculogram was performed using  25 mL of Visipaque dye at 12 mL/second. The overall LVEF estimated  40 % With wall motion abnormalities notable for moderate anteroapical hypokinesia  6: Supravalvular aortography-there was no dissection noted. Arch vessels were intact including the left subclavian artery  7: Distal abdominal aortography-the distal abdominal aorta and iliac bifurcation were free of significant disease and suitable for placement of intra-aortic balloon pump  IMPRESSION: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 She has a ruptured plaque and approximately the with visible thrombus. She'll  need urgent coronary bypass grafting. Dr.Ed Servando Snare was present at the end of the procedure.        Assessment and Plan  1. CAD/ICM - s/p CABG in 12/2012 - denies any current cardiac chest pain - will repeat echo to eval LVEF, prior to revasc moderate LV systolic dysfunction, if still exists will need aggressive titration of medications. - change lopressor to coreg in setting of systolic dysfunction  2. Hyperlipidemia - continue current statin - repeat lipids and liver tests  3. Tobacco - counseled on health benefits of cessation, she has cut down with use of e-cigs.    Follow up 1 month  Arnoldo Lenis, M.D., F.A.C.C.

## 2013-04-18 NOTE — Patient Instructions (Signed)
Your physician recommends that you schedule a follow-up appointment in: 1 month with Dr. Harl Bowie. This appointment will be scheduled today before you leave.   Your physician has recommended you make the following change in your medication:  Stop: Metoprolol (Lopressor) Start: Carvedilol (Coreg) 3.125 MG take 1 tablet by mouth twice daily  Continue all other medications the same.   Your physician recommends that you return for lab work in: tomorrow for fasting lipid and CMET: Nothing to eat or drink after midnight the night before test.  You may go to one of the following locations to have lab work completed: [facility] [address] Dr. Edrick Oh office in [facility].   Your physician has requested that you have an echocardiogram. Echocardiography is a painless test that uses sound waves to create images of your heart. It provides your doctor with information about the size and shape of your heart and how well your heart's chambers and valves are working. This procedure takes approximately one hour. There are no restrictions for this procedure.

## 2013-04-26 ENCOUNTER — Telehealth: Payer: Self-pay | Admitting: Cardiology

## 2013-04-26 MED ORDER — ROSUVASTATIN CALCIUM 20 MG PO TABS: 20.0000 mg | ORAL_TABLET | Freq: Every day | ORAL | Status: DC

## 2013-04-26 NOTE — Telephone Encounter (Signed)
Message copied by Lewayne Bunting on Tue Apr 26, 2013 11:34 AM ------      Message from: [place] F      Created: Tue Apr 26, 2013 11:06 AM       Please let patient know that cholesterol is better but still to high. We need to look into changing her to crestor 20mg  daily if she is able to afford it. Please contact her and let her know            Carlyle Dolly MD ------

## 2013-04-26 NOTE — Telephone Encounter (Signed)
Pt informed and verbalized understanding. Pt will call back to let us know if medication is to expensive. Prescription sent to [facility].

## 2013-04-27 ENCOUNTER — Other Ambulatory Visit (INDEPENDENT_AMBULATORY_CARE_PROVIDER_SITE_OTHER): Payer: 59

## 2013-04-27 ENCOUNTER — Other Ambulatory Visit: Payer: Self-pay

## 2013-04-27 DIAGNOSIS — I2510 Atherosclerotic heart disease of native coronary artery without angina pectoris: Secondary | ICD-10-CM

## 2013-04-27 DIAGNOSIS — I059 Rheumatic mitral valve disease, unspecified: Secondary | ICD-10-CM

## 2013-04-27 DIAGNOSIS — I509 Heart failure, unspecified: Secondary | ICD-10-CM

## 2013-05-03 ENCOUNTER — Telehealth: Payer: Self-pay | Admitting: Cardiology

## 2013-05-03 NOTE — Telephone Encounter (Signed)
Stop crestor and follow symptoms   Carlyle Dolly MD

## 2013-05-03 NOTE — Telephone Encounter (Signed)
[name] called stating that she thinks new medications are not agreeing with her. Please advise

## 2013-05-03 NOTE — Telephone Encounter (Signed)
Pt states that she her son told her that she called him and his wife in her sleep on Sunday. Pt states that she doesn't not remember that. Pt states that when she is standing in one spot she falling over. Pt states that she does not feel dizzy or lightheaded during this or before it happens. Pt states that her family will tell her she done something but she doesn't not remember doing it. Pt complains that her muscles ache. Pt complains that she has shortness of breath and it is more than usual. Pt states that she has some chest pain but not more than usual. Pt stated that on Sunday she had a sharp burning pain in her chest. Pt states that this was not happening until she started taking Crestor. Please advise.

## 2013-05-03 NOTE — Telephone Encounter (Signed)
Pt informed. Pt verbalized understanding. Pt also stated that she has not been taking her lisinopril for the past two weeks. I informed pt to start taking that medication. Pt verbalized understanding.

## 2013-05-10 ENCOUNTER — Telehealth: Payer: Self-pay | Admitting: Cardiology

## 2013-05-10 NOTE — Telephone Encounter (Signed)
Pt informed of results.Pt verbalized understanding

## 2013-05-10 NOTE — Telephone Encounter (Signed)
Message copied by Lewayne Bunting on Tue May 10, 2013  9:32 AM ------      Message from: [place] F      Created: Mon May 09, 2013 11:05 AM       Please let patient know that overall echo is normal                  Carlyle Dolly MD ------

## 2013-05-25 ENCOUNTER — Ambulatory Visit (INDEPENDENT_AMBULATORY_CARE_PROVIDER_SITE_OTHER): Payer: 59 | Admitting: Cardiology

## 2013-05-25 ENCOUNTER — Encounter: Payer: Self-pay | Admitting: Cardiology

## 2013-05-25 VITALS — BP 136/78 | HR 64 | Ht 64.0 in | Wt 140.0 lb

## 2013-05-25 DIAGNOSIS — I2510 Atherosclerotic heart disease of native coronary artery without angina pectoris: Secondary | ICD-10-CM

## 2013-05-25 DIAGNOSIS — E785 Hyperlipidemia, unspecified: Secondary | ICD-10-CM

## 2013-05-25 DIAGNOSIS — I10 Essential (primary) hypertension: Secondary | ICD-10-CM

## 2013-05-25 MED ORDER — ATORVASTATIN CALCIUM 80 MG PO TABS: 80.0000 mg | ORAL_TABLET | Freq: Every day | ORAL | Status: DC

## 2013-05-25 MED ORDER — LISINOPRIL 5 MG PO TABS: 5.0000 mg | ORAL_TABLET | Freq: Every day | ORAL | Status: DC

## 2013-05-25 NOTE — Progress Notes (Signed)
Clinical Summary [name] is a 63 y.o.female seen today for follow up of the following medical problems.   1. CAD  - prior STEMI 12/2012, found to have severe multivessel disease on cath. Had emergent CABG 4 vessel (LIMA-LAD, SVG to ramus, SVG to LCX, SVG to RCA. LVEF 40% by LV gram  - repeat echo 04/2013 showed normalized LVEF, 55-60%  - denies any chest pain. Continues to have bilateral breast/precoridal pain, left greater than right. Worst with movement.   - reports some DOE at 1 block, cannot lay flat due to chronic back pain. Occas LE edema  - compliant with meds, started DAPT in setting of STEMI and CABG.  - she has refused cardiac rehab due to severe lower back pain   2. Hyperlipidemia  03/2013 lipid panel TC 215 TG 218 HDL 39 LDL 132 - based on last lipid panel she was changed to crestor 20mg  daily from atorva 80 mg daily - reports side effects on crestor including abnormal behavior, calling family members at night and not remembering etc. Symptoms resolved after stopping the medication  3. . HTN - home blood pressures prior to taking meds 140s-170s/70-80s - reports medication compliance  Past Medical History  Diagnosis Date  . Complication of anesthesia   . Depression   . Anxiety   . Myocardial infarction      Allergies  Allergen Reactions  . Black Pepper [Piper] Hives, Itching and Rash  . Other Hives, Itching, Rash and Other (See Comments)    METALS  . Tape Hives, Itching and Rash     Current Outpatient Prescriptions  Medication Sig Dispense Refill  . ALPRAZolam (XANAX) 1 MG tablet Take 1 mg by mouth 3 (three) times daily as needed for anxiety.      [name] aspirin EC 81 MG EC tablet Take 1 tablet (81 mg total) by mouth daily.      [name] atorvastatin (LIPITOR) 80 MG tablet Take 1 tablet (80 mg total) by mouth daily at 6 PM.  30 tablet  1  . carvedilol (COREG) 3.125 MG tablet Take 1 tablet (3.125 mg total) by mouth 2 (two) times daily.  60 tablet  6  . clopidogrel  (PLAVIX) 75 MG tablet Take 1 tablet (75 mg total) by mouth daily with breakfast.  30 tablet  1  . FLUoxetine (PROZAC) 40 MG capsule Take 40 mg by mouth daily.      [name] HYDROcodone-acetaminophen (NORCO) 7.5-325 MG per tablet Take 1 tablet by mouth 4 (four) times daily as needed for moderate pain.      [name] lisinopril (PRINIVIL,ZESTRIL) 2.5 MG tablet Take 1 tablet (2.5 mg total) by mouth daily.  30 tablet  1  . rosuvastatin (CRESTOR) 20 MG tablet Take 1 tablet (20 mg total) by mouth daily.  30 tablet  6   No current facility-administered medications for this visit.     Past Surgical History  Procedure Laterality Date  . Coronary artery bypass graft N/A 01/22/2013    Procedure: Coronary Artery Bypass Grafting Times Four Using Left Internal Mammary Artery and Right Saphenous Leg Vein Harvested Endoscopically;  Surgeon: Grace Isaac, MD;  Location: [place];  Service: Open Heart Surgery;  Laterality: N/A;  . Abdominal hysterectomy    . Back surgery    . Cholecystectomy       Allergies  Allergen Reactions  . Black Pepper [Piper] Hives, Itching and Rash  . Other Hives, Itching, Rash and Other (See Comments)  METALS  . Tape Hives, Itching and Rash      Family History  Problem Relation Age of Onset  . Heart disease       Social History [name] reports that she has been smoking Cigarettes and E-cigarettes.  She started smoking about 48 years ago. She has a 50 pack-year smoking history. She has never used smokeless tobacco. [name] has no alcohol history on file.   Review of Systems CONSTITUTIONAL: No weight loss, fever, chills, weakness or fatigue.  HEENT: Eyes: No visual loss, blurred vision, double vision or yellow sclerae.No hearing loss, sneezing, congestion, runny nose or sore throat.  SKIN: No rash or itching.  CARDIOVASCULAR: per HPI RESPIRATORY: No shortness of breath, cough or sputum.  GASTROINTESTINAL: No anorexia, nausea, vomiting or diarrhea. No abdominal pain or  blood.  GENITOURINARY: No burning on urination, no polyuria NEUROLOGICAL: No headache, dizziness, syncope, paralysis, ataxia, numbness or tingling in the extremities. No change in bowel or bladder control.  MUSCULOSKELETAL: No muscle, back pain, joint pain or stiffness.  LYMPHATICS: No enlarged nodes. No history of splenectomy.  PSYCHIATRIC: No history of depression or anxiety.  ENDOCRINOLOGIC: No reports of sweating, cold or heat intolerance. No polyuria or polydipsia.  [name]   Physical Examination p 64 bp 136/78 Wt 140 lbs BMI 24 Gen: resting comfortably, no acute distress HEENT: no scleral icterus, pupils equal round and reactive, no palptable cervical adenopathy,  CV: RRR, no m/r/g, no JVD, no carotid bruits Resp: Clear to auscultation bilaterally GI: abdomen is soft, non-tender, non-distended, normal bowel sounds, no hepatosplenomegaly MSK: extremities are warm, no edema.  Skin: warm, no rash Neuro:  no focal deficits Psych: appropriate affect   Diagnostic Studies 04/2013 Echo LVEF 32-99%, grade I diastolic dysfunction, mild MR    Assessment and Plan   1. CAD/ICM  - s/p CABG in 12/2012  - denies any current cardiac chest pain  - LVEF decreased to 40% at time of bypass, repeat echo shows LVEF has normalized - continue current meds, continue DAPT at least one year in setting of STEMI and CABG.   2. Hyperlipidemia  - lipids were not at goal on last check, liptor was changed to crestor. Reports significant side effects on crestor, stopped taking - restart lipitor 80mg  daily  3. Tobacco  - counseled on health benefits of cessation,   4. HTN - not at goal, increase lisinopril to 5mg  daily.    F/u 6 months   Arnoldo Lenis, M.D., F.A.C.C.

## 2013-05-25 NOTE — Patient Instructions (Signed)
Your physician recommends that you schedule a follow-up appointment in: 6 months with Dr. Harl Bowie. You should receive a letter in the mail in 4 months. If you do not receive this letter by August 2015 call our office to schedule this appointment.   Your physician has recommended you make the following change in your medication:  Start: Atorvastatin (Lipitor) 80 MG take 1 tablet by mouth once daily Increase Lisinopril 5 MG take 1 tablet by mouth once daily  Continue all other medications the same.

## 2013-10-13 ENCOUNTER — Encounter (INDEPENDENT_AMBULATORY_CARE_PROVIDER_SITE_OTHER): Payer: Self-pay | Admitting: *Deleted

## 2013-11-08 ENCOUNTER — Telehealth (INDEPENDENT_AMBULATORY_CARE_PROVIDER_SITE_OTHER): Payer: Self-pay | Admitting: *Deleted

## 2013-11-08 ENCOUNTER — Other Ambulatory Visit (INDEPENDENT_AMBULATORY_CARE_PROVIDER_SITE_OTHER): Payer: Self-pay | Admitting: *Deleted

## 2013-11-08 ENCOUNTER — Ambulatory Visit (INDEPENDENT_AMBULATORY_CARE_PROVIDER_SITE_OTHER): Payer: 59 | Admitting: Internal Medicine

## 2013-11-08 ENCOUNTER — Encounter (INDEPENDENT_AMBULATORY_CARE_PROVIDER_SITE_OTHER): Payer: Self-pay | Admitting: Internal Medicine

## 2013-11-08 VITALS — BP 100/60 | HR 60 | Temp 98.0°F | Ht 64.0 in | Wt 132.8 lb

## 2013-11-08 DIAGNOSIS — Z800 Family history of malignant neoplasm of digestive organs: Secondary | ICD-10-CM

## 2013-11-08 DIAGNOSIS — Z79899 Other long term (current) drug therapy: Secondary | ICD-10-CM

## 2013-11-08 MED ORDER — PEG-KCL-NACL-NASULF-NA ASC-C 100 G PO SOLR: 1.0000 | Freq: Once | ORAL | Status: DC

## 2013-11-08 NOTE — Telephone Encounter (Signed)
Patient is scheduled for colonoscopy on 11/18/13 and needs to stop Plavix 5 days and ASA 2 days -- need to make sure this is ok with Dr Harl Bowie -- please advise, thanks

## 2013-11-08 NOTE — Patient Instructions (Signed)
Colonoscopy.  The risks and benefits such as perforation, bleeding, and infection were reviewed with the patient and is agreeable. 

## 2013-11-08 NOTE — Progress Notes (Signed)
Subjective:    Patient ID: [name], female    DOB: [birth year], 63 y.o.   MRN: [id number]  HPI Referred to our office for colonoscopy. Family hx of colon cancer in a brother age 64 and is now deceased two years ago. She also had a double cousin that colon cancer age 55. She has never undergone a colonoscopy in the past. Appetite is good. No weight loss unintentional.  No dysphagia. Patient has dentures. There is no abdominal pain. She usually has a BM daily.  She has seen blood in her stools 3-4 months ago. No melena. No change in her stools.      Review of Systems Past Medical History  Diagnosis Date  . Complication of anesthesia   . Depression   . Anxiety   . Myocardial infarction     Past Surgical History  Procedure Laterality Date  . Coronary artery bypass graft N/A 01/22/2013    Procedure: Coronary Artery Bypass Grafting Times Four Using Left Internal Mammary Artery and Right Saphenous Leg Vein Harvested Endoscopically;  Surgeon: Grace Isaac, MD;  Location: [place];  Service: Open Heart Surgery;  Laterality: N/A;  . Abdominal hysterectomy    . Back surgery    . Cholecystectomy    . Spinal fusion      x 2  . Tonsillectomy      age 72  . Appendectomy    . Exploratory laparotomy    . Tubal ligation    . Orif ankle fracture    . Breast lumpectomy      Allergies  Allergen Reactions  . Black Pepper [Piper] Hives, Itching and Rash  . Other Hives, Itching, Rash and Other (See Comments)    METALS  . Tape Hives, Itching and Rash    Current Outpatient Prescriptions on File Prior to Visit  Medication Sig Dispense Refill  . atorvastatin (LIPITOR) 80 MG tablet Take 1 tablet (80 mg total) by mouth daily.  30 tablet  6  . carvedilol (COREG) 3.125 MG tablet Take 1 tablet (3.125 mg total) by mouth 2 (two) times daily.  60 tablet  6  . clopidogrel (PLAVIX) 75 MG tablet Take 1 tablet (75 mg total) by mouth daily with breakfast.  30 tablet  1  . FLUoxetine (PROZAC) 40  MG capsule Take 40 mg by mouth daily.      [name] HYDROcodone-acetaminophen (NORCO) 7.5-325 MG per tablet Take 1 tablet by mouth 4 (four) times daily as needed for moderate pain.      [name] lisinopril (PRINIVIL,ZESTRIL) 5 MG tablet Take 1 tablet (5 mg total) by mouth daily.  30 tablet  6   No current facility-administered medications on file prior to visit.        Objective:   Physical Exam  Filed Vitals:   11/08/13 1513  BP: 100/60  Pulse: 60  Temp: 98 F (36.7 C)  Height: 5\' 4"  (1.626 m)  Weight: 132 lb 12.8 oz (60.238 kg)  Alert and oriented. Skin warm and dry. Oral mucosa is moist.   . Sclera anicteric, conjunctivae is pink. Thyroid not enlarged. No cervical lymphadenopathy. Lungs clear. Heart regular rate and rhythm.  Abdomen is soft. Bowel sounds are positive. No hepatomegaly. No abdominal masses felt. No tenderness.  No edema to lower extremities.  Stool brown and guaiac negative.         Assessment & Plan:  Family hx of colon cancer in a brother. Hx of some rectal bleeding. Colonic carcinoma needs to  be ruled out.  Colonoscopy.The risks and benefits such as perforation, bleeding, and infection were reviewed with the patient and is agreeable.

## 2013-11-08 NOTE — Telephone Encounter (Signed)
Patient needs movi prep 

## 2013-11-09 ENCOUNTER — Encounter (HOSPITAL_COMMUNITY): Payer: Self-pay | Admitting: Pharmacy Technician

## 2013-11-09 NOTE — Telephone Encounter (Signed)
Holding aspirin and plavix for the procedure is fine.  Zandra Abts MD

## 2013-11-18 ENCOUNTER — Encounter (HOSPITAL_COMMUNITY): Payer: Self-pay | Admitting: *Deleted

## 2013-11-18 ENCOUNTER — Encounter (HOSPITAL_COMMUNITY)
Admission: RE | Disposition: A | Discharge status: Home / Self Care | Payer: Self-pay | Source: Ambulatory Visit | Attending: Internal Medicine

## 2013-11-18 ENCOUNTER — Ambulatory Visit (HOSPITAL_COMMUNITY)
Admission: RE | Admit: 2013-11-18 | Discharge: 2013-11-18 | Disposition: A | Discharge status: Home / Self Care | Payer: 59 | Source: Ambulatory Visit | Attending: Internal Medicine | Admitting: Internal Medicine

## 2013-11-18 DIAGNOSIS — F418 Other specified anxiety disorders: Secondary | ICD-10-CM | POA: Diagnosis not present

## 2013-11-18 DIAGNOSIS — Z79899 Other long term (current) drug therapy: Secondary | ICD-10-CM | POA: Diagnosis not present

## 2013-11-18 DIAGNOSIS — Z800 Family history of malignant neoplasm of digestive organs: Secondary | ICD-10-CM

## 2013-11-18 DIAGNOSIS — I252 Old myocardial infarction: Secondary | ICD-10-CM | POA: Diagnosis not present

## 2013-11-18 DIAGNOSIS — D125 Benign neoplasm of sigmoid colon: Secondary | ICD-10-CM | POA: Insufficient documentation

## 2013-11-18 DIAGNOSIS — Z7902 Long term (current) use of antithrombotics/antiplatelets: Secondary | ICD-10-CM | POA: Insufficient documentation

## 2013-11-18 DIAGNOSIS — K644 Residual hemorrhoidal skin tags: Secondary | ICD-10-CM | POA: Insufficient documentation

## 2013-11-18 DIAGNOSIS — Z951 Presence of aortocoronary bypass graft: Secondary | ICD-10-CM | POA: Diagnosis not present

## 2013-11-18 DIAGNOSIS — F17210 Nicotine dependence, cigarettes, uncomplicated: Secondary | ICD-10-CM | POA: Diagnosis not present

## 2013-11-18 DIAGNOSIS — Z7982 Long term (current) use of aspirin: Secondary | ICD-10-CM | POA: Insufficient documentation

## 2013-11-18 DIAGNOSIS — Z1211 Encounter for screening for malignant neoplasm of colon: Secondary | ICD-10-CM | POA: Insufficient documentation

## 2013-11-18 DIAGNOSIS — K649 Unspecified hemorrhoids: Secondary | ICD-10-CM

## 2013-11-18 HISTORY — PX: COLONOSCOPY: SHX5424

## 2013-11-18 SURGERY — COLONOSCOPY
Anesthesia: Moderate Sedation

## 2013-11-18 MED ORDER — MEPERIDINE HCL 50 MG/ML IJ SOLN
INTRAMUSCULAR | Status: AC
  Filled 2013-11-18: qty 1

## 2013-11-18 MED ORDER — SODIUM CHLORIDE 0.9 % IJ SOLN
INTRAMUSCULAR | Status: AC
  Filled 2013-11-18: qty 10

## 2013-11-18 MED ORDER — MIDAZOLAM HCL 5 MG/5ML IJ SOLN
INTRAMUSCULAR | Status: AC
  Filled 2013-11-18: qty 10

## 2013-11-18 MED ORDER — STERILE WATER FOR IRRIGATION IR SOLN
Status: DC | PRN
  Administered 2013-11-18: 12:00:00

## 2013-11-18 MED ORDER — SODIUM CHLORIDE 0.9 % IV SOLN
INTRAVENOUS | Status: DC
  Administered 2013-11-18: 1000 mL via INTRAVENOUS

## 2013-11-18 MED ORDER — PROMETHAZINE HCL 25 MG/ML IJ SOLN
INTRAMUSCULAR | Status: AC
  Filled 2013-11-18: qty 1

## 2013-11-18 MED ORDER — MIDAZOLAM HCL 5 MG/5ML IJ SOLN
INTRAMUSCULAR | Status: DC | PRN
  Administered 2013-11-18: 2 mg via INTRAVENOUS
  Administered 2013-11-18: 3 mg via INTRAVENOUS
  Administered 2013-11-18 (×2): 2 mg via INTRAVENOUS
  Administered 2013-11-18 (×2): 3 mg via INTRAVENOUS
  Administered 2013-11-18: 2 mg via INTRAVENOUS

## 2013-11-18 MED ORDER — PROMETHAZINE HCL 25 MG/ML IJ SOLN
INTRAMUSCULAR | Status: DC | PRN
  Administered 2013-11-18: 12.5 mg via INTRAVENOUS

## 2013-11-18 MED ORDER — MEPERIDINE HCL 50 MG/ML IJ SOLN
INTRAMUSCULAR | Status: DC | PRN
  Administered 2013-11-18 (×2): 25 mg via INTRAVENOUS

## 2013-11-18 NOTE — Op Note (Signed)
COLONOSCOPY PROCEDURE REPORT  PATIENT:  [name]  MR#:  [id number] Birthdate:  [birth year], 63 y.o., female Endoscopist:  Dr. Rogene Houston, MD Referred By:  Dr. Glenda Chroman, MD Procedure Date: 11/18/2013  Procedure:   Colonoscopy  Indications:  Patient is 63 year old Caucasian female who is undergoing screening colonoscopy. This is patient's first exam. Her brother was diagnosed with CRC at age 40 and died of metastatic disease 2 years later. Ulcer is a cousin with surgery for CRC in his 66s. She has intermittent hematochezia felt to be secondary to hemorrhoids.  Informed Consent:  The procedure and risks were reviewed with the patient and informed consent was obtained.  Medications:  Demerol 50 mg IV Versed 17 mg IV Promethazine 12.5 mg IV and diluted form.  Description of procedure:  After a digital rectal exam was performed, that colonoscope was advanced from the anus through the rectum and colon to the area of the cecum, ileocecal valve and appendiceal orifice. The cecum was deeply intubated. These structures were well-seen and photographed for the record. From the level of the cecum and ileocecal valve, the scope was slowly and cautiously withdrawn. The mucosal surfaces were carefully surveyed utilizing scope tip to flexion to facilitate fold flattening as needed. The scope was pulled down into the rectum where a thorough exam including retroflexion was performed.  Findings:   Prep excellent. 3 mm polyp cold snared from sigmoid colon. Normal rectal mucosa. Small hemorrhoids below the dentate line.   Therapeutic/Diagnostic Maneuvers Performed:  See above  Complications:  None  Cecal Withdrawal Time:  7 minutes  Impression:  Examination performed to cecum. 3 mm polyp cold snared from sigmoid colon. External hemorrhoids.  Recommendations:  Standard instructions given. I will contact patient with biopsy results and further recommendations.  Kihanna Kamiya U   11/18/2013 12:14 PM  CC: Dr. [name], MD & Dr. Rayne Du ref. provider found

## 2013-11-18 NOTE — H&P (Signed)
[name] is an 63 y.o. female.   Chief Complaint: Patient is here for colonoscopy. HPI: Patient is 63 year old Caucasian female who is here for screening colonoscopy. Her brother was diagnosed with CRC prednisone 78 and died of metastatic disease but 2 years later. She also has a cousin who recently had surgery for CRC at 54. She denies diarrhea and/or constipation. She has intermittent hematochezia in the form of blood on the tissue she believes is coming from hemorrhoids. She denies abdominal pain. She had coronary artery bypass graft in December 2014 and is doing well. Plavix is on hold.  Past Medical History  Diagnosis Date  . Complication of anesthesia   . Depression   . Anxiety   . Myocardial infarction     Past Surgical History  Procedure Laterality Date  . Abdominal hysterectomy    . Back surgery    . Cholecystectomy    . Spinal fusion      x 2  . Tonsillectomy      age 29  . Appendectomy    . Exploratory laparotomy    . Tubal ligation    . Orif ankle fracture    . Breast lumpectomy    . Coronary artery bypass graft N/A 01/22/2013    Procedure: Coronary Artery Bypass Grafting Times Four Using Left Internal Mammary Artery and Right Saphenous Leg Vein Harvested Endoscopically;  Surgeon: Christine Isaac, MD;  Location: [facility];  Service: Open Heart Surgery;  Laterality: N/A;    Family History  Problem Relation Age of Onset  . Heart disease     Social History:  reports that she has been smoking Cigarettes and E-cigarettes.  She started smoking about 48 years ago. She has a 50 pack-year smoking history. She has never used smokeless tobacco. Her alcohol and drug histories are not on file.  Allergies:  Allergies  Allergen Reactions  . Black Pepper [Piper] Hives, Itching and Rash  . Other Hives, Itching, Rash and Other (See Comments)    METALS  . Tape Hives, Itching and Rash    Medications Prior to Admission  Medication Sig Dispense Refill  . aspirin EC 81 MG  tablet Take 81 mg by mouth daily.      [name] atorvastatin (LIPITOR) 80 MG tablet Take 1 tablet (80 mg total) by mouth daily.  30 tablet  6  . carvedilol (COREG) 3.125 MG tablet Take 1 tablet (3.125 mg total) by mouth 2 (two) times daily.  60 tablet  6  . clonazePAM (KLONOPIN) 1 MG tablet Take 1 mg by mouth 3 (three) times daily as needed for anxiety.      . clopidogrel (PLAVIX) 75 MG tablet Take 1 tablet (75 mg total) by mouth daily with breakfast.  30 tablet  1  . FLUoxetine (PROZAC) 40 MG capsule Take 40 mg by mouth daily.      [name] Tea, Camillia sinensis, (GREEN TEA PO) Take 1 tablet by mouth 3 (three) times daily.      [name] HYDROcodone-acetaminophen (NORCO) 7.5-325 MG per tablet Take 1 tablet by mouth 3 (three) times daily as needed for moderate pain.       [name] lactobacillus acidophilus (BACID) TABS tablet Take 2 tablets by mouth daily.      [name] lisinopril (PRINIVIL,ZESTRIL) 5 MG tablet Take 1 tablet (5 mg total) by mouth daily.  30 tablet  6  . Multiple Vitamin (MULTIVITAMIN) tablet Take 1 tablet by mouth daily.      . peg 3350 powder (  MOVIPREP) 100 G SOLR Take 1 kit (200 g total) by mouth once.  1 kit  0    No results found for this or any previous visit (from the past 48 hour(s)). No results found.  ROS  Blood pressure 139/69, pulse 65, temperature 98.3 F (36.8 C), temperature source Oral, resp. rate 16, SpO2 97.00%. Physical Exam  Constitutional: She appears well-developed and well-nourished.  HENT:  Mouth/Throat: Oropharynx is clear and moist.  Eyes: Conjunctivae are normal. No scleral icterus.  Neck: No thyromegaly present.  Cardiovascular: Normal rate, regular rhythm and normal heart sounds.   No murmur heard. Respiratory: Effort normal and breath sounds normal.  GI: Soft. She exhibits no distension and no mass. There is no tenderness.  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  Lymphadenopathy:    She has no cervical  adenopathy.  Neurological: She is alert.  Skin: Skin is warm.     Assessment/Plan High risk screening colonoscopy. Family history of colon carcinoma in first and second-degree relative.  [name] U 11/18/2013, 11:30 AM

## 2013-11-18 NOTE — Discharge Instructions (Signed)
Resume usual medications and diet. °No driving for 24 hours. °Physician will call with biopsy results. ° °Colonoscopy, Care After °Refer to this sheet in the next few weeks. These instructions provide you with information on caring for yourself after your procedure. Your health care provider may also give you more specific instructions. Your treatment has been planned according to current medical practices, but problems sometimes occur. Call your health care provider if you have any problems or questions after your procedure. °WHAT TO EXPECT AFTER THE PROCEDURE  °After your procedure, it is typical to have the following: °· A small amount of blood in your stool. °· Moderate amounts of gas and mild abdominal cramping or bloating. °HOME CARE INSTRUCTIONS °· Do not drive, operate machinery, or sign important documents for 24 hours. °· You may shower and resume your regular physical activities, but move at a slower pace for the first 24 hours. °· Take frequent rest periods for the first 24 hours. °· Walk around or put a warm pack on your abdomen to help reduce abdominal cramping and bloating. °· Drink enough fluids to keep your urine clear or pale yellow. °· You may resume your normal diet as instructed by your health care provider. Avoid heavy or fried foods that are hard to digest. °· Avoid drinking alcohol for 24 hours or as instructed by your health care provider. °· Only take over-the-counter or prescription medicines as directed by your health care provider. °· If a tissue sample (biopsy) was taken during your procedure: °¨ Do not take aspirin or blood thinners for 7 days, or as instructed by your health care provider. °¨ Do not drink alcohol for 7 days, or as instructed by your health care provider. °¨ Eat soft foods for the first 24 hours. °SEEK MEDICAL CARE IF: °You have persistent spotting of blood in your stool 2-3 days after the procedure. °SEEK IMMEDIATE MEDICAL CARE IF: °· You have more than a small  spotting of blood in your stool. °· You pass large blood clots in your stool. °· Your abdomen is swollen (distended). °· You have nausea or vomiting. °· You have a fever. °· You have increasing abdominal pain that is not relieved with medicine. °Document Released: 08/28/2003 Document Revised: 11/03/2012 Document Reviewed: 09/20/2012 °[employer]® Patient Information ©2015 [employer]. This information is not intended to replace advice given to you by your health care provider. Make sure you discuss any questions you have with your health care provider. ° °

## 2013-11-22 ENCOUNTER — Encounter (HOSPITAL_COMMUNITY): Payer: Self-pay | Admitting: Internal Medicine

## 2013-11-24 ENCOUNTER — Encounter: Payer: Self-pay | Admitting: Cardiology

## 2013-12-07 ENCOUNTER — Encounter (INDEPENDENT_AMBULATORY_CARE_PROVIDER_SITE_OTHER): Payer: Self-pay | Admitting: *Deleted

## 2013-12-13 ENCOUNTER — Encounter: Payer: Self-pay | Admitting: Cardiology

## 2013-12-13 ENCOUNTER — Ambulatory Visit (INDEPENDENT_AMBULATORY_CARE_PROVIDER_SITE_OTHER): Payer: 59 | Admitting: Cardiology

## 2013-12-13 VITALS — BP 102/64 | HR 59 | Ht 64.0 in | Wt 139.0 lb

## 2013-12-13 DIAGNOSIS — Z720 Tobacco use: Secondary | ICD-10-CM

## 2013-12-13 DIAGNOSIS — E785 Hyperlipidemia, unspecified: Secondary | ICD-10-CM

## 2013-12-13 DIAGNOSIS — I10 Essential (primary) hypertension: Secondary | ICD-10-CM

## 2013-12-13 DIAGNOSIS — I2510 Atherosclerotic heart disease of native coronary artery without angina pectoris: Secondary | ICD-10-CM

## 2013-12-13 MED ORDER — CLOPIDOGREL BISULFATE 75 MG PO TABS: 75.0000 mg | ORAL_TABLET | Freq: Every day | ORAL | Status: AC

## 2013-12-13 NOTE — Progress Notes (Signed)
Clinical Summary [name] is a 63 y.o.female seen today for follow up of the following medical problems.  1. CAD  - prior STEMI 12/2012, found to have severe multivessel disease on cath. Had emergent CABG 4 vessel (LIMA-LAD, SVG to ramus, SVG to LCX, SVG to RCA. LVEF 40% by LV gram  - repeat echo 04/2013 showed normalized LVEF, 55-60%  - denies any chest pain since last visit. Still with some precordial chest pain. Chronic SOB/DOE that is unchanged, no orthopnea, PND or LE edema.  - compliant with meds, started DAPT in setting of STEMI and CABG.  - she has refused cardiac rehab due to severe lower back pain   2. Hyperlipidemia  - compliant with statin. Prior side effects on crestor, tolerating atorvastatin well.   3. Tobacco - continues to smoke, reports she is not interested in quitting.   4. HTN - compliant with meds Past Medical History  Diagnosis Date  . Complication of anesthesia   . Depression   . Anxiety   . Myocardial infarction      Allergies  Allergen Reactions  . Black Pepper [Piper] Hives, Itching and Rash  . Other Hives, Itching, Rash and Other (See Comments)    METALS  . Tape Hives, Itching and Rash     Current Outpatient Prescriptions  Medication Sig Dispense Refill  . aspirin EC 81 MG tablet Take 81 mg by mouth daily.    [name] atorvastatin (LIPITOR) 80 MG tablet Take 1 tablet (80 mg total) by mouth daily. 30 tablet 6  . carvedilol (COREG) 3.125 MG tablet Take 1 tablet (3.125 mg total) by mouth 2 (two) times daily. 60 tablet 6  . clonazePAM (KLONOPIN) 1 MG tablet Take 1 mg by mouth 3 (three) times daily as needed for anxiety.    . clopidogrel (PLAVIX) 75 MG tablet Take 1 tablet (75 mg total) by mouth daily with breakfast. 30 tablet 1  . FLUoxetine (PROZAC) 40 MG capsule Take 40 mg by mouth daily.    Nyoka Cowden Tea, Camillia sinensis, (GREEN TEA PO) Take 1 tablet by mouth 3 (three) times daily.    [name] HYDROcodone-acetaminophen (NORCO) 7.5-325 MG per  tablet Take 1 tablet by mouth 3 (three) times daily as needed for moderate pain.     [name] lactobacillus acidophilus (BACID) TABS tablet Take 2 tablets by mouth daily.    [name] lisinopril (PRINIVIL,ZESTRIL) 5 MG tablet Take 1 tablet (5 mg total) by mouth daily. 30 tablet 6  . Multiple Vitamin (MULTIVITAMIN) tablet Take 1 tablet by mouth daily.     No current facility-administered medications for this visit.     Past Surgical History  Procedure Laterality Date  . Abdominal hysterectomy    . Back surgery    . Cholecystectomy    . Spinal fusion      x 2  . Tonsillectomy      age 67  . Appendectomy    . Exploratory laparotomy    . Tubal ligation    . Orif ankle fracture    . Breast lumpectomy    . Coronary artery bypass graft N/A 01/22/2013    Procedure: Coronary Artery Bypass Grafting Times Four Using Left Internal Mammary Artery and Right Saphenous Leg Vein Harvested Endoscopically;  Surgeon: Grace Isaac, MD;  Location: [facility];  Service: Open Heart Surgery;  Laterality: N/A;  . Colonoscopy N/A 11/18/2013    Procedure: COLONOSCOPY;  Surgeon: Rogene Houston, MD;  Location: [facility];  Service:  Endoscopy;  Laterality: N/A;  1200     Allergies  Allergen Reactions  . Black Pepper [Piper] Hives, Itching and Rash  . Other Hives, Itching, Rash and Other (See Comments)    METALS  . Tape Hives, Itching and Rash      Family History  Problem Relation Age of Onset  . Heart disease       Social History [name] reports that she has been smoking Cigarettes and E-cigarettes.  She started smoking about 48 years ago. She has a 50 pack-year smoking history. She has never used smokeless tobacco. [name] has no alcohol history on file.   Review of Systems CONSTITUTIONAL: No weight loss, fever, chills, weakness or fatigue.  HEENT: Eyes: No visual loss, blurred vision, double vision or yellow sclerae.No hearing loss, sneezing, congestion, runny nose or sore throat.  SKIN: No rash or  itching.  CARDIOVASCULAR: per HPI RESPIRATORY: No shortness of breath, cough or sputum.  GASTROINTESTINAL: No anorexia, nausea, vomiting or diarrhea. No abdominal pain or blood.  GENITOURINARY: No burning on urination, no polyuria NEUROLOGICAL: No headache, dizziness, syncope, paralysis, ataxia, numbness or tingling in the extremities. No change in bowel or bladder control.  MUSCULOSKELETAL: No muscle, back pain, joint pain or stiffness.  LYMPHATICS: No enlarged nodes. No history of splenectomy.  PSYCHIATRIC: No history of depression or anxiety.  ENDOCRINOLOGIC: No reports of sweating, cold or heat intolerance. No polyuria or polydipsia.  [name]   Physical Examination p 59 bp 102/64 Wt 139 lbs BMI 24 Gen: resting comfortably, no acute distress HEENT: no scleral icterus, pupils equal round and reactive, no palptable cervical adenopathy,  CV: RRR, no m/r/g, no JVD, no carotid brutis Resp: Clear to auscultation bilaterally GI: abdomen is soft, non-tender, non-distended, normal bowel sounds, no hepatosplenomegaly MSK: extremities are warm, no edema.  Skin: warm, no rash Neuro:  no focal deficits Psych: appropriate affect   Diagnostic Studies  04/2013 Echo LVEF 70-17%, grade I diastolic dysfunction, mild MR   Assessment and Plan   1. CAD - s/p CABG in 12/2012  - LVEF decreased to 40% at time of bypass, repeat echo shows LVEF has normalized - continue current meds, she will stop plavix at the end of December  2. Hyperlipidemia  - continue high dose statin in setting of known CAD  3. Tobacco  - counseled on health benefits of cessation, she is not interested in quitting at this time.   4. HTN - at goal, continue current meds   F/u 6 months   Arnoldo Lenis, M.D.

## 2013-12-13 NOTE — Patient Instructions (Signed)
   Stop Plavix on January 22, 2014. Continue all other medications.   Your physician wants you to follow up in: 6 months.  You will receive a reminder letter in the mail one-two months in advance.  If you don't receive a letter, please call our office to schedule the follow up appointment

## 2014-01-05 ENCOUNTER — Encounter (HOSPITAL_COMMUNITY): Payer: Self-pay | Admitting: Cardiovascular Disease

## 2014-01-13 ENCOUNTER — Encounter (INDEPENDENT_AMBULATORY_CARE_PROVIDER_SITE_OTHER): Payer: Self-pay

## 2014-04-17 ENCOUNTER — Ambulatory Visit (INDEPENDENT_AMBULATORY_CARE_PROVIDER_SITE_OTHER): Payer: 59 | Admitting: Cardiology

## 2014-04-17 ENCOUNTER — Encounter: Payer: Self-pay | Admitting: Cardiology

## 2014-04-17 VITALS — BP 114/66 | HR 61 | Ht 64.0 in | Wt 135.0 lb

## 2014-04-17 DIAGNOSIS — R079 Chest pain, unspecified: Secondary | ICD-10-CM

## 2014-04-17 DIAGNOSIS — I2510 Atherosclerotic heart disease of native coronary artery without angina pectoris: Secondary | ICD-10-CM | POA: Diagnosis not present

## 2014-04-17 DIAGNOSIS — E785 Hyperlipidemia, unspecified: Secondary | ICD-10-CM

## 2014-04-17 DIAGNOSIS — I255 Ischemic cardiomyopathy: Secondary | ICD-10-CM

## 2014-04-17 NOTE — Progress Notes (Signed)
Clinical Summary [name] is a 64 y.o.female seen today for follow up of the following medical problems.  1. CAD  - prior STEMI 12/2012, found to have severe multivessel disease on cath. Had emergent CABG 4 vessel (LIMA-LAD, SVG to ramus, SVG to LCX, SVG to RCA. LVEF 40% by LV gram  - repeat echo 04/2013 showed normalized LVEF, 55-60%  - Still with some precordial chest pain at incision site.. Chronic SOB/DOE that is unchanged, no orthopnea, PND or LE edema.  - no cardiac chest pain. notes some left jaw pain. Started 6 months ago. Usually occurs at rest, laying down. Aching pain left jaw, mild nausea. No SOB. Better with moving jaw, notes some popping sensation with jaw movemebnt. Pain lasts for 10-15 minutes   2. Hyperlipidemia  - compliant with statin. Prior side effects on crestor, tolerating atorvastatin well.   3. Tobacco - continues to smoke, reports she is not interested in quitting.   4. HTN - does not check regularly at home - compliant with meds Past Medical History  Diagnosis Date  . Complication of anesthesia   . Depression   . Anxiety   . Myocardial infarction      Allergies  Allergen Reactions  . Black Pepper [Piper] Hives, Itching and Rash  . Other Hives, Itching, Rash and Other (See Comments)    METALS  . Tape Hives, Itching and Rash     Current Outpatient Prescriptions  Medication Sig Dispense Refill  . aspirin EC 81 MG tablet Take 81 mg by mouth daily.    [name] atorvastatin (LIPITOR) 80 MG tablet Take 1 tablet (80 mg total) by mouth daily. 30 tablet 6  . carvedilol (COREG) 3.125 MG tablet Take 1 tablet (3.125 mg total) by mouth 2 (two) times daily. 60 tablet 6  . clonazePAM (KLONOPIN) 1 MG tablet Take 1 mg by mouth 3 (three) times daily as needed for anxiety.    [name] FLUoxetine (PROZAC) 40 MG capsule Take 40 mg by mouth daily.    Nyoka Cowden Tea, Camillia sinensis, (GREEN TEA PO) Take 1 tablet by mouth 3 (three) times daily.    [name]  HYDROcodone-acetaminophen (NORCO) 7.5-325 MG per tablet Take 1 tablet by mouth 3 (three) times daily as needed for moderate pain.     [name] lactobacillus acidophilus (BACID) TABS tablet Take 2 tablets by mouth daily.    [name] lisinopril (PRINIVIL,ZESTRIL) 5 MG tablet Take 1 tablet (5 mg total) by mouth daily. 30 tablet 6  . Multiple Vitamin (MULTIVITAMIN) tablet Take 1 tablet by mouth daily.     No current facility-administered medications for this visit.     Past Surgical History  Procedure Laterality Date  . Abdominal hysterectomy    . Back surgery    . Cholecystectomy    . Spinal fusion      x 2  . Tonsillectomy      age 37  . Appendectomy    . Exploratory laparotomy    . Tubal ligation    . Orif ankle fracture    . Breast lumpectomy    . Coronary artery bypass graft N/A 01/22/2013    Procedure: Coronary Artery Bypass Grafting Times Four Using Left Internal Mammary Artery and Right Saphenous Leg Vein Harvested Endoscopically;  Surgeon: Grace Isaac, MD;  Location: [facility];  Service: Open Heart Surgery;  Laterality: N/A;  . Colonoscopy N/A 11/18/2013    Procedure: COLONOSCOPY;  Surgeon: Rogene Houston, MD;  [facility];  Service: Endoscopy;  Laterality: N/A;  1200  . Left heart catheterization with coronary angiogram  01/22/2013    Procedure: LEFT HEART CATHETERIZATION WITH CORONARY ANGIOGRAM;  Surgeon: Lorretta Harp, MD;  Location: [facility];  Service: Cardiovascular;;     Allergies  Allergen Reactions  . Black Pepper [Piper] Hives, Itching and Rash  . Other Hives, Itching, Rash and Other (See Comments)    METALS  . Tape Hives, Itching and Rash      Family History  Problem Relation Age of Onset  . Heart disease       Social History [name] reports that she has been smoking Cigarettes and E-cigarettes.  She started smoking about 49 years ago. She has a 25 pack-year smoking history. She has never used smokeless tobacco. [name] reports that she does  not drink alcohol.   Review of Systems CONSTITUTIONAL: No weight loss, fever, chills, weakness or fatigue.  HEENT: Eyes: No visual loss, blurred vision, double vision or yellow sclerae.No hearing loss, sneezing, congestion, runny nose or sore throat.  SKIN: No rash or itching.  CARDIOVASCULAR: per HPI RESPIRATORY: No shortness of breath, cough or sputum.  GASTROINTESTINAL: No anorexia, nausea, vomiting or diarrhea. No abdominal pain or blood.  GENITOURINARY: No burning on urination, no polyuria NEUROLOGICAL: No headache, dizziness, syncope, paralysis, ataxia, numbness or tingling in the extremities. No change in bowel or bladder control.  MUSCULOSKELETAL: chest wall pain.  LYMPHATICS: No enlarged nodes. No history of splenectomy.  PSYCHIATRIC: No history of depression or anxiety.  ENDOCRINOLOGIC: No reports of sweating, cold or heat intolerance. No polyuria or polydipsia.  [name]   Physical Examination p 61 bp 114/66 Wt 135 lbs BMI 23 Gen: resting comfortably, no acute distress HEENT: no scleral icterus, pupils equal round and reactive, no palptable cervical adenopathy,  CV: RRR, no m/r/g, no JVD Resp: Clear to auscultation bilaterally GI: abdomen is soft, non-tender, non-distended, normal bowel sounds, no hepatosplenomegaly MSK: extremities are warm, no edema.  Skin: warm, no rash Neuro:  no focal deficits Psych: appropriate affect   Diagnostic Studies  04/2013 Echo LVEF 97-41%, grade I diastolic dysfunction, mild MR    Assessment and Plan   1. CAD - s/p CABG in 12/2012  - LVEF decreased to 40% at time of bypass, repeat echo shows LVEF has normalized - continue current meds, she will stop plavix at the end of December  2. Hyperlipidemia  - continue high dose statin in setting of known CAD  3. Tobacco  - counseled on health benefits of cessation, she is not interested in quitting at this time.   4. HTN - at goal, continue current meds  5. Chest wall pain - will  check CXR     Arnoldo Lenis, M.D.

## 2014-04-17 NOTE — Patient Instructions (Signed)
   Chest x-ray  Office will contact with results via phone or letter.   Continue all current medications. Your physician wants you to follow up in: 6 months.  You will receive a reminder letter in the mail one-two months in advance.  If you don't receive a letter, please call our office to schedule the follow up appointment

## 2014-04-21 ENCOUNTER — Telehealth: Payer: Self-pay | Admitting: *Deleted

## 2014-04-21 NOTE — Telephone Encounter (Signed)
-----   Message from Arnoldo Lenis, MD sent at 04/21/2014  2:04 PM EDT ----- CXR is normal. We will continue to monitor the pain at her surgical site for now. If continues will need to get f/u with CT surgery Zandra Abts MD

## 2014-04-21 NOTE — Telephone Encounter (Signed)
Pt made aware, forwarded to DR. Woody Seller

## 2014-10-30 ENCOUNTER — Ambulatory Visit (INDEPENDENT_AMBULATORY_CARE_PROVIDER_SITE_OTHER): Payer: 59 | Admitting: Cardiology

## 2014-10-30 ENCOUNTER — Encounter: Payer: Self-pay | Admitting: Cardiology

## 2014-10-30 ENCOUNTER — Telehealth: Payer: Self-pay | Admitting: Cardiology

## 2014-10-30 VITALS — BP 99/67 | HR 62 | Ht 64.0 in | Wt 141.0 lb

## 2014-10-30 DIAGNOSIS — R5383 Other fatigue: Secondary | ICD-10-CM

## 2014-10-30 DIAGNOSIS — R739 Hyperglycemia, unspecified: Secondary | ICD-10-CM

## 2014-10-30 DIAGNOSIS — E785 Hyperlipidemia, unspecified: Secondary | ICD-10-CM

## 2014-10-30 DIAGNOSIS — I2510 Atherosclerotic heart disease of native coronary artery without angina pectoris: Secondary | ICD-10-CM | POA: Diagnosis not present

## 2014-10-30 DIAGNOSIS — R0989 Other specified symptoms and signs involving the circulatory and respiratory systems: Secondary | ICD-10-CM

## 2014-10-30 DIAGNOSIS — R0602 Shortness of breath: Secondary | ICD-10-CM

## 2014-10-30 NOTE — Patient Instructions (Signed)
Your physician has requested that you have a carotid duplex. This test is an ultrasound of the carotid arteries in your neck. It looks at blood flow through these arteries that supply the brain with blood. Allow one hour for this exam. There are no restrictions or special instructions. Your physician has recommended that you have a pulmonary function test. Pulmonary Function Tests are a group of tests that measure how well air moves in and out of your lungs. Labs for CMET, Magnesium, CBC, FLP, HgA1C, TSH - Reminder:  Nothing to eat or drink after 12 midnight prior to labs. Office will contact with results via phone or letter.   Continue all current medications. Your physician wants you to follow up in: 6 months.  You will receive a reminder letter in the mail one-two months in advance.  If you don't receive a letter, please call our office to schedule the follow up appointment

## 2014-10-30 NOTE — Progress Notes (Signed)
Patient ID: EVALEIGH MCCAMY, female   DOB: [birth year], 64 y.o.   MRN: [id number]     Clinical Summary Ms. Hawkey is a 64 y.o.female seen today for follow up of the following medical problems.   1. CAD  - prior STEMI 12/2012, found to have severe multivessel disease on cath. Had emergent CABG 4 vessel (LIMA-LAD, SVG to ramus, SVG to LCX, SVG to RCA. LVEF 40% by LV gram  - repeat echo 04/2013 showed normalized LVEF, 55-60%  - denies any chest pain. Notes some SOB at times.  - compliant with meds   2. Hyperlipidemia  - compliant with statin. Prior side effects on crestor, tolerating atorvastatin well.   3. HTN - does not check regularly at home - compliant with meds.   4. Cough and SOB - tobacco x 50 years. - chronic cough Past Medical History  Diagnosis Date  . Complication of anesthesia   . Depression   . Anxiety   . Myocardial infarction      Allergies  Allergen Reactions  . Black Pepper [Piper] Hives, Itching and Rash  . Other Hives, Itching, Rash and Other (See Comments)    METALS  . Tape Hives, Itching and Rash     Current Outpatient Prescriptions  Medication Sig Dispense Refill  . aspirin EC 81 MG tablet Take 81 mg by mouth daily.    [name] atorvastatin (LIPITOR) 80 MG tablet Take 1 tablet (80 mg total) by mouth daily. 30 tablet 6  . carvedilol (COREG) 3.125 MG tablet Take 1 tablet (3.125 mg total) by mouth 2 (two) times daily. 60 tablet 6  . clonazePAM (KLONOPIN) 1 MG tablet Take 1 mg by mouth 3 (three) times daily as needed for anxiety.    [name] FLUoxetine (PROZAC) 40 MG capsule Take 40 mg by mouth daily.    Nyoka Cowden Tea, Camillia sinensis, (GREEN TEA PO) Take 1 tablet by mouth 3 (three) times daily.    [name] HYDROcodone-acetaminophen (NORCO) 7.5-325 MG per tablet Take 1 tablet by mouth 3 (three) times daily as needed for moderate pain.     [name] lactobacillus acidophilus (BACID) TABS tablet Take 2 tablets by mouth daily.    [name] lisinopril (PRINIVIL,ZESTRIL) 5 MG tablet  Take 1 tablet (5 mg total) by mouth daily. 30 tablet 6  . Multiple Vitamin (MULTIVITAMIN) tablet Take 1 tablet by mouth daily.     No current facility-administered medications for this visit.     Past Surgical History  Procedure Laterality Date  . Abdominal hysterectomy    . Back surgery    . Cholecystectomy    . Spinal fusion      x 2  . Tonsillectomy      age 64  . Appendectomy    . Exploratory laparotomy    . Tubal ligation    . Orif ankle fracture    . Breast lumpectomy    . Coronary artery bypass graft N/A 01/22/2013    Procedure: Coronary Artery Bypass Grafting Times Four Using Left Internal Mammary Artery and Right Saphenous Leg Vein Harvested Endoscopically;  Surgeon: Grace Isaac, MD;  Location: [facility];  Service: Open Heart Surgery;  Laterality: N/A;  . Colonoscopy N/A 11/18/2013    Procedure: COLONOSCOPY;  Surgeon: Rogene Houston, MD;  [facility];  Service: Endoscopy;  Laterality: N/A;  1200  . Left heart catheterization with coronary angiogram  01/22/2013    Procedure: LEFT HEART CATHETERIZATION WITH CORONARY ANGIOGRAM;  Surgeon: Lorretta Harp, MD;  Location: [facility];  Service: Cardiovascular;;     Allergies  Allergen Reactions  . Black Pepper [Piper] Hives, Itching and Rash  . Other Hives, Itching, Rash and Other (See Comments)    METALS  . Tape Hives, Itching and Rash      Family History  Problem Relation Age of Onset  . Heart disease       Social History Ms. Dunsmore reports that she has been smoking Cigarettes and E-cigarettes.  She started smoking about 49 years ago. She has a 25 pack-year smoking history. She has never used smokeless tobacco. Ms. Henriquez reports that she does not drink alcohol.   Review of Systems CONSTITUTIONAL: No weight loss, fever, chills, weakness or fatigue.  HEENT: Eyes: No visual loss, blurred vision, double vision or yellow sclerae.No hearing loss, sneezing, congestion, runny nose or sore  throat.  SKIN: No rash or itching.  CARDIOVASCULAR: per HPI RESPIRATORY: +cough GASTROINTESTINAL: No anorexia, nausea, vomiting or diarrhea. No abdominal pain or blood.  GENITOURINARY: No burning on urination, no polyuria NEUROLOGICAL: No headache, dizziness, syncope, paralysis, ataxia, numbness or tingling in the extremities. No change in bowel or bladder control.  MUSCULOSKELETAL: No muscle, back pain, joint pain or stiffness.  LYMPHATICS: No enlarged nodes. No history of splenectomy.  PSYCHIATRIC: No history of depression or anxiety.  ENDOCRINOLOGIC: No reports of sweating, cold or heat intolerance. No polyuria or polydipsia.  [name]   Physical Examination Filed Vitals:   10/30/14 1309  BP: 99/67  Pulse: 62   Filed Vitals:   10/30/14 1309  Height: 5\' 4"  (1.626 m)  Weight: 141 lb (63.957 kg)    Gen: resting comfortably, no acute distress HEENT: no scleral icterus, pupils equal round and reactive, no palptable cervical adenopathy,  CV: RRR, no m/r/g, no jvd. Left carotid bruit Resp: Clear to auscultation bilaterally GI: abdomen is soft, non-tender, non-distended, normal bowel sounds, no hepatosplenomegaly MSK: extremities are warm, no edema.  Skin: warm, no rash Neuro:  no focal deficits Psych: appropriate affect   Diagnostic Studies  04/2013 Echo LVEF 93-81%, grade I diastolic dysfunction, mild MR    Assessment and Plan   1. CAD - s/p CABG in 12/2012  - LVEF decreased to 40% at time of bypass, repeat echo shows LVEF has normalized - no current symptoms - continue current meds  2. Hyperlipidemia  - continue high dose statin in setting of known CAD  3. Cough/SOB - 50 year tobacco history, will obtain PFTs.   4. HTN - at goal, continue current meds  5. Carotid bruit - obtain carotid US  F/u 6 months. Obtain annual labs.   Arnoldo Lenis, M.D.

## 2014-10-30 NOTE — Telephone Encounter (Signed)
PFT'S scheduled for 11-03-14 at [facility]. Checking percert

## 2014-10-30 NOTE — Telephone Encounter (Signed)
No precert required for PFT.

## 2014-11-03 ENCOUNTER — Ambulatory Visit (HOSPITAL_COMMUNITY)
Admission: RE | Admit: 2014-11-03 | Discharge: 2014-11-03 | Disposition: A | Discharge status: Home / Self Care | Payer: 59 | Source: Ambulatory Visit | Attending: Cardiology | Admitting: Cardiology

## 2014-11-03 DIAGNOSIS — R0602 Shortness of breath: Secondary | ICD-10-CM

## 2014-11-03 LAB — PULMONARY FUNCTION TEST
DL/VA % pred: 60 %
DL/VA: 2.9 ml/min/mmHg/L
DLCO UNC: 10.16 ml/min/mmHg
DLCO unc % pred: 41 %
FEF 25-75 Post: 0.91 L/sec
FEF 25-75 Pre: 0.59 L/sec
FEF2575-%Change-Post: 52 %
FEF2575-%PRED-POST: 42 %
FEF2575-%PRED-PRE: 27 %
FEV1-%Change-Post: 14 %
FEV1-%PRED-POST: 55 %
FEV1-%Pred-Pre: 48 %
FEV1-POST: 1.35 L
FEV1-Pre: 1.18 L
FEV1FVC-%CHANGE-POST: 9 %
FEV1FVC-%PRED-PRE: 78 %
FEV6-%CHANGE-POST: 5 %
FEV6-%Pred-Post: 66 %
FEV6-%Pred-Pre: 63 %
FEV6-PRE: 1.92 L
FEV6-Post: 2.02 L
FEV6FVC-%CHANGE-POST: 1 %
FEV6FVC-%PRED-PRE: 102 %
FEV6FVC-%Pred-Post: 103 %
FVC-%Change-Post: 4 %
FVC-%Pred-Post: 64 %
FVC-%Pred-Pre: 61 %
FVC-Post: 2.04 L
FVC-Pre: 1.95 L
POST FEV1/FVC RATIO: 66 %
PRE FEV1/FVC RATIO: 60 %
Post FEV6/FVC ratio: 100 %
Pre FEV6/FVC Ratio: 99 %
RV % PRED: 138 %
RV: 2.88 L
TLC % pred: 97 %
TLC: 4.91 L

## 2014-11-03 MED ORDER — ALBUTEROL SULFATE (2.5 MG/3ML) 0.083% IN NEBU
2.5000 mg | INHALATION_SOLUTION | Freq: Once | RESPIRATORY_TRACT | Status: AC
  Administered 2014-11-03: 2.5 mg via RESPIRATORY_TRACT

## 2014-11-08 ENCOUNTER — Ambulatory Visit (INDEPENDENT_AMBULATORY_CARE_PROVIDER_SITE_OTHER): Payer: 59

## 2014-11-08 DIAGNOSIS — R0989 Other specified symptoms and signs involving the circulatory and respiratory systems: Secondary | ICD-10-CM

## 2014-11-09 ENCOUNTER — Telehealth: Payer: Self-pay | Admitting: *Deleted

## 2014-11-09 MED ORDER — TIOTROPIUM BROMIDE MONOHYDRATE 18 MCG IN CAPS: 18.0000 ug | ORAL_CAPSULE | Freq: Every day | RESPIRATORY_TRACT | Status: DC

## 2014-11-09 NOTE — Telephone Encounter (Signed)
-----   Message from Arnoldo Lenis, MD sent at 11/08/2014  2:03 PM EDT ----- Labs show her cholesterol is very high. Is she taking her lipitor EVERY day. Crestor is the only statin that is stronger and she did not tolerate it before. Please make sure she is taking atorvastatin 80mg  daily. Her diabetes test also came back abnormal, and shows she has early diabetes, I believe this is new for her. Please forward to her pcp, she will need to likely start some medicine  Christine Abts MD

## 2014-11-09 NOTE — Telephone Encounter (Signed)
Pt clarified taking atorvastatin 80 mg daily. Pt understood we would route labs to pcp for f/u on HgbA1c.

## 2014-11-09 NOTE — Telephone Encounter (Signed)
Pt aware, routed to pcp 

## 2014-11-09 NOTE — Telephone Encounter (Signed)
-----   Message from Arnoldo Lenis, MD sent at 11/08/2014  2:06 PM EDT ----- Breathing tests show severe COPD, this explains her SOB. Please start spiriva 18 mcg daily and albuterol 2 puffs q6 hrs prn, and forward test results to pcp to take over management from here.  Zandra Abts MD

## 2014-11-09 NOTE — Telephone Encounter (Signed)
Pt agreeable to start spiriva daily, currently using albuterol by Dr. Woody Seller, forwarded results

## 2014-11-09 NOTE — Telephone Encounter (Signed)
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

## 2015-04-05 ENCOUNTER — Encounter (HOSPITAL_COMMUNITY): Payer: Self-pay | Admitting: Psychiatry

## 2015-04-05 ENCOUNTER — Ambulatory Visit (INDEPENDENT_AMBULATORY_CARE_PROVIDER_SITE_OTHER): Payer: Medicare HMO | Admitting: Psychiatry

## 2015-04-05 VITALS — BP 110/74 | HR 69 | Ht 64.0 in | Wt 146.6 lb

## 2015-04-05 DIAGNOSIS — F332 Major depressive disorder, recurrent severe without psychotic features: Secondary | ICD-10-CM | POA: Diagnosis not present

## 2015-04-05 MED ORDER — DULOXETINE HCL 60 MG PO CPEP: 60.0000 mg | ORAL_CAPSULE | Freq: Every day | ORAL | Status: DC

## 2015-04-05 MED ORDER — DIAZEPAM 10 MG PO TABS: 10.0000 mg | ORAL_TABLET | Freq: Three times a day (TID) | ORAL | Status: DC
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
## 2015-04-06 ENCOUNTER — Telehealth (HOSPITAL_COMMUNITY): Payer: Self-pay | Admitting: *Deleted

## 2015-04-06 NOTE — Telephone Encounter (Signed)
Cancel clonazepam

## 2015-04-06 NOTE — Telephone Encounter (Signed)
phone call from [facility], said Dr. Harrington Challenger gave Diazepam 10 mg. 3 times a day, and patient got Clonazepam 1 mg. three times a day from Dr. Woody Seller on 04/02/15.   Please call pharmacy for instructions.

## 2015-04-06 NOTE — Telephone Encounter (Signed)
Per pharmacy and informed them to cancel the Clonazepam and Katie verbalized understanding. Per Joellen Jersey the Pharmacist, she just need Authorization to fill the Valium because the Klonopin was just filled on March 6th.

## 2015-04-06 NOTE — Telephone Encounter (Signed)
Informed pt pharmacy Joellen Jersey) of what provider stated.

## 2015-04-06 NOTE — Telephone Encounter (Signed)
Spoke with pt pharmacy [facility]) about voicemail that was left. Per Gabriel Cirri, they have on their record that pt received Clonazepam on 90 tablet on 04-02-15 from Dr. Woody Seller. Per Gabriel Cirri from [employer], should they go ahead and fill pt Valium. Pharmacy number is [phone].

## 2015-04-06 NOTE — Telephone Encounter (Signed)
They can fill valium. Please inform pt not to take clonazepam

## 2015-04-30 ENCOUNTER — Ambulatory Visit (INDEPENDENT_AMBULATORY_CARE_PROVIDER_SITE_OTHER): Payer: Medicare HMO | Admitting: Cardiology

## 2015-04-30 ENCOUNTER — Encounter: Payer: Self-pay | Admitting: *Deleted

## 2015-04-30 ENCOUNTER — Encounter: Payer: Self-pay | Admitting: Cardiology

## 2015-04-30 VITALS — BP 101/65 | HR 68 | Ht 64.0 in | Wt 149.8 lb

## 2015-04-30 DIAGNOSIS — E785 Hyperlipidemia, unspecified: Secondary | ICD-10-CM | POA: Diagnosis not present

## 2015-04-30 DIAGNOSIS — R7309 Other abnormal glucose: Secondary | ICD-10-CM

## 2015-04-30 DIAGNOSIS — I10 Essential (primary) hypertension: Secondary | ICD-10-CM

## 2015-04-30 DIAGNOSIS — I2510 Atherosclerotic heart disease of native coronary artery without angina pectoris: Secondary | ICD-10-CM | POA: Diagnosis not present

## 2015-04-30 MED ORDER — LISINOPRIL 2.5 MG PO TABS: 2.5000 mg | ORAL_TABLET | Freq: Every day | ORAL | Status: DC

## 2015-04-30 NOTE — Progress Notes (Signed)
Patient ID: MYSTIC RODD, female   DOB: Jan 09, 1951, 65 y.o.   MRN: [id number]     Clinical Summary Ms. Placeres is a 65 y.o.female seen today for follow up of the following medical problems.   1. CAD  - prior STEMI 12/2012, found to have severe multivessel disease on cath. Had emergent CABG 4 vessel (LIMA-LAD, SVG to ramus, SVG to LCX, SVG to RCA. LVEF 40% by LV gram  - repeat echo 04/2013 showed normalized LVEF, 55-60%   - February episodes of chest pain. Pressure/tightness like feeling around entire chest, 2/10 initially and episodes progressed to 8/10 in seviety over time. Occur at rest or with exertion. Can be worst with position. No relation to food. Can last up to 5-6 hours constant without relief. - since [place] however she has not had any recurrent symptoms.   2. Hyperlipidemia  10/2014: TC 329 TG 280 HDL 41 LDL 232 - compliant with statin. Prior side effects on crestor, tolerating atorvastatin well.  - reports she eats a lot of sweets  3. HTN - does not check regularly at home - compliant with meds.   4. COPD - PFTs 10/2014 with severe COPD - she had spiriva with some improvement, but no longer can afford.    5. Carotid stenosis - [place] 10/2014 with bilateral ICA 1-39% disease.  - no recent neuro symptoms  6. DM2 - HgbA1c 6.6. - followed by pcp   7. Orthostatic dizziness - drinks mountain dews x 4 cans per day. Drinks water glasses x 3-4 daily  Past Medical History  Diagnosis Date  . Complication of anesthesia   . Depression   . Anxiety   . Myocardial infarction (HCC)      Allergies  Allergen Reactions  . Black Pepper [Piper] Hives, Itching and Rash  . Other Hives, Itching, Rash and Other (See Comments)    METALS  . Tape Hives, Itching and Rash     Current Outpatient Prescriptions  Medication Sig Dispense Refill  . aspirin EC 81 MG tablet Take 81 mg by mouth daily.    [name] atorvastatin (LIPITOR) 80 MG tablet Take 1 tablet (80 mg total) by  mouth daily. 30 tablet 6  . carvedilol (COREG) 3.125 MG tablet Take 1 tablet (3.125 mg total) by mouth 2 (two) times daily. 60 tablet 6  . cyclobenzaprine (FLEXERIL) 10 MG tablet Take 1 tablet by mouth at bedtime.    . diazepam (VALIUM) 10 MG tablet Take 1 tablet (10 mg total) by mouth 3 (three) times daily. 90 tablet 2  . DULoxetine (CYMBALTA) 60 MG capsule Take 1 capsule (60 mg total) by mouth daily. 30 capsule 2  . Green Tea, Camillia sinensis, (GREEN TEA PO) Take 1 tablet by mouth 3 (three) times daily.    [name] HYDROcodone-acetaminophen (NORCO) 7.5-325 MG per tablet Take 1 tablet by mouth every 6 (six) hours as needed for moderate pain.     [name] lisinopril (PRINIVIL,ZESTRIL) 5 MG tablet Take 1 tablet (5 mg total) by mouth daily. 30 tablet 6  . Multiple Vitamin (MULTIVITAMIN) tablet Take 1 tablet by mouth daily.    . VENTOLIN HFA 108 (90 BASE) MCG/ACT inhaler Inhale 2 puffs into the lungs 3 (three) times daily as needed.     No current facility-administered medications for this visit.     Past Surgical History  Procedure Laterality Date  . Abdominal hysterectomy    . Back surgery    . Cholecystectomy    . Spinal fusion  x 2  . Tonsillectomy      age 16  . Appendectomy    . Exploratory laparotomy    . Tubal ligation    . Orif ankle fracture    . Breast lumpectomy    . Coronary artery bypass graft N/A 01/22/2013    Procedure: Coronary Artery Bypass Grafting Times Four Using Left Internal Mammary Artery and Right Saphenous Leg Vein Harvested Endoscopically;  Surgeon: Grace Isaac, MD;  Location: [place];  Service: Open Heart Surgery;  Laterality: N/A;  . Colonoscopy N/A 11/18/2013    Procedure: COLONOSCOPY;  Surgeon: Rogene Houston, MD;  [facility];  Service: Endoscopy;  Laterality: N/A;  1200  . Left heart catheterization with coronary angiogram  01/22/2013    Procedure: LEFT HEART CATHETERIZATION WITH CORONARY ANGIOGRAM;  Surgeon: Lorretta Harp, MD;  Location: [facility]  CATH LAB;  Service: Cardiovascular;;     Allergies  Allergen Reactions  . Black Pepper [Piper] Hives, Itching and Rash  . Other Hives, Itching, Rash and Other (See Comments)    METALS  . Tape Hives, Itching and Rash      Family History  Problem Relation Age of Onset  . Heart disease    . Alcohol abuse Father   . Schizophrenia Father   . Alcohol abuse Sister   . Alcohol abuse Brother      Social History Ms. Khun reports that she has been smoking Cigarettes and E-cigarettes.  She started smoking about 50 years ago. She has a 25 pack-year smoking history. She has never used smokeless tobacco. Ms. Mangieri reports that she does not drink alcohol.   Review of Systems CONSTITUTIONAL: No weight loss, fever, chills, weakness or fatigue.  HEENT: Eyes: No visual loss, blurred vision, double vision or yellow sclerae.No hearing loss, sneezing, congestion, runny nose or sore throat.  SKIN: No rash or itching.  CARDIOVASCULAR: per HPI RESPIRATORY: No shortness of breath, cough or sputum.  GASTROINTESTINAL: No anorexia, nausea, vomiting or diarrhea. No abdominal pain or blood.  GENITOURINARY: No burning on urination, no polyuria NEUROLOGICAL: dizziness with standing MUSCULOSKELETAL: No muscle, back pain, joint pain or stiffness.  LYMPHATICS: No enlarged nodes. No history of splenectomy.  PSYCHIATRIC: No history of depression or anxiety.  ENDOCRINOLOGIC: No reports of sweating, cold or heat intolerance. No polyuria or polydipsia.  [name]   Physical Examination Filed Vitals:   04/30/15 1411  BP: 101/65  Pulse: 68   Filed Vitals:   04/30/15 1411  Height: 5\' 4"  (1.626 m)  Weight: 149 lb 12.8 oz (67.949 kg)    Gen: resting comfortably, no acute distress HEENT: no scleral icterus, pupils equal round and reactive, no palptable cervical adenopathy,  CV: RRR, no m/r/g, no jvd Resp: Clear to auscultation bilaterally GI: abdomen is soft, non-tender, non-distended, normal bowel  sounds, no hepatosplenomegaly MSK: extremities are warm, no edema.  Skin: warm, no rash Neuro:  no focal deficits Psych: appropriate affect   Diagnostic Studies 04/2013 Echo LVEF 0000000, grade I diastolic dysfunction, mild MR  10/2014 PFTs: severe COPD   Assessment and Plan   1. CAD - isolated episodes of chest pain in February that have resolved. Will conitnue to monitor at this time, if reoccurs consider ischemic tesitng.   2. Hyperlipidemia  - continue high dose statin in setting of known CAD - repeat lipid panel - counseled on dietary changes to lower TGs  3. HTN - at goal. Recently having some orthostatic dizziness, we will decrease lisinopril to 2.5mg   daily.    4. Carotid stenosis - mild bilateral disease by [place] 01/2014, continue to monitor   F/u 6 weeks.      Arnoldo Lenis, M.D.

## 2015-04-30 NOTE — Patient Instructions (Signed)
Your physician recommends that you schedule a follow-up appointment in: [place]. [place]  Your physician has recommended you make the following change in your medication:   DECREASE LISINOPRIL 2.5 MG DAILY  Your physician recommends that you return for lab work BMP.HGBA1C.LIPIDS  Thank you for choosing [facility]!!

## 2015-05-03 ENCOUNTER — Ambulatory Visit (INDEPENDENT_AMBULATORY_CARE_PROVIDER_SITE_OTHER): Payer: Medicare HMO | Admitting: Psychiatry

## 2015-05-03 ENCOUNTER — Encounter (HOSPITAL_COMMUNITY): Payer: Self-pay | Admitting: Psychiatry

## 2015-05-03 VITALS — BP 104/59 | HR 70 | Ht 64.0 in | Wt 146.0 lb

## 2015-05-03 DIAGNOSIS — F332 Major depressive disorder, recurrent severe without psychotic features: Secondary | ICD-10-CM | POA: Diagnosis not present
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
## 2015-05-08 ENCOUNTER — Ambulatory Visit (INDEPENDENT_AMBULATORY_CARE_PROVIDER_SITE_OTHER): Payer: Medicare HMO | Admitting: Psychiatry

## 2015-05-08 ENCOUNTER — Encounter (HOSPITAL_COMMUNITY): Payer: Self-pay | Admitting: Psychiatry

## 2015-05-08 ENCOUNTER — Ambulatory Visit (HOSPITAL_COMMUNITY): Payer: Self-pay | Admitting: Psychiatry

## 2015-05-08 DIAGNOSIS — F332 Major depressive disorder, recurrent severe without psychotic features: Secondary | ICD-10-CM | POA: Diagnosis not present

## 2015-05-08 NOTE — Patient Instructions (Signed)
Discussed orally 
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
## 2015-05-16 ENCOUNTER — Telehealth (HOSPITAL_COMMUNITY): Payer: Self-pay | Admitting: *Deleted

## 2015-05-16 NOTE — Telephone Encounter (Signed)
Stopped Cymbalta three days ago.  said it was making her stagger and fall, she didn't feel right, as if the floor was tilted, she also said she had no memory.   She said Dr. Woody Seller did not want her to take the Cymbalta.  She is not falling now that she stopped the Cymbalta.   She said she don't think some things will ever go away, such as her ex-husband.

## 2015-05-16 NOTE — Telephone Encounter (Addendum)
Pt called and stated she stopped her Cymbalta 3 days ago. Pt stated the medication was causing her to stagger and fall. Per pt, she fell several times and felt like the floor was tilting. Per pt Dr. Katherine Roan do not want her to take the Cymbalta but did not tell her why. Per pt, after stopping the Cymbalta, those symptoms went away but she still felt like depressed.  Per pt, all she wants to do is cry or sleep. [phone]. Per pt, afraid to be alone. Suicidal to self and felt that way yesterday but not today as of now because her husband is at home. Asked pt if she had a plan and she stated the Best way to use a butcher knife. Asked pt if her husband will be going out today for her to be alone and she stated she did not know. Just feel tire of it all and just wants to end it all. Spoke with pt husband and husband is aware of this and states he don't think she needs to go to the ER right this moment she doesn't look like it and he have seen her a whole lot worse. Right now she's calm he will not take her to the ED. Pt came on phone and she indicated again that if she is left a lone she'll be a danger to self but she's fine right now. Came back on phone with pt and she then stated she was fine and did not need to go to the ED. Informed pt if she feels this way again to please feel free to go to the ER and pt agreed.

## 2015-05-16 NOTE — Telephone Encounter (Signed)
Stopped Cymbalta three days ago.  said it was making her stagger and fall, she didn't feel right, as if the floor was tilted, she also said she had no memory.   She said Dr. Woody Seller did not want her to take the Cymbalta.  She is not falling now that she stopped the Cymbalta.  She said she don't think she is going to feel better soon because some things are never going away, mostly her ex-husband.

## 2015-05-17 ENCOUNTER — Encounter: Payer: Self-pay | Admitting: *Deleted

## 2015-05-17 ENCOUNTER — Telehealth (HOSPITAL_COMMUNITY): Payer: Self-pay | Admitting: *Deleted

## 2015-05-17 NOTE — Telephone Encounter (Signed)
Opened in Error.

## 2015-05-25 ENCOUNTER — Ambulatory Visit (INDEPENDENT_AMBULATORY_CARE_PROVIDER_SITE_OTHER): Payer: Medicare HMO | Admitting: Psychiatry

## 2015-05-25 ENCOUNTER — Encounter (HOSPITAL_COMMUNITY): Payer: Self-pay | Admitting: Psychiatry

## 2015-05-25 DIAGNOSIS — F332 Major depressive disorder, recurrent severe without psychotic features: Secondary | ICD-10-CM

## 2015-05-25 NOTE — Patient Instructions (Signed)
Discussed orally 
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
## 2015-05-28 ENCOUNTER — Telehealth (HOSPITAL_COMMUNITY): Payer: Self-pay | Admitting: *Deleted

## 2015-05-28 ENCOUNTER — Ambulatory Visit (HOSPITAL_COMMUNITY): Payer: Self-pay | Admitting: Psychiatry

## 2015-05-28 NOTE — Telephone Encounter (Signed)
Called pt back due to previous message that was left on voicemail. Per pt, she will not be returning to office and to cancel all of her appts at this office. Per pt, whenever she comes to office, she don't feel like she's getting anywhere and it just makes her more aggravated. Per pt, Per pt, she called office to only let them know that she will not be taking her medication that Dr. Harrington Challenger just recently put her on because it was not working and was put on hold and the police was sent to her house and all she said was she was not going to take her medication. Per pt, she have not had any medications since then. Then pt stated she's been taking left over Prozac. Per pt, when she was in office seeing her therapist and all she was trying to do was get her on new medications before she left the office. But was told that Dr. Harrington Challenger wanted her to come back to office before she could change her to another medication. Per pt, she sat in her car and watched Dr. Harrington Challenger get in her car to go to lunch and she could not just take that few minutes to give her a new medication. Per pt, she can't keep driving from the other side of [place] to come to [place] for an appt and paying $45.00 every time. Informed pt that office could bill her if she did not have her copay that visit and pt stated she don't need another bill. Per pt, she was told by her therapist that there was no notes on chart stated she had called earlier about her medication not working. Tried to see if pt would like to come back to talk over it with provider and she stated that she will think about it.

## 2015-05-28 NOTE — Telephone Encounter (Signed)
noted 

## 2015-05-28 NOTE — Telephone Encounter (Signed)
Is she coming or not?

## 2015-05-28 NOTE — Telephone Encounter (Signed)
Spoke with pt and she stated she will not be coming

## 2015-05-28 NOTE — Telephone Encounter (Signed)
Pt called back and stated she tried to getting a ride to come to office and was unable to find someone to bring her. Per pt, she can answer questions over the phone as to why she is not coming. Informed pt office put the reason as to why she's not coming back to office in her chart during previous visit. Per pt, she is not happy that she have to come to office in order to get another medication although she stopped her previous medication.

## 2015-05-28 NOTE — Telephone Encounter (Signed)
voice message from patient on 05/27/15, said to cancel all her appointments.  She will not be coming back.

## 2015-05-31 ENCOUNTER — Telehealth: Payer: Self-pay | Admitting: *Deleted

## 2015-05-31 NOTE — Telephone Encounter (Signed)
Pt aware, routed to pcp 

## 2015-05-31 NOTE — Telephone Encounter (Signed)
-----   Message from Arnoldo Lenis, MD sent at 05/31/2015  1:15 PM EDT ----- Labs show her triglycerides remain too high, she needs to work to cut back on fatty foods and sweets to improve this number  Zandra Abts MD

## 2015-06-08 ENCOUNTER — Ambulatory Visit (HOSPITAL_COMMUNITY): Payer: Self-pay | Admitting: Psychiatry

## 2015-06-13 ENCOUNTER — Ambulatory Visit (HOSPITAL_COMMUNITY): Payer: Self-pay | Admitting: Psychiatry

## 2015-06-14 ENCOUNTER — Ambulatory Visit (INDEPENDENT_AMBULATORY_CARE_PROVIDER_SITE_OTHER): Payer: Medicare HMO | Admitting: Cardiology

## 2015-06-14 ENCOUNTER — Encounter: Payer: Self-pay | Admitting: Cardiology

## 2015-06-14 VITALS — BP 122/76 | HR 74 | Ht 64.0 in | Wt 145.8 lb

## 2015-06-14 DIAGNOSIS — I2510 Atherosclerotic heart disease of native coronary artery without angina pectoris: Secondary | ICD-10-CM | POA: Diagnosis not present

## 2015-06-14 DIAGNOSIS — I10 Essential (primary) hypertension: Secondary | ICD-10-CM | POA: Diagnosis not present

## 2015-06-14 DIAGNOSIS — E785 Hyperlipidemia, unspecified: Secondary | ICD-10-CM

## 2015-06-14 DIAGNOSIS — I6523 Occlusion and stenosis of bilateral carotid arteries: Secondary | ICD-10-CM

## 2015-06-14 NOTE — Patient Instructions (Signed)

## 2015-06-14 NOTE — Progress Notes (Signed)
Patient ID: [name], female   DOB: [birth year], 65 y.o.   MRN: [id number]     Clinical Summary [name] is a 65 y.o.female seen today for follow up of the following medical problems.   1. CAD  - prior STEMI 12/2012, found to have severe multivessel disease on cath. Had emergent CABG 4 vessel (LIMA-LAD, SVG to ramus, SVG to LCX, SVG to RCA. LVEF 40% by LV gram  - repeat echo 04/2013 showed normalized LVEF, 55-60% - February episodes of chest pain. Pressure/tightness like feeling around entire chest, 2/10 initially and episodes progressed to 8/10 in seviety over time. Occur at rest or with exertion. Can be worst with position. No relation to food. Can last up to 5-6 hours constant without relief. - since [place] however she has not had any recurrent symptoms.   - denies any recent chest pain since our last visit. No SOB or DOE.    2. Hyperlipidemia  10/2014: TC 329 TG 280 HDL 41 LDL 232 - compliant with statin. Prior side effects on crestor, tolerating atorvastatin well.  -reports poor dietary habits, eats a lot of sweets  3. HTN - does not check regularly at home - compliant with meds.   4. COPD - PFTs 10/2014 with severe COPD - she has had difficultly affording her inhalers   5. Carotid stenosis - [place] 10/2014 with bilateral ICA 1-39% disease.  - no recent neuro symptoms   Past Medical History  Diagnosis Date  . Complication of anesthesia   . Depression   . Anxiety   . Myocardial infarction (HCC)      Allergies  Allergen Reactions  . Black Pepper [Piper] Hives, Itching and Rash  . Other Hives, Itching, Rash and Other (See Comments)    METALS  . Tape Hives, Itching and Rash     Current Outpatient Prescriptions  Medication Sig Dispense Refill  . aspirin EC 81 MG tablet Take 81 mg by mouth daily.    [name] atorvastatin (LIPITOR) 80 MG tablet Take 1 tablet (80 mg total) by mouth daily. 30 tablet 6  . carvedilol (COREG) 3.125 MG tablet Take 1 tablet  (3.125 mg total) by mouth 2 (two) times daily. 60 tablet 6  . cyclobenzaprine (FLEXERIL) 10 MG tablet Take 1 tablet by mouth at bedtime.    . diazepam (VALIUM) 10 MG tablet Take 1 tablet (10 mg total) by mouth 3 (three) times daily. 90 tablet 2  . DULoxetine (CYMBALTA) 60 MG capsule Take 1 capsule (60 mg total) by mouth daily. 30 capsule 2  . Green Tea, Camillia sinensis, (GREEN TEA PO) Take 1 tablet by mouth 3 (three) times daily.    [name] HYDROcodone-acetaminophen (NORCO) 7.5-325 MG per tablet Take 1 tablet by mouth every 6 (six) hours as needed for moderate pain.     [name] lisinopril (PRINIVIL,ZESTRIL) 2.5 MG tablet Take 1 tablet (2.5 mg total) by mouth daily. 90 tablet 3  . Multiple Vitamin (MULTIVITAMIN) tablet Take 1 tablet by mouth daily.    . VENTOLIN HFA 108 (90 BASE) MCG/ACT inhaler Inhale 2 puffs into the lungs 3 (three) times daily as needed.     No current facility-administered medications for this visit.     Past Surgical History  Procedure Laterality Date  . Abdominal hysterectomy    . Back surgery    . Cholecystectomy    . Spinal fusion      x 2  . Tonsillectomy      age 25  . Appendectomy    .  Exploratory laparotomy    . Tubal ligation    . Orif ankle fracture    . Breast lumpectomy    . Coronary artery bypass graft N/A 01/22/2013    Procedure: Coronary Artery Bypass Grafting Times Four Using Left Internal Mammary Artery and Right Saphenous Leg Vein Harvested Endoscopically;  Surgeon: Grace Isaac, MD;  Location: [facility];  Service: Open Heart Surgery;  Laterality: N/A;  . Colonoscopy N/A 11/18/2013    Procedure: COLONOSCOPY;  Surgeon: Rogene Houston, MD;  [facility];  Service: Endoscopy;  Laterality: N/A;  1200  . Left heart catheterization with coronary angiogram  01/22/2013    Procedure: LEFT HEART CATHETERIZATION WITH CORONARY ANGIOGRAM;  Surgeon: Lorretta Harp, MD;  Location: [facility] CATH LAB;  Service: Cardiovascular;;     Allergies  Allergen  Reactions  . Black Pepper [Piper] Hives, Itching and Rash  . Other Hives, Itching, Rash and Other (See Comments)    METALS  . Tape Hives, Itching and Rash      Family History  Problem Relation Age of Onset  . Heart disease    . Alcohol abuse Father   . Schizophrenia Father   . Alcohol abuse Sister   . Alcohol abuse Brother      Social History [name] reports that she has been smoking Cigarettes and E-cigarettes.  She started smoking about 50 years ago. She has a 25 pack-year smoking history. She has never used smokeless tobacco. [name] reports that she does not drink alcohol.   Review of Systems CONSTITUTIONAL: No weight loss, fever, chills, weakness or fatigue.  HEENT: Eyes: No visual loss, blurred vision, double vision or yellow sclerae.No hearing loss, sneezing, congestion, runny nose or sore throat.  SKIN: No rash or itching.  CARDIOVASCULAR: per HPI RESPIRATORY: No shortness of breath, cough or sputum.  GASTROINTESTINAL: No anorexia, nausea, vomiting or diarrhea. No abdominal pain or blood.  GENITOURINARY: No burning on urination, no polyuria NEUROLOGICAL: No headache, dizziness, syncope, paralysis, ataxia, numbness or tingling in the extremities. No change in bowel or bladder control.  MUSCULOSKELETAL: No muscle, back pain, joint pain or stiffness.  LYMPHATICS: No enlarged nodes. No history of splenectomy.  PSYCHIATRIC: No history of depression or anxiety.  ENDOCRINOLOGIC: No reports of sweating, cold or heat intolerance. No polyuria or polydipsia.  [name]   Physical Examination Filed Vitals:   06/14/15 1439  BP: 122/76  Pulse: 74   Filed Vitals:   06/14/15 1439  Height: 5\' 4"  (1.626 m)  Weight: 145 lb 12.8 oz (66.134 kg)    Gen: resting comfortably, no acute distress HEENT: no scleral icterus, pupils equal round and reactive, no palptable cervical adenopathy,  CV: RRR, no m/r/g, no jvd Resp: Clear to auscultation bilaterally GI: abdomen is soft,  non-tender, non-distended, normal bowel sounds, no hepatosplenomegaly MSK: extremities are warm, no edema.  Skin: warm, no rash Neuro:  no focal deficits Psych: appropriate affect   Diagnostic Studies 04/2013 Echo LVEF 0000000, grade I diastolic dysfunction, mild MR  10/2014 PFTs: severe COPD    Assessment and Plan   1. CAD - isolated episodes of chest pain in February that have resolved without recurrence - we will continue current meds  2. Hyperlipidemia  - continue high dose statin in setting of known CAD - discussed dietary changes to lower TGs  3. HTN - at goal. She had orthstatic symptoms on higher doses that have resolved, continue to monitor   4. Carotid stenosis - mild bilateral disease  by [place] 01/2014, we will continue to monitor. Cost is an issue for her, we will keep f/u ultrasounds to a minimum.  F/u 6 months     Arnoldo Lenis, M.D.

## 2015-06-22 ENCOUNTER — Ambulatory Visit (HOSPITAL_COMMUNITY): Payer: Self-pay | Admitting: Psychiatry

## 2015-08-21 ENCOUNTER — Emergency Department (HOSPITAL_COMMUNITY)
Admission: EM | Admit: 2015-08-21 | Discharge: 2015-08-22 | Disposition: A | Discharge status: Home / Self Care | Payer: Medicare HMO | Attending: Emergency Medicine | Admitting: Emergency Medicine

## 2015-08-21 ENCOUNTER — Encounter (HOSPITAL_COMMUNITY): Payer: Self-pay | Admitting: Emergency Medicine

## 2015-08-21 DIAGNOSIS — F332 Major depressive disorder, recurrent severe without psychotic features: Secondary | ICD-10-CM | POA: Diagnosis not present

## 2015-08-21 DIAGNOSIS — I252 Old myocardial infarction: Secondary | ICD-10-CM | POA: Diagnosis not present

## 2015-08-21 DIAGNOSIS — F329 Major depressive disorder, single episode, unspecified: Secondary | ICD-10-CM | POA: Insufficient documentation

## 2015-08-21 DIAGNOSIS — F32A Depression, unspecified: Secondary | ICD-10-CM

## 2015-08-21 DIAGNOSIS — E119 Type 2 diabetes mellitus without complications: Secondary | ICD-10-CM | POA: Insufficient documentation

## 2015-08-21 DIAGNOSIS — E785 Hyperlipidemia, unspecified: Secondary | ICD-10-CM | POA: Diagnosis not present

## 2015-08-21 DIAGNOSIS — Z79899 Other long term (current) drug therapy: Secondary | ICD-10-CM | POA: Diagnosis not present

## 2015-08-21 DIAGNOSIS — F17210 Nicotine dependence, cigarettes, uncomplicated: Secondary | ICD-10-CM | POA: Insufficient documentation

## 2015-08-21 DIAGNOSIS — Z7982 Long term (current) use of aspirin: Secondary | ICD-10-CM | POA: Insufficient documentation

## 2015-08-21 LAB — COMPREHENSIVE METABOLIC PANEL
ALK PHOS: 83 U/L (ref 38–126)
ALT: 10 U/L — AB (ref 14–54)
AST: 13 U/L — ABNORMAL LOW (ref 15–41)
Albumin: 4.2 g/dL (ref 3.5–5.0)
Anion gap: 4 — ABNORMAL LOW (ref 5–15)
BILIRUBIN TOTAL: 0.6 mg/dL (ref 0.3–1.2)
BUN: 11 mg/dL (ref 6–20)
CALCIUM: 9.4 mg/dL (ref 8.9–10.3)
CHLORIDE: 103 mmol/L (ref 101–111)
CO2: 30 mmol/L (ref 22–32)
CREATININE: 0.95 mg/dL (ref 0.44–1.00)
Glucose, Bld: 136 mg/dL — ABNORMAL HIGH (ref 65–99)
Potassium: 3.9 mmol/L (ref 3.5–5.1)
Sodium: 137 mmol/L (ref 135–145)
Total Protein: 7.4 g/dL (ref 6.5–8.1)

## 2015-08-21 LAB — CBC
HCT: 44.4 % (ref 36.0–46.0)
Hemoglobin: 14.4 g/dL (ref 12.0–15.0)
MCH: 29.6 pg (ref 26.0–34.0)
MCHC: 32.4 g/dL (ref 30.0–36.0)
MCV: 91.4 fL (ref 78.0–100.0)
PLATELETS: 310 10*3/uL (ref 150–400)
RBC: 4.86 MIL/uL (ref 3.87–5.11)
RDW: 13.6 % (ref 11.5–15.5)
WBC: 8.6 10*3/uL (ref 4.0–10.5)

## 2015-08-21 LAB — URINE MICROSCOPIC-ADD ON: RBC / HPF: NONE SEEN RBC/hpf (ref 0–5)

## 2015-08-21 LAB — URINALYSIS, ROUTINE W REFLEX MICROSCOPIC
BILIRUBIN URINE: NEGATIVE
Glucose, UA: NEGATIVE mg/dL
Hgb urine dipstick: NEGATIVE
Ketones, ur: NEGATIVE mg/dL
NITRITE: NEGATIVE
Protein, ur: NEGATIVE mg/dL
SPECIFIC GRAVITY, URINE: 1.01 (ref 1.005–1.030)
pH: 5.5 (ref 5.0–8.0)

## 2015-08-21 LAB — SALICYLATE LEVEL: Salicylate Lvl: <4 mg/dL (ref 2.8–30.0)

## 2015-08-21 LAB — RAPID URINE DRUG SCREEN, HOSP PERFORMED
Amphetamines: NOT DETECTED
Barbiturates: NOT DETECTED
Benzodiazepines: POSITIVE — AB
COCAINE: NOT DETECTED
OPIATES: POSITIVE — AB
Tetrahydrocannabinol: NOT DETECTED

## 2015-08-21 LAB — ETHANOL

## 2015-08-21 LAB — ACETAMINOPHEN LEVEL: Acetaminophen (Tylenol), Serum: <10 ug/mL — ABNORMAL LOW (ref 10–30)

## 2015-08-21 MED ORDER — ASPIRIN EC 81 MG PO TBEC
81.0000 mg | DELAYED_RELEASE_TABLET | Freq: Every day | ORAL | Status: DC
  Administered 2015-08-22: 81 mg via ORAL
  Filled 2015-08-21: qty 1

## 2015-08-21 MED ORDER — LISINOPRIL 5 MG PO TABS
2.5000 mg | ORAL_TABLET | Freq: Every day | ORAL | Status: DC
  Administered 2015-08-22: 2.5 mg via ORAL
  Filled 2015-08-21: qty 1

## 2015-08-21 MED ORDER — ALBUTEROL SULFATE HFA 108 (90 BASE) MCG/ACT IN AERS: 2.0000 | INHALATION_SPRAY | Freq: Three times a day (TID) | RESPIRATORY_TRACT | Status: DC | PRN

## 2015-08-21 MED ORDER — VENLAFAXINE HCL 37.5 MG PO TABS
ORAL_TABLET | ORAL | Status: AC
  Filled 2015-08-21: qty 1

## 2015-08-21 MED ORDER — ACETAMINOPHEN 325 MG PO TABS: 650.0000 mg | ORAL_TABLET | ORAL | Status: DC | PRN

## 2015-08-21 MED ORDER — CARVEDILOL 3.125 MG PO TABS: 3.1250 mg | ORAL_TABLET | Freq: Two times a day (BID) | ORAL | Status: DC

## 2015-08-21 MED ORDER — HYDROCODONE-ACETAMINOPHEN 5-325 MG PO TABS
1.0000 | ORAL_TABLET | Freq: Four times a day (QID) | ORAL | Status: DC | PRN
  Administered 2015-08-21 – 2015-08-22 (×3): 1 via ORAL
  Filled 2015-08-21 (×3): qty 1

## 2015-08-21 MED ORDER — FLUOXETINE HCL 20 MG PO CAPS
40.0000 mg | ORAL_CAPSULE | Freq: Every day | ORAL | Status: DC
  Administered 2015-08-22: 40 mg via ORAL
  Filled 2015-08-21 (×3): qty 2

## 2015-08-21 MED ORDER — LORAZEPAM 1 MG PO TABS
1.0000 mg | ORAL_TABLET | Freq: Three times a day (TID) | ORAL | Status: DC | PRN
  Administered 2015-08-21: 1 mg via ORAL
  Filled 2015-08-21: qty 1

## 2015-08-21 MED ORDER — VENLAFAXINE HCL ER 37.5 MG PO CP24
37.5000 mg | ORAL_CAPSULE | Freq: Every day | ORAL | Status: DC
  Administered 2015-08-21: 37.5 mg via ORAL
  Filled 2015-08-21 (×2): qty 1

## 2015-08-21 MED ORDER — ONDANSETRON HCL 4 MG PO TABS: 4.0000 mg | ORAL_TABLET | Freq: Three times a day (TID) | ORAL | Status: DC | PRN

## 2015-08-21 NOTE — ED Notes (Signed)
MD at bedside. 

## 2015-08-21 NOTE — ED Notes (Signed)
Shirleen Schirmer, daughter in law,  [phone]. Will provide d/c transportation.

## 2015-08-21 NOTE — ED Provider Notes (Signed)
[facility] DEPT Provider Note   CSN: [id number] Arrival date & time: 08/21/15  1554  First Provider Contact:  First MD Initiated Contact with Patient 08/21/15 1747        History   Chief Complaint Chief Complaint  Patient presents with  . V70.1    HPI [name] is a 65 y.o. female.  The history is provided by the patient and a relative.  Patient presents with worsening symptoms of depression over past 6 months She reports it is worsening and nothing improves her symptoms No fever/vomiting She has chronic back pain No cp/abd pain She reports she "can't live like this anymore" She tried to see her specialist as an outpatient but was unable  Past Medical History:  Diagnosis Date  . Anxiety   . Complication of anesthesia   . Depression   . Myocardial infarction [facility])     Patient Active Problem List   Diagnosis Date Noted  . Family hx of colon cancer 11/08/2013  . Medication management 11/08/2013  . DM2 (diabetes mellitus, type 2) ([place]) 01/28/2013  . Cardiomyopathy, ischemic- EF 40% at cath 01/24/2013  . Dyslipidemia 01/24/2013  . STEMI 01/22/13 01/22/2013  . Tobacco abuse 01/22/2013  . S/P urgent CABG x 4 01/22/13 01/22/2013    Past Surgical History:  Procedure Laterality Date  . ABDOMINAL HYSTERECTOMY    . APPENDECTOMY    . BACK SURGERY    . BREAST LUMPECTOMY    . CHOLECYSTECTOMY    . COLONOSCOPY N/A 11/18/2013   Procedure: COLONOSCOPY;  Surgeon: Rogene Houston, MD;  [facility];  Service: Endoscopy;  Laterality: N/A;  1200  . CORONARY ARTERY BYPASS GRAFT N/A 01/22/2013   Procedure: Coronary Artery Bypass Grafting Times Four Using Left Internal Mammary Artery and Right Saphenous Leg Vein Harvested Endoscopically;  Surgeon: Grace Isaac, MD;  Location: [facility];  Service: Open Heart Surgery;  Laterality: N/A;  . EXPLORATORY LAPAROTOMY    . LEFT HEART CATHETERIZATION WITH CORONARY ANGIOGRAM  01/22/2013   Procedure: LEFT HEART  CATHETERIZATION WITH CORONARY ANGIOGRAM;  Surgeon: Lorretta Harp, MD;  Location: [facility] CATH LAB;  Service: Cardiovascular;;  . ORIF ANKLE FRACTURE    . SPINAL FUSION     x 2  . TONSILLECTOMY     age 74  . TUBAL LIGATION      OB History    Gravida Para Term Preterm AB Living             3   SAB TAB Ectopic Multiple Live Births                   Home Medications    Prior to Admission medications   Medication Sig Start Date End Date Taking? Authorizing Provider  aspirin EC 81 MG tablet Take 81 mg by mouth daily.   Yes Historical Provider, MD  carvedilol (COREG) 3.125 MG tablet Take 1 tablet (3.125 mg total) by mouth 2 (two) times daily. 04/18/13  Yes Arnoldo Lenis, MD  diazepam (VALIUM) 5 MG tablet Take 5 mg by mouth 4 (four) times daily. 08/12/15  Yes Historical Provider, MD  FLUoxetine (PROZAC) 40 MG capsule Take 40 mg by mouth daily.   Yes Historical Provider, MD  Nyoka Cowden Tea, Camillia sinensis, (GREEN TEA PO) Take 1 tablet by mouth 3 (three) times daily.   Yes Historical Provider, MD  HYDROcodone-acetaminophen (NORCO) 7.5-325 MG tablet Take 1 tablet by mouth 4 (four) times daily.   Yes Historical Provider,  MD  lisinopril (PRINIVIL,ZESTRIL) 2.5 MG tablet Take 1 tablet (2.5 mg total) by mouth daily. 04/30/15  Yes Arnoldo Lenis, MD  Venlafaxine HCl 37.5 MG TB24 Take 37.5 mg by mouth at bedtime.  08/11/15  Yes Historical Provider, MD  VENTOLIN HFA 108 (90 BASE) MCG/ACT inhaler Inhale 2 puffs into the lungs 3 (three) times daily as needed. 09/27/14  Yes Historical Provider, MD    Family History Family History  Problem Relation Age of Onset  . Alcohol abuse Father   . Schizophrenia Father   . Alcohol abuse Sister   . Alcohol abuse Brother   . Heart disease      Social History Social History  Substance Use Topics  . Smoking status: Current Every Day Smoker    Packs/day: 0.50    Years: 50.00    Types: Cigarettes, E-cigarettes    Start date: 01/30/1965  . Smokeless tobacco:  Never Used     Comment: gradually cutting back. now down to half a pack daily   . Alcohol use No     Comment: 04-05-15 per pt no     Allergies   Black pepper [piper]; Other; and Tape   Review of Systems Review of Systems  Constitutional: Negative for fever.  Cardiovascular: Negative for chest pain.  Gastrointestinal: Negative for abdominal pain.  Musculoskeletal: Positive for back pain.  Psychiatric/Behavioral: Positive for dysphoric mood.  All other systems reviewed and are negative.    Physical Exam Updated Vital Signs BP 146/57   Pulse (!) 50   Temp 97.7 F (36.5 C)   Resp 18   Ht 5\' 4"  (1.626 m)   Wt 66.2 kg   SpO2 94%   BMI 25.06 kg/m   Physical Exam CONSTITUTIONAL: Well developed/well nourished HEAD: Normocephalic/atraumatic EYES: EOMI ENMT: Mucous membranes moist NECK: supple no meningeal signs SPINE/BACK:evidence of prior lumbar surgery CV: S1/S2 noted, no murmurs/rubs/gallops noted LUNGS: Lungs are clear to auscultation bilaterally, no apparent distress ABDOMEN: soft, nontender, no rebound or guarding, bowel sounds noted throughout abdomen GU:no cva tenderness NEURO: Pt is awake/alert/appropriate, moves all extremitiesx4.  No facial droop.   EXTREMITIES: pulses normal/equal, full ROM SKIN: warm, color normal PSYCH: poor eye contact, appears depressed   ED Treatments / Results  Labs (all labs ordered are listed, but only abnormal results are displayed) Labs Reviewed  COMPREHENSIVE METABOLIC PANEL - Abnormal; Notable for the following:       Result Value   Glucose, Bld 136 (*)    AST 13 (*)    ALT 10 (*)    Anion gap 4 (*)    All other components within normal limits  URINE RAPID DRUG SCREEN, HOSP PERFORMED - Abnormal; Notable for the following:    Opiates POSITIVE (*)    Benzodiazepines POSITIVE (*)    All other components within normal limits  ACETAMINOPHEN LEVEL - Abnormal; Notable for the following:    Acetaminophen (Tylenol), Serum <10 (*)     All other components within normal limits  URINALYSIS, ROUTINE W REFLEX MICROSCOPIC (NOT AT [facility]) - Abnormal; Notable for the following:    Leukocytes, UA MODERATE (*)    All other components within normal limits  URINE MICROSCOPIC-ADD ON - Abnormal; Notable for the following:    Squamous Epithelial / LPF 6-30 (*)    Bacteria, UA FEW (*)    All other components within normal limits  ETHANOL  CBC  SALICYLATE LEVEL    EKG  EKG Interpretation None  Radiology No results found.  Procedures Procedures (including critical care time)  Medications Ordered in ED Medications  LORazepam (ATIVAN) tablet 1 mg (not administered)  acetaminophen (TYLENOL) tablet 650 mg (not administered)  ondansetron (ZOFRAN) tablet 4 mg (not administered)  aspirin EC tablet 81 mg (not administered)  carvedilol (COREG) tablet 3.125 mg (not administered)  FLUoxetine (PROZAC) capsule 40 mg (not administered)  lisinopril (PRINIVIL,ZESTRIL) tablet 2.5 mg (not administered)  Venlafaxine HCl TB24 37.5 mg (not administered)  albuterol (PROVENTIL HFA;VENTOLIN HFA) 108 (90 Base) MCG/ACT inhaler 2 puff (not administered)     Initial Impression / Assessment and Plan / ED Course  I have reviewed the triage vital signs and the nursing notes.  Pertinent labs results that were available during my care of the patient were reviewed by me and considered in my medical decision making (see chart for details).  Clinical Course    Pt here with worsening depression and reports "she can't live like this anymore" Will consult psych She is medically stable Home meds ordered  Final Clinical Impressions(s) / ED Diagnoses   Final diagnoses:  Depression    New Prescriptions New Prescriptions   No medications on file     Ripley Fraise, MD 08/21/15 1926

## 2015-08-21 NOTE — ED Notes (Signed)
Patient is only ambulatory with assistance.  Patient normally walks with cane.

## 2015-08-21 NOTE — ED Notes (Signed)
Pt states she is very depressed. Denies active SI plan but states "i can't because it's against the bible but I wish God would give me another heart attack.". When asked about HI pt paused and stated "not right now but I don't know what I'll do if I have another spell". Pt states she needs her medications adjusted and can not see her psychiatrist until September.

## 2015-08-21 NOTE — ED Triage Notes (Signed)
PT and family reports over the past 6 months pt has had increased anxiety and depression with crying. PT stated she has had one psychologist appointment and doesn't get a follow up appt for medications until September. PT states she is not Suicidal but she just "wants to go home and can't live like this anymore." PT tearful in triage. PT states she is scared if she doesn't get help then she may hurt herself or someone else but not purposeful.

## 2015-08-22 ENCOUNTER — Telehealth (HOSPITAL_COMMUNITY): Payer: Self-pay | Admitting: *Deleted

## 2015-08-22 DIAGNOSIS — F332 Major depressive disorder, recurrent severe without psychotic features: Secondary | ICD-10-CM

## 2015-08-22 MED FILL — Venlafaxine HCl Cap ER 24HR 37.5 MG (Base Equivalent): ORAL | Qty: 1 | Status: AC

## 2015-08-22 NOTE — Discharge Instructions (Signed)
[facility] [address] [place], [place] [address] [id number] Therapy Appointment: 09/06/15 1pm. No sooner appointments available, however, they recommend you may call daily to check for cancellations in order to attempt accessing sooner appointment (in order to schedule medication management appointment, call [name] at number above on 08/23/15 to schedule first appointment with psychiatrist)  [facility] [address] [place], [place] [address] [phone]

## 2015-08-22 NOTE — ED Notes (Signed)
Patient still awaiting TTS consult. Patient states that she wants to go home. States that "I do not need a psychiatrist , I just need a counselor to talk to everyday. " Patient resting on stretcher with eyes closed at this time. No distress noted.

## 2015-08-22 NOTE — Telephone Encounter (Signed)
Meghan from [facility], called, said Dr. Dwyane Dee saw patient at ER and would like Dr. Harrington Challenger to see patient for one time visit, just to get her over.

## 2015-08-22 NOTE — ED Notes (Signed)
Patient awake and using restroom at this time. Patient asking for pain medication also. Patient back in bed.

## 2015-08-22 NOTE — ED Notes (Signed)
Pt given breakfast meal tray. 

## 2015-08-22 NOTE — ED Notes (Signed)
Pt given belongings. Daughter in law at bedside.
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
## 2015-08-22 NOTE — Progress Notes (Addendum)
Per TTS, provider recommended pt does not meet inpatient criteria but requested assistance in pursuing a sooner OP follow up appt than her scheduled appts in September (med management) and August (therapy). Per TTS, pt's OP provider is [facility]. [phone] CSW left voicemail requesting returned call in order to inquire about appointment availability.  [name], MSW, LCSW Clinical Social Work, Disposition  08/22/2015 [phone]  [name] called back from [facility]. Advised there are no sooner therapy appointments than pt's scheduled appt 09/06/15 1pm. States pt should call her "the day before she is free and see if there have been cancellations, if so I can move her into those slots."  Advises pt does not have a medication appointment scheduled with psychiatrist. Explains scheduler [name] is not in office today, but that pt can call tomorrow 08/23/15 and ask for [facility] and have medication appointment scheduled.  Advised psych team of above. Pt to receive repeat tele-assessment this morning, anticipate discharge.   [name], MSW, LCSW Clinical Social Work, Disposition  08/22/2015 [phone]

## 2015-08-22 NOTE — ED Notes (Signed)
Pt given lunch meal tray. Pt asking for more pain medication. Informed pt that it was ordered q6 hrs and it was last given at 0735.

## 2015-08-22 NOTE — ED Provider Notes (Signed)
Pt re-evaluated by Psych team: pt does not meet inpt criteria and can be d/c with outpt f/u. Pt d/c stable.    Francine Graven, DO 08/22/15 1451

## 2015-08-22 NOTE — ED Notes (Signed)
Pt's family arrived and is at bedside. 

## 2015-08-22 NOTE — Progress Notes (Signed)
Pt was re-evaluated by Dr. Dwyane Dee this morning and recommended to be d/c today and follow up with outpatient providers.  See writer's previous note for details of pt's OP appointments (with [facility])and instructions for accessing sooner appt (provided these instructions in d/c summary instructions section for pt to have upon d/c). At Dr. Ronnie Derby request, also contacted [facility] (pt formerly was seen there) in order to inquire if pt could be seen for one-time appt in order to prevent lapse in services while awaiting new MD appt at [facility]. [place] clinic took information and to call back with availability- will update pt when more information known.  Current known appt: [facility] [phone] [address] [place], [place] [address] 09/06/15 1pm  [name], MSW, LCSW Clinical Social Work, Disposition  08/22/2015 [phone]
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
## 2015-08-22 NOTE — Telephone Encounter (Signed)
Meghan from [facility], called, said Dr. Dwyane Dee saw patient at ER and would like Dr. Harrington Challenger to see patient for one time visit, just to get her over.

## 2015-08-22 NOTE — Telephone Encounter (Signed)
We can try but cancelled all her appts here last May

## 2015-08-23 NOTE — Telephone Encounter (Signed)
Spoke with pt and f/u appt was scheduled.

## 2015-08-27 ENCOUNTER — Encounter (HOSPITAL_COMMUNITY): Payer: Self-pay | Admitting: Psychiatry

## 2015-08-27 NOTE — Progress Notes (Signed)
  Outpatient Therapist Discharge Summary  [name]    July 05, 1950   Admission Date: 05/08/2015 Discharge Date:  08/27/2015 Reason for Discharge:  Patient decided to discontinue pursuing treatment. Diagnosis:  Axis I:  MDD, Recurrent, Severe   [name] E [place] Harmes

## 2015-09-10 ENCOUNTER — Telehealth (HOSPITAL_COMMUNITY): Payer: Self-pay | Admitting: *Deleted

## 2015-09-10 ENCOUNTER — Encounter (HOSPITAL_COMMUNITY): Payer: Self-pay | Admitting: *Deleted

## 2015-09-10 NOTE — Telephone Encounter (Signed)
Discharge pt

## 2015-09-10 NOTE — Telephone Encounter (Signed)
noted 

## 2015-09-10 NOTE — Telephone Encounter (Signed)
voice message from patient to cancel her appointment on 09/18/15.   Said she do not wish to reschedule.

## 2015-09-10 NOTE — Telephone Encounter (Signed)
Called pt due to previous message about cancelling appt. Per pt, she would like to cancel her all appts with Dr. Harrington Challenger due to her not feeling like it's helping her. Per pt, [facility] and have already been seeing them 3 times now. Asked pt if she would like to come back to Dr. Harrington Challenger so she could at least talk to Dr. Harrington Challenger about it. Per pt, no she do not want to. Asked pt if she wants Dr. Harrington Challenger to keep chart open or if she wants Dr. Harrington Challenger to close her chart out and per pt to have Dr. Harrington Challenger close her chart out due to already seeing someone at [facility] in [facility] and her PCP filling her meds as well.

## 2015-09-18 ENCOUNTER — Ambulatory Visit (HOSPITAL_COMMUNITY): Payer: Self-pay | Admitting: Psychiatry

## 2015-09-26 ENCOUNTER — Encounter (INDEPENDENT_AMBULATORY_CARE_PROVIDER_SITE_OTHER): Payer: Self-pay | Admitting: Internal Medicine

## 2015-10-03 ENCOUNTER — Ambulatory Visit (INDEPENDENT_AMBULATORY_CARE_PROVIDER_SITE_OTHER): Payer: Medicare HMO | Admitting: Internal Medicine

## 2015-10-03 ENCOUNTER — Encounter (INDEPENDENT_AMBULATORY_CARE_PROVIDER_SITE_OTHER): Payer: Self-pay | Admitting: Internal Medicine

## 2015-10-03 ENCOUNTER — Encounter (INDEPENDENT_AMBULATORY_CARE_PROVIDER_SITE_OTHER): Payer: Self-pay

## 2015-10-03 VITALS — BP 124/82 | Temp 98.4°F | Ht 64.0 in | Wt 131.4 lb

## 2015-10-03 DIAGNOSIS — R634 Abnormal weight loss: Secondary | ICD-10-CM

## 2015-10-03 DIAGNOSIS — R195 Other fecal abnormalities: Secondary | ICD-10-CM | POA: Diagnosis not present

## 2015-10-03 LAB — CBC WITH DIFFERENTIAL/PLATELET
BASOS PCT: 0 %
Basophils Absolute: 0 cells/uL (ref 0–200)
Eosinophils Absolute: 186 cells/uL (ref 15–500)
Eosinophils Relative: 2 %
HEMATOCRIT: 41.9 % (ref 35.0–45.0)
Hemoglobin: 13.8 g/dL (ref 11.7–15.5)
LYMPHS PCT: 31 %
Lymphs Abs: 2883 cells/uL (ref 850–3900)
MCH: 29.7 pg (ref 27.0–33.0)
MCHC: 32.9 g/dL (ref 32.0–36.0)
MCV: 90.3 fL (ref 80.0–100.0)
MONOS PCT: 8 %
MPV: 11 fL (ref 7.5–12.5)
Monocytes Absolute: 744 cells/uL (ref 200–950)
NEUTROS PCT: 59 %
Neutro Abs: 5487 cells/uL (ref 1500–7800)
PLATELETS: 311 10*3/uL (ref 140–400)
RBC: 4.64 MIL/uL (ref 3.80–5.10)
RDW: 14.5 % (ref 11.0–15.0)
WBC: 9.3 10*3/uL (ref 3.8–10.8)

## 2015-10-03 NOTE — Progress Notes (Signed)
Subjective:    Patient ID: [name], female    DOB: [birth year], 65 y.o.   MRN: [id number]  HPI Referred by DR .Vyas for positive stool card x 1. She tells me she has had weight loss of 39 pounds over the past month. Her weight 2 yrs ago was 132. Today her weight is 131.4. From prevous weight, looks like her highest weight was 149 which would be a 17 pound weight loss since march of this year.  She tells me she is doing okay. She does not have much of an appetite.  She tells me she was constipated last week. She usually has a BM daily. No melena or BRRB per patient. She does say sometimes she will see blood with a hemorrhoid. She denies any abdominal pain.  Takes an ASA 81mg  daily.  Hx of MI.  Family hx of colon cancer in a brother.    Weights: 09/17/2015 132 08/31/2015 135 08/24/2015 135  07/12/2015 145 04/02/2015 149 02/20/2015 144 02/16/2015 142 01/15/2015 148 01/01/2015 148 09/27/2014 141.5 08/16/2014 140.5 06/21/2014 135 05/22/2014 133 04/20/2014 135 03/24/2014 136 02/21/2014 134     11/18/2013   Colonoscopy   Indications:  Patient is 65 year old Caucasian female who is undergoing screening colonoscopy. This is patient's first exam. Her brother was diagnosed with CRC at age 49 and died of metastatic disease 2 years later. Ulcer is a cousin with surgery for CRC in his 49s. She has intermittent hematochezia felt to be secondary to hemorrhoids. Impression:  Examination performed to cecum. 3 mm polyp cold snared from sigmoid colon. External hemorrhoids. Biopsy: Tubular adenoma.  Review of Systems Past Medical History:  Diagnosis Date  . Anxiety   . Complication of anesthesia   . Depression   . Depression   . Myocardial infarction [facility])     Past Surgical History:  Procedure Laterality Date  . ABDOMINAL HYSTERECTOMY    . APPENDECTOMY    . BACK SURGERY    . BREAST LUMPECTOMY    . CHOLECYSTECTOMY    . COLONOSCOPY N/A 11/18/2013   Procedure: COLONOSCOPY;  Surgeon:  Rogene Houston, MD;  [facility];  Service: Endoscopy;  Laterality: N/A;  1200  . CORONARY ARTERY BYPASS GRAFT N/A 01/22/2013   Procedure: Coronary Artery Bypass Grafting Times Four Using Left Internal Mammary Artery and Right Saphenous Leg Vein Harvested Endoscopically;  Surgeon: Grace Isaac, MD;  Location: [place];  Service: Open Heart Surgery;  Laterality: N/A;  . EXPLORATORY LAPAROTOMY    . LEFT HEART CATHETERIZATION WITH CORONARY ANGIOGRAM  01/22/2013   Procedure: LEFT HEART CATHETERIZATION WITH CORONARY ANGIOGRAM;  Surgeon: Lorretta Harp, MD;  Location: [facility] CATH LAB;  Service: Cardiovascular;;  . ORIF ANKLE FRACTURE    . SPINAL FUSION     x 2  . TONSILLECTOMY     age 79  . TUBAL LIGATION      Allergies  Allergen Reactions  . Black Pepper [[place]] Hives, Itching and Rash  . Other Hives, Itching, Rash and Other (See Comments)    METALS  . Tape Hives, Itching and Rash    Current Outpatient Prescriptions on File Prior to Visit  Medication Sig Dispense Refill  . aspirin EC 81 MG tablet Take 81 mg by mouth daily.    . carvedilol (COREG) 3.125 MG tablet Take 1 tablet (3.125 mg total) by mouth 2 (two) times daily. 60 tablet 6  . diazepam (VALIUM) 5 MG tablet Take 5 mg by mouth 4 (four)  times daily.    [name] FLUoxetine (PROZAC) 40 MG capsule Take 40 mg by mouth daily.    [name] HYDROcodone-acetaminophen (NORCO) 7.5-325 MG tablet Take 1 tablet by mouth 4 (four) times daily.    [name] lisinopril (PRINIVIL,ZESTRIL) 2.5 MG tablet Take 1 tablet (2.5 mg total) by mouth daily. 90 tablet 3  . Venlafaxine HCl 37.5 MG TB24 Take 37.5 mg by mouth at bedtime.      No current facility-administered medications on file prior to visit.        Objective:   Physical Exam Blood pressure 124/82, temperature 98.4 F (36.9 C), height 5\' 4"  (1.626 m), weight 131 lb 6.4 oz (59.6 kg). Alert and oriented. Skin warm and dry. Oral mucosa is moist.   . Sclera anicteric, conjunctivae is pink. Thyroid not  enlarged. No cervical lymphadenopathy. Lungs clear. Heart regular rate and rhythm.  Abdomen is soft. Bowel sounds are positive. No hepatomegaly. No abdominal masses felt. No tenderness.  No edema to lower extremities.  Rectal exam: No stool and guaiac negative        Assessment & Plan:   Assessment/Plan: Guaiac positive stool. Weight loss.  3 stool cards sent home with patient.  CBC today, CMET Will repeat colonoscopy in 1 year if H and H are stable and cards are negative.  I discussed with Dr. Laural Golden. Will discuss further since there has been a weight loss.

## 2015-10-03 NOTE — Patient Instructions (Signed)
3 stools cards home with patient. CBC today.

## 2015-10-04 LAB — COMPREHENSIVE METABOLIC PANEL
ALK PHOS: 84 U/L (ref 33–130)
ALT: 7 U/L (ref 6–29)
AST: 10 U/L (ref 10–35)
Albumin: 4 g/dL (ref 3.6–5.1)
BILIRUBIN TOTAL: 0.3 mg/dL (ref 0.2–1.2)
BUN: 9 mg/dL (ref 7–25)
CALCIUM: 9.3 mg/dL (ref 8.6–10.4)
CO2: 28 mmol/L (ref 20–31)
Chloride: 104 mmol/L (ref 98–110)
Creat: 0.89 mg/dL (ref 0.50–0.99)
GLUCOSE: 143 mg/dL — AB (ref 65–99)
POTASSIUM: 4.2 mmol/L (ref 3.5–5.3)
Sodium: 139 mmol/L (ref 135–146)
Total Protein: 6.6 g/dL (ref 6.1–8.1)

## 2015-10-10 ENCOUNTER — Other Ambulatory Visit (INDEPENDENT_AMBULATORY_CARE_PROVIDER_SITE_OTHER): Payer: Self-pay | Admitting: Internal Medicine

## 2015-10-10 ENCOUNTER — Telehealth (INDEPENDENT_AMBULATORY_CARE_PROVIDER_SITE_OTHER): Payer: Self-pay | Admitting: Internal Medicine

## 2015-10-10 ENCOUNTER — Encounter (INDEPENDENT_AMBULATORY_CARE_PROVIDER_SITE_OTHER): Payer: Self-pay | Admitting: *Deleted

## 2015-10-10 ENCOUNTER — Telehealth (INDEPENDENT_AMBULATORY_CARE_PROVIDER_SITE_OTHER): Payer: Self-pay | Admitting: *Deleted

## 2015-10-10 DIAGNOSIS — R634 Abnormal weight loss: Secondary | ICD-10-CM

## 2015-10-10 DIAGNOSIS — F5000 Anorexia nervosa, unspecified: Secondary | ICD-10-CM | POA: Insufficient documentation

## 2015-10-10 NOTE — Telephone Encounter (Signed)
   Diagnosis:    Result(s)   Card 1: Positive -10/04/2015     Card 2: Positive-09-09/2015   Card 3: Negative-10/07/2015    Completed by:    HEMOCCULT SENSA DEVELOPER: [id number]   EXPIRATION DATE: 2020-05   HEMOCCULT SENSA CARD:  LOT#:[id number]   EXPIRATION DATE: 07/18  CARD CONTROL RESULTS:  POSITIVE:Positive  NEGATIVE: Negative    ADDITIONAL COMMENTS: Results forwarded to Lelon Perla

## 2015-10-10 NOTE — Telephone Encounter (Signed)
EGD w propofol sch'd 11/23/15 at 1235 (1100), preop 11/20/15 at 11, patient aware, instructions mailed

## 2015-10-10 NOTE — Telephone Encounter (Signed)
EGD with propofol 

## 2015-10-10 NOTE — Telephone Encounter (Signed)
Results given to patient.  I discussed with Dr. Laural Golden. Needs EGD.

## 2015-11-20 ENCOUNTER — Encounter (HOSPITAL_COMMUNITY)
Admission: RE | Admit: 2015-11-20 | Discharge: 2015-11-20 | Disposition: A | Discharge status: Home / Self Care | Payer: Medicare HMO | Source: Ambulatory Visit | Attending: Internal Medicine | Admitting: Internal Medicine

## 2015-11-20 ENCOUNTER — Encounter (HOSPITAL_COMMUNITY): Payer: Self-pay

## 2015-11-20 DIAGNOSIS — Z8249 Family history of ischemic heart disease and other diseases of the circulatory system: Secondary | ICD-10-CM | POA: Diagnosis not present

## 2015-11-20 DIAGNOSIS — I252 Old myocardial infarction: Secondary | ICD-10-CM | POA: Diagnosis not present

## 2015-11-20 DIAGNOSIS — R634 Abnormal weight loss: Secondary | ICD-10-CM

## 2015-11-20 DIAGNOSIS — F419 Anxiety disorder, unspecified: Secondary | ICD-10-CM | POA: Diagnosis not present

## 2015-11-20 DIAGNOSIS — Z6822 Body mass index (BMI) 22.0-22.9, adult: Secondary | ICD-10-CM | POA: Diagnosis not present

## 2015-11-20 DIAGNOSIS — Z79899 Other long term (current) drug therapy: Secondary | ICD-10-CM | POA: Diagnosis not present

## 2015-11-20 DIAGNOSIS — Z951 Presence of aortocoronary bypass graft: Secondary | ICD-10-CM | POA: Diagnosis not present

## 2015-11-20 DIAGNOSIS — Z981 Arthrodesis status: Secondary | ICD-10-CM | POA: Diagnosis not present

## 2015-11-20 DIAGNOSIS — Z811 Family history of alcohol abuse and dependence: Secondary | ICD-10-CM | POA: Diagnosis not present

## 2015-11-20 DIAGNOSIS — K921 Melena: Secondary | ICD-10-CM | POA: Diagnosis present

## 2015-11-20 DIAGNOSIS — R630 Anorexia: Secondary | ICD-10-CM | POA: Diagnosis not present

## 2015-11-20 DIAGNOSIS — F17210 Nicotine dependence, cigarettes, uncomplicated: Secondary | ICD-10-CM | POA: Diagnosis not present

## 2015-11-20 DIAGNOSIS — I10 Essential (primary) hypertension: Secondary | ICD-10-CM | POA: Diagnosis not present

## 2015-11-20 DIAGNOSIS — Z818 Family history of other mental and behavioral disorders: Secondary | ICD-10-CM | POA: Diagnosis not present

## 2015-11-20 DIAGNOSIS — G8929 Other chronic pain: Secondary | ICD-10-CM | POA: Diagnosis not present

## 2015-11-20 DIAGNOSIS — Z9102 Food additives allergy status: Secondary | ICD-10-CM | POA: Diagnosis not present

## 2015-11-20 DIAGNOSIS — K2950 Unspecified chronic gastritis without bleeding: Secondary | ICD-10-CM | POA: Diagnosis not present

## 2015-11-20 DIAGNOSIS — J449 Chronic obstructive pulmonary disease, unspecified: Secondary | ICD-10-CM | POA: Diagnosis not present

## 2015-11-20 DIAGNOSIS — Z90710 Acquired absence of both cervix and uterus: Secondary | ICD-10-CM | POA: Diagnosis not present

## 2015-11-20 DIAGNOSIS — F329 Major depressive disorder, single episode, unspecified: Secondary | ICD-10-CM | POA: Diagnosis not present

## 2015-11-20 DIAGNOSIS — Z9049 Acquired absence of other specified parts of digestive tract: Secondary | ICD-10-CM | POA: Diagnosis not present

## 2015-11-20 DIAGNOSIS — Z7982 Long term (current) use of aspirin: Secondary | ICD-10-CM | POA: Diagnosis not present

## 2015-11-20 DIAGNOSIS — M1990 Unspecified osteoarthritis, unspecified site: Secondary | ICD-10-CM | POA: Diagnosis not present

## 2015-11-20 DIAGNOSIS — Z91048 Other nonmedicinal substance allergy status: Secondary | ICD-10-CM | POA: Diagnosis not present

## 2015-11-20 DIAGNOSIS — F5000 Anorexia nervosa, unspecified: Secondary | ICD-10-CM

## 2015-11-20 DIAGNOSIS — M545 Low back pain: Secondary | ICD-10-CM | POA: Diagnosis not present

## 2015-11-20 HISTORY — DX: Other chronic pain: G89.29

## 2015-11-20 HISTORY — DX: Unspecified osteoarthritis, unspecified site: M19.90

## 2015-11-20 HISTORY — DX: Nausea with vomiting, unspecified: R11.2

## 2015-11-20 HISTORY — DX: Other specified postprocedural states: Z98.890

## 2015-11-20 HISTORY — DX: Dorsalgia, unspecified: M54.9

## 2015-11-20 HISTORY — DX: Essential (primary) hypertension: I10

## 2015-11-20 NOTE — Patient Instructions (Signed)
[name]  11/20/2015     @PREFPERIOPPHARMACY @   Your procedure is scheduled on  11/23/2015   Report to [facility] at  1105  A.M.  Call this number if you have problems the morning of surgery:  [phone]   Remember:  Do not eat food or drink liquids after midnight.  Take these medicines the morning of surgery with A SIP OF WATER  Coreg, valium, prozac, norco, lisinopril, effexor. Take your inhaler before you come.   Do not wear jewelry, make-up or nail polish.  Do not wear lotions, powders, or perfumes, or deoderant.  Do not shave 48 hours prior to surgery.  Men may shave face and neck.  Do not bring valuables to the hospital.  [facility] is not responsible for any belongings or valuables.  Contacts, dentures or bridgework may not be worn into surgery.  Leave your suitcase in the car.  After surgery it may be brought to your room.  For patients admitted to the hospital, discharge time will be determined by your treatment team.  Patients discharged the day of surgery will not be allowed to drive home.   Name and phone number of your driver:   family Special instructions:  Follow the diet instructions given to you by Dr Olevia Perches office.  Please read over the following fact sheets that you were given. Anesthesia Post-op Instructions and Care and Recovery After Surgery      Esophagogastroduodenoscopy Esophagogastroduodenoscopy (EGD) is a procedure that is used to examine the lining of the esophagus, stomach, and first part of the small intestine (duodenum). A long, flexible, lighted tube with a camera attached (endoscope) is inserted down the throat to view these organs. This procedure is done to detect problems or abnormalities, such as inflammation, bleeding, ulcers, or growths, in order to treat them. The procedure lasts 5-20 minutes. It is usually an outpatient procedure, but it may need to be performed in a hospital in emergency cases. LET [facility] CARE PROVIDER KNOW ABOUT:  Any allergies you have.  All medicines you are taking, including vitamins, herbs, eye drops, creams, and over-the-counter medicines.  Previous problems you or members of your family have had with the use of anesthetics.  Any blood disorders you have.  Previous surgeries you have had.  Medical conditions you have. RISKS AND COMPLICATIONS Generally, this is a safe procedure. However, problems can occur and include:  Infection.  Bleeding.  Tearing (perforation) of the esophagus, stomach, or duodenum.  Difficulty breathing or not being able to breathe.  Excessive sweating.  Spasms of the larynx.  Slowed heartbeat.  Low blood pressure. BEFORE THE PROCEDURE  Do not eat or drink anything after midnight on the night before the procedure or as directed by your health care provider.  Do not take your regular medicines before the procedure if your health care provider asks you not to. Ask your health care provider about changing or stopping those medicines.  If you wear dentures, be prepared to remove them before the procedure.  Arrange for someone to drive you home after the procedure. PROCEDURE  A numbing medicine (local anesthetic) may be sprayed in your throat for comfort and to stop you from gagging or coughing.  You will have an IV tube inserted in a vein in your hand or arm. You will receive medicines and fluids through this tube.  You will be given a medicine to relax you (sedative).  A  pain reliever will be given through the IV tube.  A mouth guard may be placed in your mouth to protect your teeth and to keep you from biting on the endoscope.  You will be asked to lie on your left side.  The endoscope will be inserted down your throat and into your esophagus, stomach, and duodenum.  Air will be put through the endoscope to allow your health care provider to clearly view the lining of your esophagus.  The lining of your  esophagus, stomach, and duodenum will be examined. During the exam, your health care provider may:  Remove tissue to be examined under a microscope (biopsy) for inflammation, infection, or other medical problems.  Remove growths.  Remove objects (foreign bodies) that are stuck.  Treat any bleeding with medicines or other devices that stop tissues from bleeding (hot cautery, clipping devices).  Widen (dilate) or stretch narrowed areas of your esophagus and stomach.  The endoscope will be withdrawn. AFTER THE PROCEDURE  You will be taken to a recovery area for observation. Your blood pressure, heart rate, breathing rate, and blood oxygen level will be monitored often until the medicines you were given have worn off.  Do not eat or drink anything until the numbing medicine has worn off and your gag reflex has returned. You may choke.  Your health care provider should be able to discuss his or her findings with you. It will take longer to discuss the test results if any biopsies were taken.   This information is not intended to replace advice given to you by your health care provider. Make sure you discuss any questions you have with your health care provider.   Document Released: 05/16/2004 Document Revised: 02/03/2014 Document Reviewed: 12/17/2011 Elsevier Interactive Patient Education 2016 [place]. Esophagogastroduodenoscopy, Care After Refer to this sheet in the next few weeks. These instructions provide you with information about caring for yourself after your procedure. Your health care provider may also give you more specific instructions. Your treatment has been planned according to current medical practices, but problems sometimes occur. Call your health care provider if you have any problems or questions after your procedure. WHAT TO EXPECT AFTER THE PROCEDURE After your procedure, it is typical to feel:  Soreness in your throat.  Pain with swallowing.  Sick to your  stomach (nauseous).  Bloated.  Dizzy.  Fatigued. HOME CARE INSTRUCTIONS  Do not eat or drink anything until the numbing medicine (local anesthetic) has worn off and your gag reflex has returned. You will know that the local anesthetic has worn off when you can swallow comfortably.  Do not drive or operate machinery until directed by your health care provider.  Take medicines only as directed by your health care provider. SEEK MEDICAL CARE IF:   You cannot stop coughing.  You are not urinating at all or less than usual. SEEK IMMEDIATE MEDICAL CARE IF:  You have difficulty swallowing.  You cannot eat or drink.  You have worsening throat or chest pain.  You have dizziness or lightheadedness or you faint.  You have nausea or vomiting.  You have chills.  You have a fever.  You have severe abdominal pain.  You have black, tarry, or bloody stools.   This information is not intended to replace advice given to you by your health care provider. Make sure you discuss any questions you have with your health care provider.   Document Released: 12/31/2011 Document Revised: 02/03/2014 Document Reviewed: 12/31/2011 Elsevier Interactive Patient  Education 2016 [employer] Monitored anesthesia care is an anesthesia service for a medical procedure. Anesthesia is the loss of the ability to feel pain. It is produced by medicines called anesthetics. It may affect a small area of your body (local anesthesia), a large area of your body (regional anesthesia), or your entire body (general anesthesia). The need for monitored anesthesia care depends your procedure, your condition, and the potential need for regional or general anesthesia. It is often provided during procedures where:   General anesthesia may be needed if there are complications. This is because you need special care when you are under general anesthesia.   You will be under local or regional  anesthesia. This is so that you are able to have higher levels of anesthesia if needed.   You will receive calming medicines (sedatives). This is especially the case if sedatives are given to put you in a semi-conscious state of relaxation (deep sedation). This is because the amount of sedative needed to produce this state can be hard to predict. Too much of a sedative can produce general anesthesia. Monitored anesthesia care is performed by one or more health care providers who have special training in all types of anesthesia. You will need to meet with these health care providers before your procedure. During this meeting, they will ask you about your medical history. They will also give you instructions to follow. (For example, you will need to stop eating and drinking before your procedure. You may also need to stop or change medicines you are taking.) During your procedure, your health care providers will stay with you. They will:   Watch your condition. This includes watching your blood pressure, breathing, and level of pain.   Diagnose and treat problems that occur.   Give medicines if they are needed. These may include calming medicines (sedatives) and anesthetics.   Make sure you are comfortable.  Having monitored anesthesia care does not necessarily mean that you will be under anesthesia. It does mean that your health care providers will be able to manage anesthesia if you need it or if it occurs. It also means that you will be able to have a different type of anesthesia than you are having if you need it. When your procedure is complete, your health care providers will continue to watch your condition. They will make sure any medicines wear off before you are allowed to go home.    This information is not intended to replace advice given to you by your health care provider. Make sure you discuss any questions you have with your health care provider.   Document Released: 10/09/2004  Document Revised: 02/03/2014 Document Reviewed: 02/25/2012 Elsevier Interactive Patient Education 2016 Elsevier Inc. PATIENT INSTRUCTIONS POST-ANESTHESIA  IMMEDIATELY FOLLOWING SURGERY:  Do not drive or operate machinery for the first twenty four hours after surgery.  Do not make any important decisions for twenty four hours after surgery or while taking narcotic pain medications or sedatives.  If you develop intractable nausea and vomiting or a severe headache please notify your doctor immediately.  FOLLOW-UP:  Please make an appointment with your surgeon as instructed. You do not need to follow up with anesthesia unless specifically instructed to do so.  WOUND CARE INSTRUCTIONS (if applicable):  Keep a dry clean dressing on the anesthesia/puncture wound site if there is drainage.  Once the wound has quit draining you may leave it open to air.  Generally you should leave the bandage intact  for twenty four hours unless there is drainage.  If the epidural site drains for more than 36-48 hours please call the anesthesia department.  QUESTIONS?:  Please feel free to call your physician or the hospital operator if you have any questions, and they will be happy to assist you.

## 2015-11-23 ENCOUNTER — Encounter (HOSPITAL_COMMUNITY)
Admission: RE | Disposition: A | Discharge status: Home / Self Care | Payer: Self-pay | Source: Ambulatory Visit | Attending: Internal Medicine

## 2015-11-23 ENCOUNTER — Ambulatory Visit (HOSPITAL_COMMUNITY): Payer: Medicare HMO | Admitting: Anesthesiology

## 2015-11-23 ENCOUNTER — Encounter (HOSPITAL_COMMUNITY): Payer: Self-pay

## 2015-11-23 ENCOUNTER — Ambulatory Visit (HOSPITAL_COMMUNITY)
Admission: RE | Admit: 2015-11-23 | Discharge: 2015-11-23 | Disposition: A | Discharge status: Home / Self Care | Payer: Medicare HMO | Source: Ambulatory Visit | Attending: Internal Medicine | Admitting: Internal Medicine

## 2015-11-23 DIAGNOSIS — Z811 Family history of alcohol abuse and dependence: Secondary | ICD-10-CM | POA: Insufficient documentation

## 2015-11-23 DIAGNOSIS — F17210 Nicotine dependence, cigarettes, uncomplicated: Secondary | ICD-10-CM | POA: Insufficient documentation

## 2015-11-23 DIAGNOSIS — G8929 Other chronic pain: Secondary | ICD-10-CM | POA: Insufficient documentation

## 2015-11-23 DIAGNOSIS — F419 Anxiety disorder, unspecified: Secondary | ICD-10-CM | POA: Diagnosis not present

## 2015-11-23 DIAGNOSIS — Z6822 Body mass index (BMI) 22.0-22.9, adult: Secondary | ICD-10-CM | POA: Insufficient documentation

## 2015-11-23 DIAGNOSIS — K2970 Gastritis, unspecified, without bleeding: Secondary | ICD-10-CM | POA: Diagnosis not present

## 2015-11-23 DIAGNOSIS — F5000 Anorexia nervosa, unspecified: Secondary | ICD-10-CM | POA: Insufficient documentation

## 2015-11-23 DIAGNOSIS — M1990 Unspecified osteoarthritis, unspecified site: Secondary | ICD-10-CM | POA: Insufficient documentation

## 2015-11-23 DIAGNOSIS — Z7982 Long term (current) use of aspirin: Secondary | ICD-10-CM | POA: Insufficient documentation

## 2015-11-23 DIAGNOSIS — Z9102 Food additives allergy status: Secondary | ICD-10-CM | POA: Insufficient documentation

## 2015-11-23 DIAGNOSIS — Z90710 Acquired absence of both cervix and uterus: Secondary | ICD-10-CM | POA: Insufficient documentation

## 2015-11-23 DIAGNOSIS — K2950 Unspecified chronic gastritis without bleeding: Secondary | ICD-10-CM | POA: Insufficient documentation

## 2015-11-23 DIAGNOSIS — Z91048 Other nonmedicinal substance allergy status: Secondary | ICD-10-CM | POA: Insufficient documentation

## 2015-11-23 DIAGNOSIS — M545 Low back pain: Secondary | ICD-10-CM | POA: Diagnosis not present

## 2015-11-23 DIAGNOSIS — I10 Essential (primary) hypertension: Secondary | ICD-10-CM | POA: Insufficient documentation

## 2015-11-23 DIAGNOSIS — J449 Chronic obstructive pulmonary disease, unspecified: Secondary | ICD-10-CM | POA: Insufficient documentation

## 2015-11-23 DIAGNOSIS — R195 Other fecal abnormalities: Secondary | ICD-10-CM | POA: Diagnosis not present

## 2015-11-23 DIAGNOSIS — R634 Abnormal weight loss: Secondary | ICD-10-CM | POA: Insufficient documentation

## 2015-11-23 DIAGNOSIS — F329 Major depressive disorder, single episode, unspecified: Secondary | ICD-10-CM | POA: Insufficient documentation

## 2015-11-23 DIAGNOSIS — Z981 Arthrodesis status: Secondary | ICD-10-CM | POA: Insufficient documentation

## 2015-11-23 DIAGNOSIS — K228 Other specified diseases of esophagus: Secondary | ICD-10-CM | POA: Diagnosis not present

## 2015-11-23 DIAGNOSIS — Z818 Family history of other mental and behavioral disorders: Secondary | ICD-10-CM | POA: Insufficient documentation

## 2015-11-23 DIAGNOSIS — Z8249 Family history of ischemic heart disease and other diseases of the circulatory system: Secondary | ICD-10-CM | POA: Insufficient documentation

## 2015-11-23 DIAGNOSIS — Z9049 Acquired absence of other specified parts of digestive tract: Secondary | ICD-10-CM | POA: Insufficient documentation

## 2015-11-23 DIAGNOSIS — Z951 Presence of aortocoronary bypass graft: Secondary | ICD-10-CM | POA: Insufficient documentation

## 2015-11-23 DIAGNOSIS — R630 Anorexia: Secondary | ICD-10-CM | POA: Insufficient documentation

## 2015-11-23 DIAGNOSIS — Z79899 Other long term (current) drug therapy: Secondary | ICD-10-CM | POA: Insufficient documentation

## 2015-11-23 DIAGNOSIS — I252 Old myocardial infarction: Secondary | ICD-10-CM | POA: Insufficient documentation

## 2015-11-23 HISTORY — PX: ESOPHAGOGASTRODUODENOSCOPY (EGD) WITH PROPOFOL: SHX5813

## 2015-11-23 LAB — GLUCOSE, CAPILLARY: GLUCOSE-CAPILLARY: 134 mg/dL — AB (ref 65–99)

## 2015-11-23 SURGERY — ESOPHAGOGASTRODUODENOSCOPY (EGD) WITH PROPOFOL
Anesthesia: Monitor Anesthesia Care

## 2015-11-23 MED ORDER — LIDOCAINE VISCOUS 2 % MT SOLN
15.0000 mL | Freq: Once | OROMUCOSAL | Status: AC
  Administered 2015-11-23: 15 mL via OROMUCOSAL

## 2015-11-23 MED ORDER — FENTANYL CITRATE (PF) 100 MCG/2ML IJ SOLN
INTRAMUSCULAR | Status: AC
  Filled 2015-11-23: qty 2

## 2015-11-23 MED ORDER — LIDOCAINE HCL (CARDIAC) 10 MG/ML IV SOLN
INTRAVENOUS | Status: DC | PRN
  Administered 2015-11-23: 50 mg via INTRAVENOUS

## 2015-11-23 MED ORDER — PROPOFOL 500 MG/50ML IV EMUL
INTRAVENOUS | Status: DC | PRN
  Administered 2015-11-23: 75 ug/kg/min via INTRAVENOUS

## 2015-11-23 MED ORDER — CHLORHEXIDINE GLUCONATE CLOTH 2 % EX PADS: 6.0000 | MEDICATED_PAD | Freq: Once | CUTANEOUS | Status: DC

## 2015-11-23 MED ORDER — ONDANSETRON HCL 4 MG/2ML IJ SOLN
INTRAMUSCULAR | Status: AC
  Filled 2015-11-23: qty 2

## 2015-11-23 MED ORDER — FENTANYL CITRATE (PF) 100 MCG/2ML IJ SOLN
25.0000 ug | INTRAMUSCULAR | Status: AC | PRN
  Administered 2015-11-23 (×2): 25 ug via INTRAVENOUS

## 2015-11-23 MED ORDER — GLYCOPYRROLATE 0.2 MG/ML IJ SOLN
INTRAMUSCULAR | Status: AC
  Filled 2015-11-23: qty 1

## 2015-11-23 MED ORDER — MIDAZOLAM HCL 2 MG/2ML IJ SOLN
INTRAMUSCULAR | Status: AC
  Filled 2015-11-23: qty 2

## 2015-11-23 MED ORDER — LIDOCAINE VISCOUS 2 % MT SOLN
OROMUCOSAL | Status: AC
  Filled 2015-11-23: qty 15

## 2015-11-23 MED ORDER — ONDANSETRON HCL 4 MG/2ML IJ SOLN
4.0000 mg | Freq: Once | INTRAMUSCULAR | Status: AC
  Administered 2015-11-23: 4 mg via INTRAVENOUS

## 2015-11-23 MED ORDER — MIDAZOLAM HCL 2 MG/2ML IJ SOLN
1.0000 mg | INTRAMUSCULAR | Status: DC | PRN
  Administered 2015-11-23: 2 mg via INTRAVENOUS

## 2015-11-23 MED ORDER — GLYCOPYRROLATE 0.2 MG/ML IJ SOLN
0.2000 mg | Freq: Once | INTRAMUSCULAR | Status: AC | PRN
  Administered 2015-11-23: 0.2 mg via INTRAVENOUS

## 2015-11-23 MED ORDER — PROPOFOL 10 MG/ML IV BOLUS
INTRAVENOUS | Status: DC | PRN
  Administered 2015-11-23 (×2): 10 mg via INTRAVENOUS

## 2015-11-23 MED ORDER — LACTATED RINGERS IV SOLN
INTRAVENOUS | Status: DC
  Administered 2015-11-23: 12:00:00 via INTRAVENOUS

## 2015-11-23 NOTE — Discharge Instructions (Signed)
Resume aspirin on 11/24/2015. Resume other medications and diet as before. No driving for 24 hours. Physician will call with biopsy results. Weight check in one month.    Esophagogastroduodenoscopy, Care After Refer to this sheet in the next few weeks. These instructions provide you with information about caring for yourself after your procedure. Your health care provider may also give you more specific instructions. Your treatment has been planned according to current medical practices, but problems sometimes occur. Call your health care provider if you have any problems or questions after your procedure. WHAT TO EXPECT AFTER THE PROCEDURE After your procedure, it is typical to feel:  Soreness in your throat.  Pain with swallowing.  Sick to your stomach (nauseous).  Bloated.  Dizzy.  Fatigued. HOME CARE INSTRUCTIONS  Do not eat or drink anything until the numbing medicine (local anesthetic) has worn off and your gag reflex has returned. You will know that the local anesthetic has worn off when you can swallow comfortably.  Do not drive or operate machinery until directed by your health care provider.  Take medicines only as directed by your health care provider. SEEK MEDICAL CARE IF:   You cannot stop coughing.  You are not urinating at all or less than usual. SEEK IMMEDIATE MEDICAL CARE IF:  You have difficulty swallowing.  You cannot eat or drink.  You have worsening throat or chest pain.  You have dizziness or lightheadedness or you faint.  You have nausea or vomiting.  You have chills.  You have a fever.  You have severe abdominal pain.  You have black, tarry, or bloody stools.   This information is not intended to replace advice given to you by your health care provider. Make sure you discuss any questions you have with your health care provider.   Document Released: 12/31/2011 Document Revised: 02/03/2014 Document Reviewed: 12/31/2011 Elsevier  Interactive Patient Education [employer].

## 2015-11-23 NOTE — Anesthesia Postprocedure Evaluation (Signed)
Anesthesia Post Note  Patient: [name]  Procedure(s) Performed: Procedure(s) (LRB): ESOPHAGOGASTRODUODENOSCOPY (EGD) WITH PROPOFOL (N/A)  Patient location during evaluation: PACU Anesthesia Type: MAC Level of consciousness: awake and alert Pain management: pain level controlled Vital Signs Assessment: post-procedure vital signs reviewed and stable Respiratory status: spontaneous breathing Cardiovascular status: stable Anesthetic complications: no    Last Vitals:  Vitals:   11/23/15 1250 11/23/15 1324  BP: 138/67 (!) 116/52  Resp: 14 (P) 18  Temp:  (P) 36.4 C    Last Pain:  Vitals:   11/23/15 1215  TempSrc:   PainSc: 0-No pain                 Severn Goddard

## 2015-11-23 NOTE — Op Note (Signed)
[facility] Patient Name: [name] Procedure Date: 11/23/2015 1:02 PM MRN: [id number] Date of Birth: [birth year] Attending MD: Hildred Laser , MD CSN: [id number] Age: 65 Admit Type: Outpatient Procedure:                Upper GI endoscopy Indications:              Heme positive stool, Anorexia, Weight loss Providers:                Hildred Laser, MD, Lurline Del, RN, Charlyne Petrin                            RN, RN, Purcell Nails. [place], Technician Referring MD:             Glenda Chroman, MD Medicines:                Cetacaine spray, Propofol per Anesthesia Complications:            No immediate complications. Estimated Blood Loss:     Estimated blood loss was minimal. Procedure:                Pre-Anesthesia Assessment:                           - Prior to the procedure, a History and Physical                            was performed, and patient medications and                            allergies were reviewed. The patient's tolerance of                            previous anesthesia was also reviewed. The risks                            and benefits of the procedure and the sedation                            options and risks were discussed with the patient.                            All questions were answered, and informed consent                            was obtained. Prior Anticoagulants: The patient                            last took aspirin 2 days prior to the procedure.                            ASA Grade Assessment: III - A patient with severe                            systemic disease. After reviewing the risks and  benefits, the patient was deemed in satisfactory                            condition to undergo the procedure.                           After obtaining informed consent, the endoscope was                            passed under direct vision. Throughout the                            procedure, the patient's blood  pressure, pulse, and                            oxygen saturations were monitored continuously. The                            [id number]) was introduced through the                            mouth, and advanced to the second part of duodenum.                            The upper GI endoscopy was accomplished without                            difficulty. The patient tolerated the procedure                            well. Scope In: 1:09:19 PM Scope Out: 1:16:28 PM Total Procedure Duration: 0 hours 7 minutes 9 seconds  Findings:      The examined esophagus was normal.      The Z-line was irregular and was found 37 cm from the incisors.      Patchy mild inflammation characterized by congestion (edema), erythema       and granularity was found in the gastric antrum. Biopsies were taken       with a cold forceps for histology.      The exam of the stomach was otherwise normal.      The duodenal bulb and second portion of the duodenum were normal. Impression:               - Normal esophagus.                           - Z-line irregular, 37 cm from the incisors.                           - Gastritis. Biopsied.                           - Normal duodenal bulb and second portion of the                            duodenum. Moderate Sedation:  Per Anesthesia Care Recommendation:           - Patient has a contact number available for                            emergencies. The signs and symptoms of potential                            delayed complications were discussed with the                            patient. Return to normal activities tomorrow.                            Written discharge instructions were provided to the                            patient.                           - Resume previous diet today.                           - Continue present medications.                           - Resume aspirin at prior dose tomorrow.                           - Await  pathology results.                           - weight check in one month. Procedure Code(s):        --- Professional ---                           [phone], Esophagogastroduodenoscopy, flexible,                            transoral; with biopsy, single or multiple Diagnosis Code(s):        --- Professional ---                           K22.8, Other specified diseases of esophagus                           K29.70, Gastritis, unspecified, without bleeding                           R19.5, Other fecal abnormalities                           R63.0, Anorexia                           R63.4, Abnormal weight loss CPT copyright 2016 American Medical Association. All rights reserved. The codes documented in this report are preliminary and upon coder review may  be revised to meet current compliance requirements. Hildred Laser, MD Hildred Laser, MD 11/23/2015 1:23:28 PM This report has been signed electronically. Number of Addenda: 0

## 2015-11-23 NOTE — H&P (Signed)
[name] is an 65 y.o. female.   Chief Complaint: Patient is here for EGD. HPI: Patient is 65 year old Caucasian female with multiple medical problems who presents with several week history of anorexia and weight loss. According to her daughter she has lost 39 pounds. She denies nausea vomiting heartburn dysphagia. She also denies abdominal pain melena or rectal bleeding. Her stool was noted to be in positive. She is up-to-date for high risk screening colonoscopy. Last exam was 2 years ago. He is on low-dose aspirin but does not take any other OTC NSAIDs. She had CBC and comprehensive chemistry panel once 10/03/2015 and these are within normal limits except glucose of 143. Her sister who is at bedside states that she has had emotional problems. She did see counselor and decided not to go back.  Past Medical History:  Diagnosis Date  . Anxiety   . Arthritis   . Chronic back pain           . Depression   . Hypertension   . Myocardial infarction    x2.  [name] PONV (postoperative nausea and vomiting)     Past Surgical History:  Procedure Laterality Date  . ABDOMINAL HYSTERECTOMY    . APPENDECTOMY    . BACK SURGERY     x3; fusion with pins and rods  . BREAST LUMPECTOMY Left   . CHOLECYSTECTOMY    . COLONOSCOPY N/A 11/18/2013   Procedure: COLONOSCOPY;  Surgeon: Rogene Houston, MD;  Location: [facility];  Service: Endoscopy;  Laterality: N/A;  1200  . CORONARY ARTERY BYPASS GRAFT N/A 01/22/2013   Procedure: Coronary Artery Bypass Grafting Times Four Using Left Internal Mammary Artery and Right Saphenous Leg Vein Harvested Endoscopically;  Surgeon: Grace Isaac, MD;  Location: [place];  Service: Open Heart Surgery;  Laterality: N/A;  . EXPLORATORY LAPAROTOMY    . LEFT HEART CATHETERIZATION WITH CORONARY ANGIOGRAM  01/22/2013   Procedure: LEFT HEART CATHETERIZATION WITH CORONARY ANGIOGRAM;  Surgeon: Lorretta Harp, MD;  Location: [facility] CATH LAB;  Service: Cardiovascular;;  . ORIF  ANKLE FRACTURE Left   . SPINAL FUSION     x 2  . TONSILLECTOMY     age 52  . TUBAL LIGATION      Family History  Problem Relation Age of Onset  . Alcohol abuse Father   . Schizophrenia Father   . Alcohol abuse Sister   . Alcohol abuse Brother   . Heart disease     Social History:  reports that she has been smoking Cigarettes and E-cigarettes.  She started smoking about 50 years ago. She has a 50.00 pack-year smoking history. She has never used smokeless tobacco. She reports that she does not drink alcohol or use drugs.  Allergies:  Allergies  Allergen Reactions  . Black Pepper [[place]] Hives, Itching and Rash  . Other Hives, Itching, Rash and Other (See Comments)    METALS  . Tape Hives, Itching and Rash    Medications Prior to Admission  Medication Sig Dispense Refill  . albuterol (PROVENTIL HFA;VENTOLIN HFA) 108 (90 Base) MCG/ACT inhaler Inhale 2 puffs into the lungs every 6 (six) hours as needed for wheezing or shortness of breath.    [name] aspirin EC 81 MG tablet Take 81 mg by mouth daily.    . carvedilol (COREG) 3.125 MG tablet Take 1 tablet (3.125 mg total) by mouth 2 (two) times daily. 60 tablet 6  . diazepam (VALIUM) 5 MG tablet Take 5 mg by mouth  every 6 (six) hours as needed for anxiety.     [name] FLUoxetine (PROZAC) 40 MG capsule Take 40 mg by mouth daily.    [name] HYDROcodone-acetaminophen (NORCO) 7.5-325 MG tablet Take 1 tablet by mouth 4 (four) times daily.    [name] lisinopril (PRINIVIL,ZESTRIL) 2.5 MG tablet Take 1 tablet (2.5 mg total) by mouth daily. 90 tablet 3  . venlafaxine XR (EFFEXOR-XR) 75 MG 24 hr capsule Take 75 mg by mouth at bedtime.       Results for orders placed or performed during the hospital encounter of 11/23/15 (from the past 48 hour(s))  Glucose, capillary     Status: Abnormal   Collection Time: 11/23/15 11:45 AM  Result Value Ref Range   Glucose-Capillary 134 (H) 65 - 99 mg/dL   No results found.  ROS  Blood pressure 138/67, temperature 98.2 F  (36.8 C), temperature source Oral, resp. rate 14, height 5\' 4"  (1.626 m), weight 131 lb (59.4 kg), SpO2 95 %. Physical Exam  Constitutional: She appears well-developed and well-nourished.  HENT:  Mouth/Throat: Oropharynx is clear and moist.  Eyes: Conjunctivae are normal. No scleral icterus.  Neck: No thyromegaly present.  Cardiovascular: Normal rate, regular rhythm and normal heart sounds.   No murmur heard. Respiratory: Effort normal and breath sounds normal.  GI: Soft. She exhibits no distension and no mass. There is no tenderness.  Musculoskeletal: She exhibits no edema.  Lymphadenopathy:    She has no cervical adenopathy.  Neurological: She is alert.  Skin: Skin is warm and dry.     Assessment/Plan Anorexia weight loss and heme positive stool. Last colonoscopy was in October 2015. Normal CBC and comprehensive chemistry panel. Diagnostic EGD under monitored anesthesia care.  Hildred Laser, MD 11/23/2015, 12:53 PM

## 2015-11-23 NOTE — Transfer of Care (Signed)
Immediate Anesthesia Transfer of Care Note  Patient: [name]  Procedure(s) Performed: Procedure(s) with comments: ESOPHAGOGASTRODUODENOSCOPY (EGD) WITH PROPOFOL (N/A) - 12:35  Patient Location: PACU  Anesthesia Type:MAC  Level of Consciousness: awake  Airway & Oxygen Therapy: Patient Spontanous Breathing and non-rebreather face mask  Post-op Assessment: Report given to RN and Post -op Vital signs reviewed and stable  Post vital signs: Reviewed  Last Vitals:  Vitals:   11/23/15 1245 11/23/15 1250  BP: 133/68 138/67  Resp: (!) 23 14  Temp:      Last Pain:  Vitals:   11/23/15 1215  TempSrc:   PainSc: 0-No pain         Complications: No apparent anesthesia complications

## 2015-11-23 NOTE — Anesthesia Preprocedure Evaluation (Signed)
Anesthesia Evaluation   Patient awake    Reviewed: Allergy & Precautions, H&P , NPO status , Patient's Chart, lab work & pertinent test results, reviewed documented beta blocker date and time   History of Anesthesia Complications (+) PONV and history of anesthetic complications  Airway Mallampati: II  TM Distance: >3 FB     Dental  (+) Edentulous Upper, Edentulous Lower   Pulmonary COPD, Current Smoker ( chronic cough),    breath sounds clear to auscultation       Cardiovascular hypertension, Pt. on medications and Pt. on home beta blockers + Past MI   Rhythm:Regular Rate:Normal     Neuro/Psych PSYCHIATRIC DISORDERS Anxiety Depression    GI/Hepatic   Endo/Other  diabetes, Type 2  Renal/GU      Musculoskeletal  (+) Arthritis  (chronic LBP),   Abdominal   Peds  Hematology   Anesthesia Other Findings   Reproductive/Obstetrics                             Anesthesia Physical Anesthesia Plan  ASA: III  Anesthesia Plan:    Post-op Pain Management:    Induction: Intravenous  Airway Management Planned: Simple Face Mask  Additional Equipment:   Intra-op Plan:   Post-operative Plan:   Informed Consent: I have reviewed the patients History and Physical, chart, labs and discussed the procedure including the risks, benefits and alternatives for the proposed anesthesia with the patient or authorized representative who has indicated his/her understanding and acceptance.     Plan Discussed with:   Anesthesia Plan Comments:         Anesthesia Quick Evaluation

## 2015-11-23 NOTE — Anesthesia Procedure Notes (Signed)
Procedure Name: MAC Date/Time: 11/23/2015 12:57 PM Performed by: Vista Deck Pre-anesthesia Checklist: Patient identified, Emergency Drugs available, Suction available, Timeout performed and Patient being monitored Patient Re-evaluated:Patient Re-evaluated prior to inductionOxygen Delivery Method: Non-rebreather mask

## 2015-11-28 ENCOUNTER — Encounter (HOSPITAL_COMMUNITY): Payer: Self-pay | Admitting: Internal Medicine

## 2015-12-03 ENCOUNTER — Encounter (INDEPENDENT_AMBULATORY_CARE_PROVIDER_SITE_OTHER): Payer: Self-pay | Admitting: Internal Medicine

## 2015-12-03 NOTE — Progress Notes (Signed)
Patient was given an appointment with Deberah Castle, NP for 01/09/16 at 10:30am.  A letter was mailed to the patient.

## 2015-12-25 ENCOUNTER — Encounter: Payer: Self-pay | Admitting: *Deleted

## 2015-12-26 ENCOUNTER — Encounter: Payer: Self-pay | Admitting: Cardiology

## 2015-12-26 ENCOUNTER — Ambulatory Visit (INDEPENDENT_AMBULATORY_CARE_PROVIDER_SITE_OTHER): Payer: Medicare HMO | Admitting: Cardiology

## 2015-12-26 ENCOUNTER — Encounter: Payer: Self-pay | Admitting: *Deleted

## 2015-12-26 VITALS — BP 91/54 | HR 61 | Ht 64.0 in | Wt 127.0 lb

## 2015-12-26 DIAGNOSIS — I6523 Occlusion and stenosis of bilateral carotid arteries: Secondary | ICD-10-CM

## 2015-12-26 DIAGNOSIS — E782 Mixed hyperlipidemia: Secondary | ICD-10-CM

## 2015-12-26 DIAGNOSIS — I2510 Atherosclerotic heart disease of native coronary artery without angina pectoris: Secondary | ICD-10-CM

## 2015-12-26 DIAGNOSIS — I10 Essential (primary) hypertension: Secondary | ICD-10-CM | POA: Diagnosis not present

## 2015-12-26 MED ORDER — ATORVASTATIN CALCIUM 80 MG PO TABS: 80.0000 mg | ORAL_TABLET | Freq: Every day | ORAL | 3 refills | Status: DC

## 2015-12-26 NOTE — Progress Notes (Signed)
Clinical Summary [name] is a 65 y.o.female seen today for follow up of the following medical problems.   1. CAD  - prior STEMI 12/2012, found to have severe multivessel disease on cath. Had emergent CABG 4 vessel (LIMA-LAD, SVG to ramus, SVG to LCX, SVG to RCA. LVEF 40% by LV gram  - repeat echo 04/2013 showed normalized LVEF, 55-60% - February episodes of chest pain. Pressure/tightness like feeling around entire chest, 2/10 initially and episodes progressed to 8/10 in seviety over time. Occur at rest or with exertion. Can be worst with position. No relation to food. Can last up to 5-6 hours constant without relief. - since [place] however she has not had any recurrent symptoms.    - no recent chest pain. Recent SOB. +coughing, wheezing.  - compliant with meds  2. Hyperlipidemia  10/2014: TC 329 TG 280 HDL 41 LDL 232 - Prior side effects on crestor, tolerating atorvastatin well.  -poor dietary habits, eats a lot of sweets - had been on atorvastatin, unclear why no longer taking. She thinks ran out of refills.   3. HTN - she does not check regularly at home -she is compliant with meds.   4. COPD - PFTs 10/2014 with severe COPD - she has had difficultly affording her inhalers - recent cough, wheezing, SOB   5. Carotid stenosis - [place] 10/2014 with bilateral ICA 1-39% disease.  - no recent neuro symptoms since last visit.   6. Blood in stool - followed by GI.     Past Medical History:  Diagnosis Date  . Anxiety   . Arthritis   . Chronic back pain   . Complication of anesthesia   . Depression   . Depression   . Hypertension   . Myocardial infarction    x2.  [name] PONV (postoperative nausea and vomiting)      Allergies  Allergen Reactions  . Black Pepper [Piper] Hives, Itching and Rash  . Other Hives, Itching, Rash and Other (See Comments)    METALS  . Tape Hives, Itching and Rash     Current Outpatient Prescriptions  Medication Sig  Dispense Refill  . albuterol (PROVENTIL HFA;VENTOLIN HFA) 108 (90 Base) MCG/ACT inhaler Inhale 2 puffs into the lungs every 6 (six) hours as needed for wheezing or shortness of breath.    [name] aspirin EC 81 MG tablet Take 81 mg by mouth daily.    . carvedilol (COREG) 3.125 MG tablet Take 1 tablet (3.125 mg total) by mouth 2 (two) times daily. 60 tablet 6  . diazepam (VALIUM) 5 MG tablet Take 5 mg by mouth every 6 (six) hours as needed for anxiety.     [name] FLUoxetine (PROZAC) 40 MG capsule Take 40 mg by mouth daily.    [name] HYDROcodone-acetaminophen (NORCO) 7.5-325 MG tablet Take 1 tablet by mouth 4 (four) times daily.    [name] lisinopril (PRINIVIL,ZESTRIL) 2.5 MG tablet Take 1 tablet (2.5 mg total) by mouth daily. 90 tablet 3  . venlafaxine XR (EFFEXOR-XR) 75 MG 24 hr capsule Take 75 mg by mouth at bedtime.      No current facility-administered medications for this visit.      Past Surgical History:  Procedure Laterality Date  . ABDOMINAL HYSTERECTOMY    . APPENDECTOMY    . BACK SURGERY     x3; fusion with pins and rods  . BREAST LUMPECTOMY Left   . CHOLECYSTECTOMY    . COLONOSCOPY N/A 11/18/2013   Procedure: COLONOSCOPY;  Surgeon: Rogene Houston, MD;  [facility];  Service: Endoscopy;  Laterality: N/A;  1200  . CORONARY ARTERY BYPASS GRAFT N/A 01/22/2013   Procedure: Coronary Artery Bypass Grafting Times Four Using Left Internal Mammary Artery and Right Saphenous Leg Vein Harvested Endoscopically;  Surgeon: Grace Isaac, MD;  Location: [place];  Service: Open Heart Surgery;  Laterality: N/A;  . ESOPHAGOGASTRODUODENOSCOPY (EGD) WITH PROPOFOL N/A 11/23/2015   Procedure: ESOPHAGOGASTRODUODENOSCOPY (EGD) WITH PROPOFOL;  Surgeon: Rogene Houston, MD;  [facility];  Service: Endoscopy;  Laterality: N/A;  12:35  . EXPLORATORY LAPAROTOMY    . LEFT HEART CATHETERIZATION WITH CORONARY ANGIOGRAM  01/22/2013   Procedure: LEFT HEART CATHETERIZATION WITH CORONARY ANGIOGRAM;  Surgeon:  Lorretta Harp, MD;  Location: [facility] CATH LAB;  Service: Cardiovascular;;  . ORIF ANKLE FRACTURE Left   . SPINAL FUSION     x 2  . TONSILLECTOMY     age 80  . TUBAL LIGATION       Allergies  Allergen Reactions  . Black Pepper [Piper] Hives, Itching and Rash  . Other Hives, Itching, Rash and Other (See Comments)    METALS  . Tape Hives, Itching and Rash      Family History  Problem Relation Age of Onset  . Alcohol abuse Father   . Schizophrenia Father   . Alcohol abuse Sister   . Alcohol abuse Brother   . Heart disease       Social History [name] reports that she has been smoking Cigarettes and E-cigarettes.  She started smoking about 50 years ago. She has a 50.00 pack-year smoking history. She has never used smokeless tobacco. [name] reports that she does not drink alcohol.   Review of Systems CONSTITUTIONAL: No weight loss, fever, chills, weakness or fatigue.  HEENT: Eyes: No visual loss, blurred vision, double vision or yellow sclerae.No hearing loss, sneezing, congestion, runny nose or sore throat.  SKIN: No rash or itching.  CARDIOVASCULAR: per HPI RESPIRATORY: No shortness of breath, cough or sputum.  GASTROINTESTINAL: No anorexia, nausea, vomiting or diarrhea. No abdominal pain or blood.  GENITOURINARY: No burning on urination, no polyuria NEUROLOGICAL: No headache, dizziness, syncope, paralysis, ataxia, numbness or tingling in the extremities. No change in bowel or bladder control.  MUSCULOSKELETAL: No muscle, back pain, joint pain or stiffness.  LYMPHATICS: No enlarged nodes. No history of splenectomy.  PSYCHIATRIC: No history of depression or anxiety.  ENDOCRINOLOGIC: No reports of sweating, cold or heat intolerance. No polyuria or polydipsia.  [name]   Physical Examination Vitals:   12/26/15 1419  BP: (!) 91/54  Pulse: 61   Vitals:   12/26/15 1419  Weight: 127 lb (57.6 kg)  Height: 5\' 4"  (1.626 m)    Gen: resting comfortably, no acute  distress HEENT: no scleral icterus, pupils equal round and reactive, no palptable cervical adenopathy,  CV: RRR, no m/r/g, no jvd Resp: Clear to auscultation bilaterally GI: abdomen is soft, non-tender, non-distended, normal bowel sounds, no hepatosplenomegaly MSK: extremities are warm, no edema.  Skin: warm, no rash Neuro:  no focal deficits Psych: appropriate affect   Diagnostic Studies 04/2013 Echo LVEF 0000000, grade I diastolic dysfunction, mild MR  10/2014 PFTs: severe COPD    Assessment and Plan  1. CAD - isolated episodes of chest pain in February that have resolved without recurrence - we will continue to monitor. Continue current meds  2. Hyperlipidemia  - discussed again dietary changes to lower TGs - restart  her atorvastatin 80mg  daily  3. HTN - low bp's today by initial check. By manual bp 100/60. She denies any symptoms, we will continue current meds  4. Carotid stenosis - mild bilateral disease by [place] 01/2014, we will continue to monitor. Cost is an issue for her, we will keep f/u ultrasounds to a minimum. No indication for repeat testing at this time.   F/u 6 months. Request pcp labs.       Arnoldo Lenis, M.D.

## 2015-12-26 NOTE — Patient Instructions (Signed)
Medication Instructions:   Your physician has recommended you make the following change in your medication:   Restart atorvastatin 80 mg daily.  Continue all other medications the same.  Labwork: NONE  Testing/Procedures: NONE  Follow-Up:  Your physician recommends that you schedule a follow-up appointment in: 6 months. You will receive a reminder letter in the mail in about 4 months reminding you to call and schedule your appointment. If you don't receive this letter, please contact our office.  Any Other Special Instructions Will Be Listed Below (If Applicable).  If you need a refill on your cardiac medications before your next appointment, please call your pharmacy.

## 2016-01-09 ENCOUNTER — Encounter (INDEPENDENT_AMBULATORY_CARE_PROVIDER_SITE_OTHER): Payer: Self-pay | Admitting: Internal Medicine

## 2016-01-09 ENCOUNTER — Ambulatory Visit (INDEPENDENT_AMBULATORY_CARE_PROVIDER_SITE_OTHER): Payer: Medicare HMO | Admitting: Internal Medicine

## 2016-01-09 VITALS — BP 108/80 | HR 67 | Temp 98.2°F | Resp 18 | Ht 64.0 in | Wt 125.0 lb

## 2016-01-09 DIAGNOSIS — R634 Abnormal weight loss: Secondary | ICD-10-CM | POA: Diagnosis not present

## 2016-01-09 LAB — HEPATIC FUNCTION PANEL
ALBUMIN: 3.8 g/dL (ref 3.6–5.1)
ALT: 19 U/L (ref 6–29)
AST: 23 U/L (ref 10–35)
Alkaline Phosphatase: 124 U/L (ref 33–130)
BILIRUBIN TOTAL: 0.5 mg/dL (ref 0.2–1.2)
Bilirubin, Direct: 0.1 mg/dL (ref <=0.2)
Indirect Bilirubin: 0.4 mg/dL (ref 0.2–1.2)
Total Protein: 6.6 g/dL (ref 6.1–8.1)

## 2016-01-09 LAB — CBC WITH DIFFERENTIAL/PLATELET
BASOS ABS: 0 {cells}/uL (ref 0–200)
Basophils Relative: 0 %
EOS PCT: 2 %
Eosinophils Absolute: 186 cells/uL (ref 15–500)
HCT: 41.3 % (ref 35.0–45.0)
Hemoglobin: 13.7 g/dL (ref 11.7–15.5)
Lymphocytes Relative: 25 %
Lymphs Abs: 2325 cells/uL (ref 850–3900)
MCH: 29.6 pg (ref 27.0–33.0)
MCHC: 33.2 g/dL (ref 32.0–36.0)
MCV: 89.2 fL (ref 80.0–100.0)
MONOS PCT: 9 %
MPV: 10.6 fL (ref 7.5–12.5)
Monocytes Absolute: 837 cells/uL (ref 200–950)
NEUTROS ABS: 5952 {cells}/uL (ref 1500–7800)
NEUTROS PCT: 64 %
PLATELETS: 316 10*3/uL (ref 140–400)
RBC: 4.63 MIL/uL (ref 3.80–5.10)
RDW: 14.2 % (ref 11.0–15.0)
WBC: 9.3 10*3/uL (ref 3.8–10.8)

## 2016-01-09 NOTE — Progress Notes (Signed)
Subjective:    Patient ID: [name], female    DOB: 1950-07-30, 65 y.o.   MRN: [id number]  HPI Here today for f/u. Underwent an EGD in October for weight loss. She says she has some rectal bleeding but she thinks it was from hemorrhoids. She is eating 2 meals a day.  She says she is extremely depressed. She has been depressed for years.  She smokes about 1/2 pack a day.  She has a BM daily.  Weight today 125. Last weight 131.  She c/o rt lower chest pain (posterior) with fever off and on.  She is very rude to her future daughter in law who is present in room.  11/23/2015 EGD:  Weight loss.  Impression:               - Normal esophagus.                           - Z-line irregular, 37 cm from the incisors.                           - Gastritis. Biopsied.                           - Normal duodenal bulb and second portion of the                            duodenum. Gastric biopsies also mild gastritis but no evidence of H. pylori infection..     11/18/2013   Colonoscopy  Indications:  Patient is 65 year old Caucasian female who is undergoing screening colonoscopy. This is patient's first exam. Her brother was diagnosed with CRC at age 14 and died of metastatic disease 2 years later. Ulcer is a cousin with surgery for CRC in his 61s. She has intermittent hematochezia felt to be secondary to hemorrhoids. Impression:  Examination performed to cecum. 3 mm polyp cold snared from sigmoid colon. External hemorrhoids.  Patient had single small polyp removed and it is tubular adenoma Patient called and message/results left on answering service Next colonoscopy in 5 years(positive family history). Report to PCP   Review of Systems Past Medical History:  Diagnosis Date  . Anxiety   . Arthritis   . Chronic back pain   . Complication of anesthesia   . Depression   . Depression   . Hypertension   . Myocardial infarction    x2.  [name] PONV (postoperative nausea and vomiting)      Past Surgical History:  Procedure Laterality Date  . ABDOMINAL HYSTERECTOMY    . APPENDECTOMY    . BACK SURGERY     x3; fusion with pins and rods  . BREAST LUMPECTOMY Left   . CHOLECYSTECTOMY    . COLONOSCOPY N/A 11/18/2013   Procedure: COLONOSCOPY;  Surgeon: Rogene Houston, MD;  [facility];  Service: Endoscopy;  Laterality: N/A;  1200  . CORONARY ARTERY BYPASS GRAFT N/A 01/22/2013   Procedure: Coronary Artery Bypass Grafting Times Four Using Left Internal Mammary Artery and Right Saphenous Leg Vein Harvested Endoscopically;  Surgeon: Grace Isaac, MD;  Location: [place];  Service: Open Heart Surgery;  Laterality: N/A;  . ESOPHAGOGASTRODUODENOSCOPY (EGD) WITH PROPOFOL N/A 11/23/2015   Procedure: ESOPHAGOGASTRODUODENOSCOPY (EGD) WITH PROPOFOL;  Surgeon: Rogene Houston, MD;  [facility];  Service: Endoscopy;  Laterality: N/A;  12:35  . EXPLORATORY LAPAROTOMY    . LEFT HEART CATHETERIZATION WITH CORONARY ANGIOGRAM  01/22/2013   Procedure: LEFT HEART CATHETERIZATION WITH CORONARY ANGIOGRAM;  Surgeon: Lorretta Harp, MD;  Location[facility];  Service: Cardiovascular;;  . ORIF ANKLE FRACTURE Left   . SPINAL FUSION     x 2  . TONSILLECTOMY     age 35  . TUBAL LIGATION      Allergies  Allergen Reactions  . Black Pepper [Piper] Hives, Itching and Rash  . Other Hives, Itching, Rash and Other (See Comments)    METALS  . Tape Hives, Itching and Rash    Current Outpatient Prescriptions on File Prior to Visit  Medication Sig Dispense Refill  . albuterol (PROVENTIL HFA;VENTOLIN HFA) 108 (90 Base) MCG/ACT inhaler Inhale 2 puffs into the lungs every 6 (six) hours as needed for wheezing or shortness of breath.    [name] aspirin EC 81 MG tablet Take 81 mg by mouth daily.    [name] atorvastatin (LIPITOR) 80 MG tablet Take 1 tablet (80 mg total) by mouth daily. 90 tablet 3  . carvedilol (COREG) 3.125 MG tablet Take 1 tablet (3.125 mg total) by mouth 2 (two) times  daily. 60 tablet 6  . diazepam (VALIUM) 5 MG tablet Take 5 mg by mouth every 6 (six) hours as needed for anxiety.     [name] FLUoxetine (PROZAC) 40 MG capsule Take 40 mg by mouth daily.    [name] HYDROcodone-acetaminophen (NORCO) 7.5-325 MG tablet Take 1 tablet by mouth 4 (four) times daily.    [name] lisinopril (PRINIVIL,ZESTRIL) 2.5 MG tablet Take 1 tablet (2.5 mg total) by mouth daily. 90 tablet 3  . venlafaxine XR (EFFEXOR-XR) 75 MG 24 hr capsule Take 75 mg by mouth at bedtime.      No current facility-administered medications on file prior to visit.        Objective:   Physical Exam Blood pressure 108/80, pulse 67, temperature 98.2 F (36.8 C), temperature source Oral, resp. rate 18, height 5\' 4"  (1.626 m), weight 125 lb (56.7 kg). Alert and oriented. Skin warm and dry. Oral mucosa is moist.   . Sclera anicteric, conjunctivae is pink. Thyroid not enlarged. No cervical lymphadenopathy. Bilateral wheezes. Heart regular rate and rhythm.  Abdomen is soft. Bowel sounds are positive. No hepatomegaly. No abdominal masses felt. No tenderness.  No edema to lower extremities.         Assessment & Plan:  Weight loss.  She has lost 6 pounds since October. Patient needs to quit smoking.  CBC and Hepatic function today.  She does not want a referral to [facility].  She is not suicidal.  Needs to f/u with Dr. Woody Seller concerning her chest pain.

## 2016-01-09 NOTE — Patient Instructions (Signed)
OV in 3 months. 

## 2016-01-15 ENCOUNTER — Encounter (HOSPITAL_COMMUNITY): Payer: Self-pay

## 2016-01-15 ENCOUNTER — Emergency Department (HOSPITAL_COMMUNITY)
Admission: EM | Admit: 2016-01-15 | Discharge: 2016-01-17 | Disposition: A | Discharge status: Home / Self Care | Payer: Medicare HMO | Attending: Emergency Medicine | Admitting: Emergency Medicine

## 2016-01-15 DIAGNOSIS — F331 Major depressive disorder, recurrent, moderate: Secondary | ICD-10-CM

## 2016-01-15 DIAGNOSIS — J42 Unspecified chronic bronchitis: Secondary | ICD-10-CM | POA: Diagnosis not present

## 2016-01-15 DIAGNOSIS — Z720 Tobacco use: Secondary | ICD-10-CM

## 2016-01-15 DIAGNOSIS — R45851 Suicidal ideations: Secondary | ICD-10-CM | POA: Diagnosis present

## 2016-01-15 DIAGNOSIS — Z79899 Other long term (current) drug therapy: Secondary | ICD-10-CM | POA: Insufficient documentation

## 2016-01-15 DIAGNOSIS — Z9851 Tubal ligation status: Secondary | ICD-10-CM | POA: Diagnosis not present

## 2016-01-15 DIAGNOSIS — F332 Major depressive disorder, recurrent severe without psychotic features: Secondary | ICD-10-CM | POA: Diagnosis present

## 2016-01-15 DIAGNOSIS — F17210 Nicotine dependence, cigarettes, uncomplicated: Secondary | ICD-10-CM | POA: Diagnosis not present

## 2016-01-15 DIAGNOSIS — Z9049 Acquired absence of other specified parts of digestive tract: Secondary | ICD-10-CM | POA: Diagnosis not present

## 2016-01-15 DIAGNOSIS — I252 Old myocardial infarction: Secondary | ICD-10-CM | POA: Diagnosis not present

## 2016-01-15 DIAGNOSIS — Z7982 Long term (current) use of aspirin: Secondary | ICD-10-CM | POA: Diagnosis not present

## 2016-01-15 DIAGNOSIS — F321 Major depressive disorder, single episode, moderate: Secondary | ICD-10-CM

## 2016-01-15 DIAGNOSIS — Z9889 Other specified postprocedural states: Secondary | ICD-10-CM | POA: Diagnosis not present

## 2016-01-15 DIAGNOSIS — I10 Essential (primary) hypertension: Secondary | ICD-10-CM | POA: Diagnosis not present

## 2016-01-15 DIAGNOSIS — E119 Type 2 diabetes mellitus without complications: Secondary | ICD-10-CM | POA: Insufficient documentation

## 2016-01-15 LAB — COMPREHENSIVE METABOLIC PANEL
ALT: 12 U/L — AB (ref 14–54)
AST: 14 U/L — AB (ref 15–41)
Albumin: 3.7 g/dL (ref 3.5–5.0)
Alkaline Phosphatase: 86 U/L (ref 38–126)
Anion gap: 9 (ref 5–15)
BUN: 20 mg/dL (ref 6–20)
CHLORIDE: 94 mmol/L — AB (ref 101–111)
CO2: 27 mmol/L (ref 22–32)
CREATININE: 1.36 mg/dL — AB (ref 0.44–1.00)
Calcium: 9.2 mg/dL (ref 8.9–10.3)
GFR, EST AFRICAN AMERICAN: 46 mL/min — AB (ref >60)
GFR, EST NON AFRICAN AMERICAN: 40 mL/min — AB (ref >60)
Glucose, Bld: 214 mg/dL — ABNORMAL HIGH (ref 65–99)
POTASSIUM: 3.9 mmol/L (ref 3.5–5.1)
SODIUM: 130 mmol/L — AB (ref 135–145)
Total Bilirubin: 0.1 mg/dL — ABNORMAL LOW (ref 0.3–1.2)
Total Protein: 7.1 g/dL (ref 6.5–8.1)

## 2016-01-15 LAB — CBC WITH DIFFERENTIAL/PLATELET
BASOS ABS: 0 10*3/uL (ref 0.0–0.1)
Basophils Relative: 0 %
Eosinophils Absolute: 0 10*3/uL (ref 0.0–0.7)
Eosinophils Relative: 0 %
HEMATOCRIT: 44.2 % (ref 36.0–46.0)
HEMOGLOBIN: 14.7 g/dL (ref 12.0–15.0)
LYMPHS ABS: 2.2 10*3/uL (ref 0.7–4.0)
LYMPHS PCT: 14 %
MCH: 29.8 pg (ref 26.0–34.0)
MCHC: 33.3 g/dL (ref 30.0–36.0)
MCV: 89.7 fL (ref 78.0–100.0)
Monocytes Absolute: 1 10*3/uL (ref 0.1–1.0)
Monocytes Relative: 7 %
NEUTROS ABS: 12.4 10*3/uL — AB (ref 1.7–7.7)
Neutrophils Relative %: 79 %
PLATELETS: 416 10*3/uL — AB (ref 150–400)
RBC: 4.93 MIL/uL (ref 3.87–5.11)
RDW: 13.6 % (ref 11.5–15.5)
WBC: 15.6 10*3/uL — AB (ref 4.0–10.5)

## 2016-01-15 LAB — ACETAMINOPHEN LEVEL: Acetaminophen (Tylenol), Serum: <10 ug/mL — ABNORMAL LOW (ref 10–30)

## 2016-01-15 LAB — ETHANOL

## 2016-01-15 LAB — SALICYLATE LEVEL: Salicylate Lvl: <7 mg/dL (ref 2.8–30.0)

## 2016-01-15 MED ORDER — HYDROCODONE-ACETAMINOPHEN 7.5-325 MG PO TABS
1.0000 | ORAL_TABLET | Freq: Four times a day (QID) | ORAL | Status: DC
  Administered 2016-01-16: 1 via ORAL
  Filled 2016-01-15: qty 1

## 2016-01-15 MED ORDER — CARVEDILOL 3.125 MG PO TABS
3.1250 mg | ORAL_TABLET | Freq: Two times a day (BID) | ORAL | Status: DC
  Administered 2016-01-16 – 2016-01-17 (×3): 3.125 mg via ORAL
  Filled 2016-01-15 (×5): qty 1

## 2016-01-15 MED ORDER — LISINOPRIL 5 MG PO TABS
2.5000 mg | ORAL_TABLET | Freq: Every day | ORAL | Status: DC
  Administered 2016-01-16 – 2016-01-17 (×2): 2.5 mg via ORAL
  Filled 2016-01-15 (×2): qty 1

## 2016-01-15 MED ORDER — ALBUTEROL SULFATE HFA 108 (90 BASE) MCG/ACT IN AERS: 2.0000 | INHALATION_SPRAY | Freq: Four times a day (QID) | RESPIRATORY_TRACT | Status: DC | PRN

## 2016-01-15 MED ORDER — FLUOXETINE HCL 20 MG PO CAPS
40.0000 mg | ORAL_CAPSULE | Freq: Every day | ORAL | Status: DC
  Administered 2016-01-16 – 2016-01-17 (×2): 40 mg via ORAL
  Filled 2016-01-15 (×4): qty 2

## 2016-01-15 MED ORDER — LEVOFLOXACIN 500 MG PO TABS
500.0000 mg | ORAL_TABLET | Freq: Every day | ORAL | Status: DC
  Administered 2016-01-16 – 2016-01-17 (×2): 500 mg via ORAL
  Filled 2016-01-15 (×2): qty 1

## 2016-01-15 MED ORDER — ASPIRIN EC 81 MG PO TBEC
81.0000 mg | DELAYED_RELEASE_TABLET | Freq: Every day | ORAL | Status: DC
  Administered 2016-01-16 – 2016-01-17 (×2): 81 mg via ORAL
  Filled 2016-01-15 (×2): qty 1

## 2016-01-15 MED ORDER — ATORVASTATIN CALCIUM 40 MG PO TABS
80.0000 mg | ORAL_TABLET | Freq: Every morning | ORAL | Status: DC
  Administered 2016-01-16 – 2016-01-17 (×2): 80 mg via ORAL
  Filled 2016-01-15 (×3): qty 2

## 2016-01-15 MED ORDER — IBUPROFEN 400 MG PO TABS: 600.0000 mg | ORAL_TABLET | Freq: Three times a day (TID) | ORAL | Status: DC | PRN

## 2016-01-15 MED ORDER — VITAMIN D (ERGOCALCIFEROL) 1.25 MG (50000 UNIT) PO CAPS
50000.0000 [IU] | ORAL_CAPSULE | ORAL | Status: DC
  Administered 2016-01-16: 50000 [IU] via ORAL
  Filled 2016-01-15: qty 1

## 2016-01-15 MED ORDER — ALUM & MAG HYDROXIDE-SIMETH 200-200-20 MG/5ML PO SUSP: 30.0000 mL | ORAL | Status: DC | PRN

## 2016-01-15 MED ORDER — VENLAFAXINE HCL ER 37.5 MG PO CP24
75.0000 mg | ORAL_CAPSULE | Freq: Every day | ORAL | Status: DC
  Administered 2016-01-16 (×2): 75 mg via ORAL

## 2016-01-15 MED ORDER — ACETAMINOPHEN 325 MG PO TABS: 650.0000 mg | ORAL_TABLET | ORAL | Status: DC | PRN

## 2016-01-15 MED ORDER — ONDANSETRON HCL 4 MG PO TABS: 4.0000 mg | ORAL_TABLET | Freq: Three times a day (TID) | ORAL | Status: DC | PRN

## 2016-01-15 MED ORDER — DIAZEPAM 5 MG PO TABS
5.0000 mg | ORAL_TABLET | Freq: Four times a day (QID) | ORAL | Status: DC
  Administered 2016-01-16 – 2016-01-17 (×7): 5 mg via ORAL
  Filled 2016-01-15 (×7): qty 1

## 2016-01-15 MED ORDER — PREDNISONE 10 MG PO TABS
10.0000 mg | ORAL_TABLET | Freq: Every day | ORAL | Status: DC
  Administered 2016-01-16 – 2016-01-17 (×2): 10 mg via ORAL
  Filled 2016-01-15 (×2): qty 1

## 2016-01-15 MED ORDER — NICOTINE 21 MG/24HR TD PT24
21.0000 mg | MEDICATED_PATCH | Freq: Every day | TRANSDERMAL | Status: DC
  Filled 2016-01-15: qty 1

## 2016-01-15 MED ORDER — UMECLIDINIUM-VILANTEROL 62.5-25 MCG/INH IN AEPB
1.0000 | INHALATION_SPRAY | Freq: Every day | RESPIRATORY_TRACT | Status: DC
Start: 2016-01-16 — End: 2016-01-17
  Administered 2016-01-16 – 2016-01-17 (×2): 1 via RESPIRATORY_TRACT
  Filled 2016-01-15: qty 14

## 2016-01-15 NOTE — ED Triage Notes (Signed)
I am about to have a nervous breakdown and every time I try to come in here to have something done about it my husband has a fit.  Feel like I want to scream and scream.  Having a lot of problems with anxiety, feel like giving up, feel like quitting.  There have been days that I have wanted to hurt myself, but I promised my son that I would not.  I do not want to hurt myself or anyone at this time.

## 2016-01-15 NOTE — ED Provider Notes (Signed)
[facility] DEPT Provider Note   CSN: [id number] Arrival date & time: 01/15/16  2000 By signing my name below, I, Dyke Brackett, attest that this documentation has been prepared under the direction and in the presence of Isla Pence, MD . Electronically Signed: Dyke Brackett, Scribe. 01/15/2016. 8:42 PM.   History   Chief Complaint Chief Complaint  Patient presents with  . Depression    HPI [name] is a 65 y.o. female with a hx of anxiety, depression and MDD who presents to the Emergency Department complaining of worsening, constant anxiety which began a while ago, but has become severe today. Pt states she feels like giving up and is overwhelmed with everything at home. Pt reports that she promised her children she would never hurt herself.  She has pneumonia and COPD and has started prednisone.  On the first day of prednisone, with the high dose, She experienced auditory hallucinations. No alleviating or modifying factors noted.  She is not currently followed by a psychiatrist; se states that it is too costly and that her husband does not support her going to one. She is a current 1/2 pack per day smoker, and denies any alcohol use.  Pt denies any HI, new CP, abdominal pain, back pain or any other associated symptoms.   The history is provided by the patient. No language interpreter was used.    Past Medical History:  Diagnosis Date  . Anxiety   . Arthritis   . Chronic back pain   . Complication of anesthesia   . Depression   . Depression   . Hypertension   . Myocardial infarction    x2.  [name] PONV (postoperative nausea and vomiting)     Patient Active Problem List   Diagnosis Date Noted  . Anorexia nervosa 10/10/2015  . Loss of weight 10/10/2015  . MDD (major depressive disorder), recurrent episode, severe ([place]) 08/22/2015  . Family hx of colon cancer 11/08/2013  . Medication management 11/08/2013  . DM2 (diabetes mellitus, type 2) ([place]) 01/28/2013  .  Cardiomyopathy, ischemic- EF 40% at cath 01/24/2013  . Dyslipidemia 01/24/2013  . STEMI 01/22/13 01/22/2013  . Tobacco abuse 01/22/2013  . S/P urgent CABG x 4 01/22/13 01/22/2013    Past Surgical History:  Procedure Laterality Date  . ABDOMINAL HYSTERECTOMY    . APPENDECTOMY    . BACK SURGERY     x3; fusion with pins and rods  . BREAST LUMPECTOMY Left   . CHOLECYSTECTOMY    . COLONOSCOPY N/A 11/18/2013   Procedure: COLONOSCOPY;  Surgeon: Rogene Houston, MD;  [facility];  Service: Endoscopy;  Laterality: N/A;  1200  . CORONARY ARTERY BYPASS GRAFT N/A 01/22/2013   Procedure: Coronary Artery Bypass Grafting Times Four Using Left Internal Mammary Artery and Right Saphenous Leg Vein Harvested Endoscopically;  Surgeon: Grace Isaac, MD;  Location: [facility];  Service: Open Heart Surgery;  Laterality: N/A;  . ESOPHAGOGASTRODUODENOSCOPY (EGD) WITH PROPOFOL N/A 11/23/2015   Procedure: ESOPHAGOGASTRODUODENOSCOPY (EGD) WITH PROPOFOL;  Surgeon: Rogene Houston, MD;  [facility];  Service: Endoscopy;  Laterality: N/A;  12:35  . EXPLORATORY LAPAROTOMY    . LEFT HEART CATHETERIZATION WITH CORONARY ANGIOGRAM  01/22/2013   Procedure: LEFT HEART CATHETERIZATION WITH CORONARY ANGIOGRAM;  Surgeon: Lorretta Harp, MD;  Location: [facility] CATH LAB;  Service: Cardiovascular;;  . ORIF ANKLE FRACTURE Left   . SPINAL FUSION     x 2  . TONSILLECTOMY     age  10  . TUBAL LIGATION      OB History    Gravida Para Term Preterm AB Living             3   SAB TAB Ectopic Multiple Live Births                   Home Medications    Prior to Admission medications   Medication Sig Start Date End Date Taking? Authorizing Provider  albuterol (PROVENTIL HFA;VENTOLIN HFA) 108 (90 Base) MCG/ACT inhaler Inhale 2 puffs into the lungs every 6 (six) hours as needed for wheezing or shortness of breath.   Yes Historical Provider, MD  ANORO ELLIPTA 62.5-25 MCG/INH AEPB Inhale 1 puff into the lungs  daily.  01/08/16  Yes Historical Provider, MD  aspirin EC 81 MG tablet Take 81 mg by mouth daily.   Yes Historical Provider, MD  atorvastatin (LIPITOR) 80 MG tablet Take 1 tablet (80 mg total) by mouth daily. Patient taking differently: Take 80 mg by mouth every morning.  12/26/15 03/25/16 Yes Arnoldo Lenis, MD  carvedilol (COREG) 3.125 MG tablet Take 1 tablet (3.125 mg total) by mouth 2 (two) times daily. 04/18/13  Yes Arnoldo Lenis, MD  diazepam (VALIUM) 5 MG tablet Take 5 mg by mouth 4 (four) times daily.  08/12/15  Yes Historical Provider, MD  ergocalciferol (VITAMIN D2) 50000 units capsule Take 50,000 Units by mouth every Wednesday.    Yes Historical Provider, MD  FLUoxetine (PROZAC) 40 MG capsule Take 40 mg by mouth daily.   Yes Historical Provider, MD  HYDROcodone-acetaminophen (NORCO) 7.5-325 MG tablet Take 1 tablet by mouth 4 (four) times daily.   Yes Historical Provider, MD  levofloxacin (LEVAQUIN) 500 MG tablet Take 500 mg by mouth daily. 10 day course starting on 01/09/2016 01/09/16  Yes Historical Provider, MD  lisinopril (PRINIVIL,ZESTRIL) 2.5 MG tablet Take 1 tablet (2.5 mg total) by mouth daily. 04/30/15  Yes Arnoldo Lenis, MD  predniSONE (DELTASONE) 10 MG tablet Take 10 mg by mouth daily. 10 day course starting on 01/09/16 01/09/16  Yes Historical Provider, MD  venlafaxine XR (EFFEXOR-XR) 75 MG 24 hr capsule Take 75 mg by mouth at bedtime.  09/18/15  Yes Historical Provider, MD    Family History Family History  Problem Relation Age of Onset  . Alcohol abuse Father   . Schizophrenia Father   . Alcohol abuse Sister   . Alcohol abuse Brother   . Heart disease      Social History Social History  Substance Use Topics  . Smoking status: Current Every Day Smoker    Packs/day: 1.00    Years: 50.00    Types: Cigarettes, E-cigarettes    Start date: 01/30/1965  . Smokeless tobacco: Never Used     Comment: gradually cutting back. now down to half a pack daily   . Alcohol  use No     Comment: 04-05-15 per pt no     Allergies   Black pepper [piper]; Other; and Tape   Review of Systems Review of Systems 10 systems reviewed and all are negative for acute change except as noted in the HPI.   Physical Exam Updated Vital Signs BP 137/63 (BP Location: Left Arm)   Pulse 72   Temp 97.6 F (36.4 C) (Oral)   Resp 17   Ht 5\' 4"  (1.626 m)   Wt 120 lb (54.4 kg)   SpO2 97%   BMI 20.60 kg/m   Physical Exam  Constitutional: She is oriented to person, place, and time. She appears well-developed and well-nourished. No distress.  HENT:  Head: Normocephalic and atraumatic.  Eyes: Conjunctivae are normal.  Cardiovascular: Normal rate.   Pulmonary/Chest: Effort normal. She has wheezes.  Abdominal: She exhibits no distension.  Neurological: She is alert and oriented to person, place, and time.  Skin: Skin is warm and dry.  Psychiatric: Her mood appears anxious. She exhibits a depressed mood.  Nursing note and vitals reviewed.   ED Treatments / Results  DIAGNOSTIC STUDIES:  Oxygen Saturation is 97% on RA, normal by my interpretation.    COORDINATION OF CARE:  8:17 PM Discussed treatment plan with pt at bedside and pt agreed to plan.  Labs (all labs ordered are listed, but only abnormal results are displayed) Labs Reviewed  COMPREHENSIVE METABOLIC PANEL - Abnormal; Notable for the following:       Result Value   Sodium 130 (*)    Chloride 94 (*)    Glucose, Bld 214 (*)    Creatinine, Ser 1.36 (*)    AST 14 (*)    ALT 12 (*)    Total Bilirubin 0.1 (*)    GFR calc non Af Amer 40 (*)    GFR calc Af Amer 46 (*)    All other components within normal limits  CBC WITH DIFFERENTIAL/PLATELET - Abnormal; Notable for the following:    WBC 15.6 (*)    Platelets 416 (*)    Neutro Abs 12.4 (*)    All other components within normal limits  ACETAMINOPHEN LEVEL - Abnormal; Notable for the following:    Acetaminophen (Tylenol), Serum <10 (*)    All other  components within normal limits  ETHANOL  SALICYLATE LEVEL  RAPID URINE DRUG SCREEN, HOSP PERFORMED  URINALYSIS, ROUTINE W REFLEX MICROSCOPIC    EKG  EKG Interpretation None       Radiology No results found.  Procedures Procedures (including critical care time)  Medications Ordered in ED Medications  acetaminophen (TYLENOL) tablet 650 mg (not administered)  ibuprofen (ADVIL,MOTRIN) tablet 600 mg (not administered)  nicotine (NICODERM CQ - dosed in mg/24 hours) patch 21 mg (not administered)  ondansetron (ZOFRAN) tablet 4 mg (not administered)  alum & mag hydroxide-simeth (MAALOX/MYLANTA) 200-200-20 MG/5ML suspension 30 mL (not administered)  albuterol (PROVENTIL HFA;VENTOLIN HFA) 108 (90 Base) MCG/ACT inhaler 2 puff (not administered)  umeclidinium-vilanterol (ANORO ELLIPTA) 62.5-25 MCG/INH 1 puff (not administered)  aspirin EC tablet 81 mg (not administered)  atorvastatin (LIPITOR) tablet 80 mg (not administered)  carvedilol (COREG) tablet 3.125 mg (not administered)  diazepam (VALIUM) tablet 5 mg (not administered)  ergocalciferol (VITAMIN D2) capsule 50,000 Units (not administered)  FLUoxetine (PROZAC) capsule 40 mg (not administered)  HYDROcodone-acetaminophen (NORCO) 7.5-325 MG per tablet 1 tablet (not administered)  levofloxacin (LEVAQUIN) tablet 500 mg (not administered)  lisinopril (PRINIVIL,ZESTRIL) tablet 2.5 mg (not administered)  predniSONE (DELTASONE) tablet 10 mg (not administered)  venlafaxine XR (EFFEXOR-XR) 24 hr capsule 75 mg (not administered)     Initial Impression / Assessment and Plan / ED Course  I have reviewed the triage vital signs and the nursing notes.  Pertinent labs & imaging results that were available during my care of the patient were reviewed by me and considered in my medical decision making (see chart for details).  Clinical Course     Pt awaiting TTS consult.  The pt is placed in a psych hold and home medications ordered.   Disposition pending TTS evaluation.  Final Clinical  Impressions(s) / ED Diagnoses   Final diagnoses:  Moderate episode of recurrent major depressive disorder (HCC)  Tobacco abuse  Chronic bronchitis, unspecified chronic bronchitis type ([place])    New Prescriptions New Prescriptions   No medications on file  I personally performed the services described in this documentation, which was scribed in my presence. The recorded information has been reviewed and is accurate.    Isla Pence, MD 01/15/16 2250

## 2016-01-15 NOTE — ED Notes (Signed)
Pt states she does not need to go to bathroom at this time. States the only way she can go at home is to "take a cigarette into the bathroom and smoke".
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
## 2016-01-15 NOTE — ED Notes (Signed)
Contact pt's daughter; Shirleen Schirmer at [phone] for any questions or concerns.

## 2016-01-15 NOTE — Progress Notes (Signed)
Patient already has an appointment scheduled with Deberah Castle, NP on 04/08/16.  Discussed with Karna Christmas and she stated it was okay for her to keep that March appointment.

## 2016-01-16 ENCOUNTER — Encounter (HOSPITAL_COMMUNITY): Payer: Self-pay | Admitting: Emergency Medicine

## 2016-01-16 LAB — URINALYSIS, ROUTINE W REFLEX MICROSCOPIC
BILIRUBIN URINE: NEGATIVE
Glucose, UA: NEGATIVE mg/dL
Hgb urine dipstick: NEGATIVE
KETONES UR: NEGATIVE mg/dL
Leukocytes, UA: NEGATIVE
NITRITE: NEGATIVE
Protein, ur: NEGATIVE mg/dL
SPECIFIC GRAVITY, URINE: 1.016 (ref 1.005–1.030)
pH: 5 (ref 5.0–8.0)

## 2016-01-16 LAB — RAPID URINE DRUG SCREEN, HOSP PERFORMED
AMPHETAMINES: NOT DETECTED
Barbiturates: NOT DETECTED
Benzodiazepines: POSITIVE — AB
Cocaine: NOT DETECTED
Opiates: POSITIVE — AB
Tetrahydrocannabinol: NOT DETECTED

## 2016-01-16 MED ORDER — HYDROCODONE-ACETAMINOPHEN 7.5-325 MG/15ML PO SOLN
15.0000 mL | Freq: Four times a day (QID) | ORAL | Status: AC
  Administered 2016-01-16: 15 mL via ORAL
  Filled 2016-01-16: qty 15

## 2016-01-16 MED ORDER — HYDROCODONE-ACETAMINOPHEN 7.5-325 MG/15ML PO SOLN
15.0000 mL | Freq: Four times a day (QID) | ORAL | Status: DC
  Administered 2016-01-16 – 2016-01-17 (×4): 15 mL via ORAL
  Filled 2016-01-16 (×4): qty 15

## 2016-01-16 MED ORDER — HYDROCODONE-ACETAMINOPHEN 7.5-325 MG/15ML PO SOLN
ORAL | Status: AC
  Administered 2016-01-16: 15 mL
  Filled 2016-01-16: qty 15

## 2016-01-16 MED ORDER — VENLAFAXINE HCL ER 75 MG PO CP24
ORAL_CAPSULE | ORAL | Status: AC
  Filled 2016-01-16: qty 1

## 2016-01-16 NOTE — ED Notes (Signed)
TTS reevaluation done.
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
## 2016-01-16 NOTE — ED Notes (Signed)
Pt sleeping. Sitter at bedside. Chest rise and fall noted
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
## 2016-01-16 NOTE — ED Notes (Signed)
Dr. Sabra Heck at bedside with pt and daughter.

## 2016-01-16 NOTE — ED Notes (Signed)
RN spoke with [name] at [facility]. Pt denies SI/HI or auditory/visual hallucinations today to assessment counselor and therefore does not meet inpatient criteria.

## 2016-01-16 NOTE — Discharge Instructions (Signed)
Screening by [facility] recommends outpatient treatment. Inpatient treatment declined. Return for any new or worse symptoms. Follow-up with your doctor and a behavioral health resources in [place].

## 2016-01-16 NOTE — ED Provider Notes (Signed)
I have discussed this case at length with the daughter and the patient. She is extremely tearful, she has had a significant failure to thrive secondary to her depression, she has lost 2 family members including her great-granddaughter and was totally feeling less because of her severe depression. She has ongoing thoughts of suicidal ideation and states that she is suicidal but makes a very distinct, that even though I'm suicidal and would never kill myself. At this point the patient is so severely depressed that I feel uncomfortable sending the patient home. She has no support system at home, she has been to multiple outpatient visits and feels as though she has been slighted, feels as though nobody listens to her, she is definitely not stable for discharge and will be held overnight for repeat evaluation the morning.  As she has been here for quite some time enough for [place] to have several psychiatric evaluations as well as repeat medical evaluations it is my firm recommendation that the patient be admitted to an inpatient psychiatric ward, she should not be discharged.   Noemi Chapel, MD 01/16/16 [phone]

## 2016-01-16 NOTE — BHH Counselor (Signed)
Pt reassessed.  Pt presented on 01/15/16 with complaint of depressive symptoms (excluding suicidal ideation and plan).  At the time, she stated that she did not feel safe going home.  Today, Pt said she was sleeping all day.  She continued to endorsed depressive symptoms and denied suicidal ideation and plan, auditory/visual hallucination, and homicidal ideation.  Today Pt stated that she felt safe to go home although she preferred to stay at the hospital.  She does not have a psychiatrist.  Consulted with Catalina Pizza, DNP, who agreed to discharge with follow-up to appropriate outpatient.  [facility] -- [place] -- [phone]

## 2016-01-17 DIAGNOSIS — Z9109 Other allergy status, other than to drugs and biological substances: Secondary | ICD-10-CM

## 2016-01-17 DIAGNOSIS — Z9049 Acquired absence of other specified parts of digestive tract: Secondary | ICD-10-CM

## 2016-01-17 DIAGNOSIS — Z9851 Tubal ligation status: Secondary | ICD-10-CM

## 2016-01-17 DIAGNOSIS — F332 Major depressive disorder, recurrent severe without psychotic features: Secondary | ICD-10-CM | POA: Diagnosis not present

## 2016-01-17 DIAGNOSIS — Z9889 Other specified postprocedural states: Secondary | ICD-10-CM

## 2016-01-17 DIAGNOSIS — Z811 Family history of alcohol abuse and dependence: Secondary | ICD-10-CM

## 2016-01-17 DIAGNOSIS — Z818 Family history of other mental and behavioral disorders: Secondary | ICD-10-CM

## 2016-01-17 DIAGNOSIS — Z79899 Other long term (current) drug therapy: Secondary | ICD-10-CM

## 2016-01-17 DIAGNOSIS — Z8249 Family history of ischemic heart disease and other diseases of the circulatory system: Secondary | ICD-10-CM

## 2016-01-17 DIAGNOSIS — F17210 Nicotine dependence, cigarettes, uncomplicated: Secondary | ICD-10-CM

## 2016-01-17 DIAGNOSIS — Z7982 Long term (current) use of aspirin: Secondary | ICD-10-CM

## 2016-01-17 NOTE — ED Notes (Signed)
Pt has been given meal tray.  

## 2016-01-17 NOTE — ED Notes (Signed)
Pt undergoing TTS at this time. Daughter is at bedside for this assessment.

## 2016-01-17 NOTE — ED Notes (Signed)
Meal given

## 2016-01-17 NOTE — ED Notes (Signed)
Daughter notified via phone that her mother would be re-evaluated after 9:30.
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
## 2016-01-17 NOTE — ED Notes (Signed)
This nurse has made multiple attempts to get plan of care update for this patient.

## 2016-02-01 DIAGNOSIS — Z713 Dietary counseling and surveillance: Secondary | ICD-10-CM | POA: Diagnosis not present

## 2016-02-01 DIAGNOSIS — R69 Illness, unspecified: Secondary | ICD-10-CM | POA: Diagnosis not present

## 2016-02-01 DIAGNOSIS — Z6821 Body mass index (BMI) 21.0-21.9, adult: Secondary | ICD-10-CM | POA: Diagnosis not present

## 2016-02-01 DIAGNOSIS — J441 Chronic obstructive pulmonary disease with (acute) exacerbation: Secondary | ICD-10-CM | POA: Diagnosis not present

## 2016-02-01 DIAGNOSIS — Z299 Encounter for prophylactic measures, unspecified: Secondary | ICD-10-CM | POA: Diagnosis not present

## 2016-02-18 DIAGNOSIS — I2510 Atherosclerotic heart disease of native coronary artery without angina pectoris: Secondary | ICD-10-CM | POA: Diagnosis not present

## 2016-02-18 DIAGNOSIS — E7800 Pure hypercholesterolemia, unspecified: Secondary | ICD-10-CM | POA: Diagnosis not present

## 2016-02-18 DIAGNOSIS — I10 Essential (primary) hypertension: Secondary | ICD-10-CM | POA: Diagnosis not present

## 2016-02-18 DIAGNOSIS — J449 Chronic obstructive pulmonary disease, unspecified: Secondary | ICD-10-CM | POA: Diagnosis not present

## 2016-03-10 DIAGNOSIS — Z79891 Long term (current) use of opiate analgesic: Secondary | ICD-10-CM | POA: Diagnosis not present

## 2016-03-10 DIAGNOSIS — M5136 Other intervertebral disc degeneration, lumbar region: Secondary | ICD-10-CM | POA: Diagnosis not present

## 2016-03-10 DIAGNOSIS — M545 Low back pain: Secondary | ICD-10-CM | POA: Diagnosis not present

## 2016-03-17 DIAGNOSIS — G8929 Other chronic pain: Secondary | ICD-10-CM | POA: Diagnosis not present

## 2016-03-17 DIAGNOSIS — R7989 Other specified abnormal findings of blood chemistry: Secondary | ICD-10-CM | POA: Diagnosis not present

## 2016-03-17 DIAGNOSIS — Z951 Presence of aortocoronary bypass graft: Secondary | ICD-10-CM | POA: Diagnosis not present

## 2016-03-17 DIAGNOSIS — E7800 Pure hypercholesterolemia, unspecified: Secondary | ICD-10-CM | POA: Diagnosis not present

## 2016-03-17 DIAGNOSIS — R202 Paresthesia of skin: Secondary | ICD-10-CM | POA: Diagnosis not present

## 2016-03-17 DIAGNOSIS — R0902 Hypoxemia: Secondary | ICD-10-CM | POA: Diagnosis not present

## 2016-03-17 DIAGNOSIS — Z981 Arthrodesis status: Secondary | ICD-10-CM | POA: Diagnosis not present

## 2016-03-17 DIAGNOSIS — J449 Chronic obstructive pulmonary disease, unspecified: Secondary | ICD-10-CM | POA: Diagnosis not present

## 2016-03-17 DIAGNOSIS — G459 Transient cerebral ischemic attack, unspecified: Secondary | ICD-10-CM | POA: Diagnosis not present

## 2016-03-17 DIAGNOSIS — Z955 Presence of coronary angioplasty implant and graft: Secondary | ICD-10-CM | POA: Diagnosis not present

## 2016-03-17 DIAGNOSIS — J441 Chronic obstructive pulmonary disease with (acute) exacerbation: Secondary | ICD-10-CM | POA: Diagnosis not present

## 2016-03-17 DIAGNOSIS — R42 Dizziness and giddiness: Secondary | ICD-10-CM | POA: Diagnosis not present

## 2016-03-17 DIAGNOSIS — R0600 Dyspnea, unspecified: Secondary | ICD-10-CM | POA: Diagnosis not present

## 2016-03-17 DIAGNOSIS — R269 Unspecified abnormalities of gait and mobility: Secondary | ICD-10-CM | POA: Diagnosis not present

## 2016-03-17 DIAGNOSIS — J9811 Atelectasis: Secondary | ICD-10-CM | POA: Diagnosis not present

## 2016-03-17 DIAGNOSIS — I2510 Atherosclerotic heart disease of native coronary artery without angina pectoris: Secondary | ICD-10-CM | POA: Diagnosis not present

## 2016-03-18 DIAGNOSIS — I2510 Atherosclerotic heart disease of native coronary artery without angina pectoris: Secondary | ICD-10-CM | POA: Diagnosis not present

## 2016-03-18 DIAGNOSIS — R269 Unspecified abnormalities of gait and mobility: Secondary | ICD-10-CM | POA: Diagnosis not present

## 2016-03-18 DIAGNOSIS — G459 Transient cerebral ischemic attack, unspecified: Secondary | ICD-10-CM | POA: Diagnosis not present

## 2016-03-18 DIAGNOSIS — J449 Chronic obstructive pulmonary disease, unspecified: Secondary | ICD-10-CM | POA: Diagnosis not present

## 2016-03-18 DIAGNOSIS — R42 Dizziness and giddiness: Secondary | ICD-10-CM | POA: Diagnosis not present

## 2016-03-19 DIAGNOSIS — J449 Chronic obstructive pulmonary disease, unspecified: Secondary | ICD-10-CM | POA: Diagnosis not present

## 2016-03-19 DIAGNOSIS — G459 Transient cerebral ischemic attack, unspecified: Secondary | ICD-10-CM | POA: Diagnosis not present

## 2016-03-19 DIAGNOSIS — I2510 Atherosclerotic heart disease of native coronary artery without angina pectoris: Secondary | ICD-10-CM | POA: Diagnosis not present

## 2016-03-19 DIAGNOSIS — R42 Dizziness and giddiness: Secondary | ICD-10-CM | POA: Diagnosis not present

## 2016-03-20 DIAGNOSIS — J449 Chronic obstructive pulmonary disease, unspecified: Secondary | ICD-10-CM | POA: Diagnosis not present

## 2016-03-20 DIAGNOSIS — E7800 Pure hypercholesterolemia, unspecified: Secondary | ICD-10-CM | POA: Diagnosis not present

## 2016-03-20 DIAGNOSIS — I2510 Atherosclerotic heart disease of native coronary artery without angina pectoris: Secondary | ICD-10-CM | POA: Diagnosis not present

## 2016-03-20 DIAGNOSIS — I10 Essential (primary) hypertension: Secondary | ICD-10-CM | POA: Diagnosis not present

## 2016-03-26 DIAGNOSIS — R270 Ataxia, unspecified: Secondary | ICD-10-CM | POA: Diagnosis not present

## 2016-03-26 DIAGNOSIS — I2510 Atherosclerotic heart disease of native coronary artery without angina pectoris: Secondary | ICD-10-CM | POA: Diagnosis not present

## 2016-03-26 DIAGNOSIS — J449 Chronic obstructive pulmonary disease, unspecified: Secondary | ICD-10-CM | POA: Diagnosis not present

## 2016-03-26 DIAGNOSIS — Z09 Encounter for follow-up examination after completed treatment for conditions other than malignant neoplasm: Secondary | ICD-10-CM | POA: Diagnosis not present

## 2016-03-27 DIAGNOSIS — R270 Ataxia, unspecified: Secondary | ICD-10-CM | POA: Diagnosis not present

## 2016-04-08 ENCOUNTER — Other Ambulatory Visit (INDEPENDENT_AMBULATORY_CARE_PROVIDER_SITE_OTHER): Payer: Self-pay | Admitting: Internal Medicine

## 2016-04-08 ENCOUNTER — Encounter (INDEPENDENT_AMBULATORY_CARE_PROVIDER_SITE_OTHER): Payer: Self-pay | Admitting: Internal Medicine

## 2016-04-08 ENCOUNTER — Ambulatory Visit (INDEPENDENT_AMBULATORY_CARE_PROVIDER_SITE_OTHER): Payer: Medicare HMO | Admitting: Internal Medicine

## 2016-04-08 ENCOUNTER — Encounter (INDEPENDENT_AMBULATORY_CARE_PROVIDER_SITE_OTHER): Payer: Self-pay

## 2016-04-08 VITALS — BP 122/62 | HR 64 | Temp 97.4°F | Ht 65.0 in | Wt 127.7 lb

## 2016-04-08 DIAGNOSIS — R634 Abnormal weight loss: Secondary | ICD-10-CM | POA: Diagnosis not present

## 2016-04-08 NOTE — Progress Notes (Signed)
   Subjective:    Patient ID: [name], female    DOB: 07-07-1950, 66 y.o.   MRN: [id number]  HPI  Here today for f/u. She was last seen in December.  Underwent an EGD in October of 2017 for weight loss/guaiac positive stools. 11/23/2015 EGD:  Weight loss.  Impression: - Normal esophagus. - Z-line irregular, 37 cm from the incisors. - Gastritis. Biopsied. - Normal duodenal bulb and second portion of the  duodenum. Gastric biopsies also mild gastritis but no evidence of H. pylori infection.. Her last weight in December was 125.  She tells me she has been gaining weight. Today her weight is 127.7lb. She feels emotionally better.  She says she thinks she had been depressed.  She had been having problems with her husband. She was divorced in 2015.  She is eating two meals a day and she says she grazes the rest of the day. She eats a lot of oatmeal.  She has a BM daily. No melena or BRRB.    Hx of cardiomyoopathy  11/18/2013 Colonoscopy  Indications:Patient is 66 year old Caucasian female who is undergoing screening colonoscopy. This is patient's first exam. Her brother was diagnosed with CRC at age 21 and died of metastatic disease 2 years later. Ulcer is a cousin with surgery for CRC in his 66s. She has intermittent hematochezia felt to be secondary to hemorrhoids. Impression:  Examination performed to cecum. 3 mm polyp cold snared from sigmoid colon. External hemorrhoids.  Patient had single small polyp removed and it is tubular adenoma Patient called and message/results left on answering service Next colonoscopy in 5 years(positive family history).    CBC    Component Value Date/Time   WBC 15.6 (H) 01/15/2016 2029   RBC 4.93 01/15/2016 2029   HGB 14.7 01/15/2016 2029   HCT 44.2 01/15/2016 2029   PLT 416 (H) 01/15/2016 2029   MCV 89.7 01/15/2016  2029   MCH 29.8 01/15/2016 2029   MCHC 33.3 01/15/2016 2029   RDW 13.6 01/15/2016 2029   LYMPHSABS 2.2 01/15/2016 2029   MONOABS 1.0 01/15/2016 2029   EOSABS 0.0 01/15/2016 2029   BASOSABS 0.0 01/15/2016 2029    Hepatic Function Panel     Component Value Date/Time   PROT 7.1 01/15/2016 2029   ALBUMIN 3.7 01/15/2016 2029   AST 14 (L) 01/15/2016 2029   ALT 12 (L) 01/15/2016 2029   ALKPHOS 86 01/15/2016 2029   BILITOT 0.1 (L) 01/15/2016 2029   BILIDIR 0.1 01/09/2016 1131   IBILI 0.4 01/09/2016 1131     Review of Systems     Objective:   Physical Exam Blood pressure 122/62, pulse 64, temperature 97.4 F (36.3 C), height 5\' 5"  (1.651 m), weight 127 lb 11.2 oz (57.9 kg).  Alert and oriented. Skin warm and dry. Oral mucosa is moist.   . Sclera anicteric, conjunctivae is pink. Thyroid not enlarged. No cervical lymphadenopathy. Lungs clear. Heart regular rate and rhythm.  Abdomen is soft. Bowel sounds are positive. No hepatomegaly. No abdominal masses felt. No tenderness.  No edema to lower extremities.         Assessment & Plan:  Weight loss. She has gained 2.7 pounds since her last visit. Her appetite has improved. Will continue to monitor. CBC today.  OV in 6 months.

## 2016-04-08 NOTE — Patient Instructions (Signed)
OV in 6 months. 

## 2016-04-09 DIAGNOSIS — R69 Illness, unspecified: Secondary | ICD-10-CM | POA: Diagnosis not present

## 2016-04-09 DIAGNOSIS — Z79891 Long term (current) use of opiate analgesic: Secondary | ICD-10-CM | POA: Diagnosis not present

## 2016-04-09 DIAGNOSIS — G8929 Other chronic pain: Secondary | ICD-10-CM | POA: Diagnosis not present

## 2016-04-09 DIAGNOSIS — M5136 Other intervertebral disc degeneration, lumbar region: Secondary | ICD-10-CM | POA: Diagnosis not present

## 2016-04-09 LAB — CBC
HEMOGLOBIN: 12.5 g/dL (ref 11.1–15.9)
Hematocrit: 37.9 % (ref 34.0–46.6)
MCH: 30 pg (ref 26.6–33.0)
MCHC: 33 g/dL (ref 31.5–35.7)
MCV: 91 fL (ref 79–97)
Platelets: 362 10*3/uL (ref 150–379)
RBC: 4.17 x10E6/uL (ref 3.77–5.28)
RDW: 15.8 % — ABNORMAL HIGH (ref 12.3–15.4)
WBC: 9.6 10*3/uL (ref 3.4–10.8)

## 2016-04-29 DIAGNOSIS — R69 Illness, unspecified: Secondary | ICD-10-CM | POA: Diagnosis not present

## 2016-04-29 DIAGNOSIS — I519 Heart disease, unspecified: Secondary | ICD-10-CM | POA: Diagnosis not present

## 2016-04-29 DIAGNOSIS — R296 Repeated falls: Secondary | ICD-10-CM | POA: Diagnosis not present

## 2016-04-29 DIAGNOSIS — M545 Low back pain: Secondary | ICD-10-CM | POA: Diagnosis not present

## 2016-04-29 DIAGNOSIS — H8112 Benign paroxysmal vertigo, left ear: Secondary | ICD-10-CM | POA: Diagnosis not present

## 2016-04-29 DIAGNOSIS — I10 Essential (primary) hypertension: Secondary | ICD-10-CM | POA: Diagnosis not present

## 2016-04-29 DIAGNOSIS — I951 Orthostatic hypotension: Secondary | ICD-10-CM | POA: Diagnosis not present

## 2016-05-09 ENCOUNTER — Other Ambulatory Visit: Payer: Self-pay | Admitting: Cardiology

## 2016-05-09 ENCOUNTER — Telehealth (HOSPITAL_COMMUNITY): Payer: Self-pay | Admitting: Physical Therapy

## 2016-05-09 DIAGNOSIS — J449 Chronic obstructive pulmonary disease, unspecified: Secondary | ICD-10-CM | POA: Diagnosis not present

## 2016-05-09 DIAGNOSIS — R69 Illness, unspecified: Secondary | ICD-10-CM | POA: Diagnosis not present

## 2016-05-09 DIAGNOSIS — Z6822 Body mass index (BMI) 22.0-22.9, adult: Secondary | ICD-10-CM | POA: Diagnosis not present

## 2016-05-09 DIAGNOSIS — Z299 Encounter for prophylactic measures, unspecified: Secondary | ICD-10-CM | POA: Diagnosis not present

## 2016-05-09 NOTE — Telephone Encounter (Signed)
Spoke with Vicente Males to schedule this apptment she is the pt's caregiver. Nf 05/09/16

## 2016-05-12 ENCOUNTER — Telehealth (HOSPITAL_COMMUNITY): Payer: Self-pay | Admitting: Physical Therapy

## 2016-05-12 ENCOUNTER — Telehealth (HOSPITAL_COMMUNITY): Payer: Self-pay | Admitting: *Deleted

## 2016-05-12 DIAGNOSIS — F17200 Nicotine dependence, unspecified, uncomplicated: Secondary | ICD-10-CM | POA: Diagnosis not present

## 2016-05-12 DIAGNOSIS — E7800 Pure hypercholesterolemia, unspecified: Secondary | ICD-10-CM | POA: Diagnosis not present

## 2016-05-12 DIAGNOSIS — I252 Old myocardial infarction: Secondary | ICD-10-CM | POA: Diagnosis not present

## 2016-05-12 DIAGNOSIS — F419 Anxiety disorder, unspecified: Secondary | ICD-10-CM | POA: Diagnosis not present

## 2016-05-12 DIAGNOSIS — I10 Essential (primary) hypertension: Secondary | ICD-10-CM | POA: Diagnosis not present

## 2016-05-12 DIAGNOSIS — Z79899 Other long term (current) drug therapy: Secondary | ICD-10-CM | POA: Diagnosis not present

## 2016-05-12 DIAGNOSIS — Z7982 Long term (current) use of aspirin: Secondary | ICD-10-CM | POA: Diagnosis not present

## 2016-05-12 DIAGNOSIS — J449 Chronic obstructive pulmonary disease, unspecified: Secondary | ICD-10-CM | POA: Diagnosis not present

## 2016-05-12 DIAGNOSIS — R69 Illness, unspecified: Secondary | ICD-10-CM | POA: Diagnosis not present
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
## 2016-05-13 DIAGNOSIS — E7800 Pure hypercholesterolemia, unspecified: Secondary | ICD-10-CM | POA: Diagnosis not present

## 2016-05-13 DIAGNOSIS — R45851 Suicidal ideations: Secondary | ICD-10-CM | POA: Diagnosis not present

## 2016-05-13 DIAGNOSIS — R69 Illness, unspecified: Secondary | ICD-10-CM | POA: Diagnosis not present

## 2016-05-13 DIAGNOSIS — I10 Essential (primary) hypertension: Secondary | ICD-10-CM | POA: Diagnosis not present

## 2016-05-13 DIAGNOSIS — M549 Dorsalgia, unspecified: Secondary | ICD-10-CM | POA: Diagnosis not present

## 2016-05-13 DIAGNOSIS — I2510 Atherosclerotic heart disease of native coronary artery without angina pectoris: Secondary | ICD-10-CM | POA: Diagnosis not present

## 2016-05-13 DIAGNOSIS — F4310 Post-traumatic stress disorder, unspecified: Secondary | ICD-10-CM | POA: Diagnosis not present

## 2016-05-13 DIAGNOSIS — J45909 Unspecified asthma, uncomplicated: Secondary | ICD-10-CM | POA: Diagnosis not present

## 2016-05-13 NOTE — Telephone Encounter (Signed)
She was NOT discharged by [place], she fired [place] as she preferred to go to [place] [facility]. She should direct all calls to that agency in the future

## 2016-05-13 NOTE — Telephone Encounter (Signed)
Called pt and lmtcb to inform her with what providers response was due to pt previous message/call.

## 2016-05-13 NOTE — Telephone Encounter (Signed)
Opened in Error.

## 2016-05-20 ENCOUNTER — Telehealth (HOSPITAL_COMMUNITY): Payer: Self-pay | Admitting: Physical Therapy

## 2016-05-20 ENCOUNTER — Ambulatory Visit (HOSPITAL_COMMUNITY): Payer: Medicare HMO | Attending: Neurology | Admitting: Physical Therapy

## 2016-05-20 DIAGNOSIS — R2681 Unsteadiness on feet: Secondary | ICD-10-CM

## 2016-05-20 DIAGNOSIS — R296 Repeated falls: Secondary | ICD-10-CM | POA: Diagnosis present

## 2016-05-20 DIAGNOSIS — H8111 Benign paroxysmal vertigo, right ear: Secondary | ICD-10-CM | POA: Diagnosis present

## 2016-05-20 DIAGNOSIS — J449 Chronic obstructive pulmonary disease, unspecified: Secondary | ICD-10-CM | POA: Diagnosis not present

## 2016-05-20 DIAGNOSIS — I10 Essential (primary) hypertension: Secondary | ICD-10-CM | POA: Diagnosis not present

## 2016-05-20 DIAGNOSIS — I2510 Atherosclerotic heart disease of native coronary artery without angina pectoris: Secondary | ICD-10-CM | POA: Diagnosis not present

## 2016-05-20 DIAGNOSIS — E7800 Pure hypercholesterolemia, unspecified: Secondary | ICD-10-CM | POA: Diagnosis not present

## 2016-05-20 NOTE — Telephone Encounter (Signed)
CR requested pt be moved up to 1pm and pt agreed. NF

## 2016-05-20 NOTE — Therapy (Signed)
[name], [place], [address] Phone: [phone]   Fax:  [phone]  Physical Therapy Evaluation  Patient Details  Name: [name] MRN: [id number] Date of Birth: Nov 16, 1950 Referring Provider: Phillips Odor  Encounter Date: 05/20/2016      PT End of Session - 05/20/16 1514    Visit Number 1   Number of Visits 8   Date for PT Re-Evaluation 06/19/16   Authorization Type Aetna medicare   Authorization - Visit Number 1   Authorization - Number of Visits 8   PT Start Time 9381   PT Stop Time 1349   PT Time Calculation (min) 41 min   Activity Tolerance Patient tolerated treatment well   Behavior During Therapy [facility] for tasks assessed/performed      Past Medical History:  Diagnosis Date  . Anxiety   . Arthritis   . Chronic back pain   . Complication of anesthesia   . Depression   . Depression   . Hypertension   . Myocardial infarction    x2.  [name] PONV (postoperative nausea and vomiting)     Past Surgical History:  Procedure Laterality Date  . ABDOMINAL HYSTERECTOMY    . APPENDECTOMY    . BACK SURGERY     x3; fusion with pins and rods  . BREAST LUMPECTOMY Left   . CHOLECYSTECTOMY    . COLONOSCOPY N/A 11/18/2013   Procedure: COLONOSCOPY;  Surgeon: [name];  [facility];  Service: Endoscopy;  Laterality: N/A;  1200  . CORONARY ARTERY BYPASS GRAFT N/A 01/22/2013   Procedure: Coronary Artery Bypass Grafting Times Four Using Left Internal Mammary Artery and Right Saphenous Leg Vein Harvested Endoscopically;  Surgeon: [name];  Location: [place];  Service: Open Heart Surgery;  Laterality: N/A;  . ESOPHAGOGASTRODUODENOSCOPY (EGD) WITH PROPOFOL N/A 11/23/2015   Procedure: ESOPHAGOGASTRODUODENOSCOPY (EGD) WITH PROPOFOL;  Surgeon: [name];  [facility];  Service: Endoscopy;  Laterality: N/A;  12:35  . EXPLORATORY LAPAROTOMY    . LEFT HEART  CATHETERIZATION WITH CORONARY ANGIOGRAM  01/22/2013   Procedure: LEFT HEART CATHETERIZATION WITH CORONARY ANGIOGRAM;  Surgeon: [name];  Location: [facility] CATH LAB;  Service: Cardiovascular;;  . ORIF ANKLE FRACTURE Left   . SPINAL FUSION     x 2  . TONSILLECTOMY     age 66  . TUBAL LIGATION      There were no vitals filed for this visit.       Subjective Assessment - 05/20/16 1309    Subjective Ms. [name] is a 66 yo female who has been referred to physical therapy for dizziness.  She states that her Vaughn feels that it is from her left ear.    Pertinent History anxiety, spinalfusion, depression HTN, CAD, MI   How long can you sit comfortably? no problem   How long can you stand comfortably? not long due to back surgeries    How long can you walk comfortably? not long due to back and heart issues.    Patient Stated Goals less dizziness    Currently in Pain? Yes  not why pt has been referred.    Pain Score 7    Pain Location Back   Pain Orientation Lower   Pain Type Chronic pain   Pain Onset More than a month ago   Pain Frequency Constant   Aggravating Factors  car riding    Pain  Relieving Factors meds   Effect of Pain on Daily Activities increases             [facility] PT Assessment - 05/20/16 0001      Assessment   Medical Diagnosis Vertigo    Referring Provider [name]   Onset Date/Surgical Date 03/13/16   Next Vaughn Visit unknown    Prior Therapy not for this diagnosis.      Precautions   Precautions Fall     Restrictions   Weight Bearing Restrictions No     Balance Screen   Has the patient fallen in the past 6 months Yes   How many times? --  at least twice a day. States this is due to her back    Has the patient had a decrease in activity level because of a fear of falling?  Yes   Is the patient reluctant to leave their home because of a fear of falling?  Yes     Prior Function   Level of Independence Independent   Vocation On disability    Leisure none      Cognition   Overall Cognitive Status Within Functional Limits for tasks assessed     Functional Tests   Functional tests Single leg stance     Single Leg Stance   Comments Left 1", Right 9"            Vestibular Assessment - 05/20/16 0001      Symptom Behavior   Type of Dizziness "World moves"   Frequency of Dizziness twice a day    Duration of Dizziness a couple of minutes    Aggravating Factors Spontaneous onset   Relieving Factors Head stationary     Occulomotor Exam   Occulomotor Alignment Normal   Head shaking Horizontal Comment  horizontal   Smooth Pursuits Saccades  Both right greater than left  vertical, horizontal, diagonal   Saccades Poor trajectory     Vestibulo-Occular Reflex   VOR 1 Head Only (x 1 viewing) positive    VOR 2 Head and Object (x 2 viewing) positive      Positional Testing   Dix-Hallpike Dix-Hallpike Right;Dix-Hallpike Left     Dix-Hallpike Right   Dix-Hallpike Right Symptoms Other (comment)  horizontal      Dix-Hallpike Left   Dix-Hallpike Left Symptoms Upbeat, right rotatory nystagmus                Vestibular Treatment/Exercise - 05/20/16 0001      Vestibular Treatment/Exercise   Vestibular Treatment Provided Canalith Repositioning   Canalith Repositioning Epley Manuever Left   Gaze Exercises Eye/Head Exercise Horizontal;Eye/Head Exercise Vertical      EPLEY MANUEVER LEFT   Number of Reps  1   Overall Response  No change     Eye/Head Exercise Horizontal   Foot Position seated   Reps 10   Comments increased sx     Eye/Head Exercise Vertical   Foot Position seated   Reps 10   Comments increased symptoms                PT Education - 05/20/16 1513    Education provided Yes   Education Details  HEP for eye motion as well as saccades.    Person(s) Educated Patient   Methods Explanation   Comprehension Verbalized understanding          PT Short Term Goals - 05/20/16 1626       PT SHORT TERM GOAL #1  Title Pt to be falling one time a day   Time 2   Period Weeks   Status New     PT SHORT TERM GOAL #2   Title Pt to state that her dizziness has decreased 50%   Time 2   Period Weeks   Status New     PT SHORT TERM GOAL #3   Title Pt to be able to tolerate reading for 15 minutes    Time 2   Period Weeks   Status New           PT Long Term Goals - 05/20/16 1627      PT LONG TERM GOAL #1   Title Pt to state that she has not fallen in the past four days    Time 4   Period Weeks   Status New     PT LONG TERM GOAL #2   Title Pt to state that her dizziness has improved 75%   Time 4   Period Weeks   Status New     PT LONG TERM GOAL #3   Title Pt to be able to tolerate reading for thirty minutes at a time.   Time 4   Period Weeks   Status New               Plan - 05/20/16 1515    Clinical Impression Statement Ms. Haxelwood is a 66 yo female who has had 2 back surgeries, Lt foot fracture, MI who has been experiencing vertigo for the past two months.  She states that she is falling at least twice a day.  She has been referred to skilled physical therapy to address these issues.  Examination demonstrates a positive right hal-pike manuever with inital upward rotational that progressed into horizontal beating as well as decreased balance.  Ms. Mcafee will benefit from skilled physical therapy for Eply's manuever for Rt posterior canal as well as balance activity.     Rehab Potential Good   PT Frequency 2x / week   PT Duration 4 weeks   PT Treatment/Interventions ADLs/Self Care Home Management;Manual techniques;Patient/family education   PT Next Visit Plan Complete Halpike dix to the right to see if it remains positive.  If it remains positive complete two Eply's manuever and Hal-pike again.  Repeat if needed once Hal pike is (-) begin balance and habituation activity for rolling, sit to supine    PT Home Exercise Plan eye motin and VOR 1        Patient will benefit from skilled therapeutic intervention in order to improve the following deficits and impairments:  Decreased activity tolerance, Dizziness, Decreased balance  Visit Diagnosis: BPPV (benign paroxysmal positional vertigo), right - Plan: PT plan of care cert/re-cert  Unsteadiness on feet - Plan: PT plan of care cert/re-cert  Repeated falls - Plan: PT plan of care cert/re-cert      G-Codes - 29/52/84 1629    Functional Limitation Changing and maintaining body position   Changing and Maintaining Body Position Current Status (X3244) At least 80 percent but less than 100 percent impaired, limited or restricted   Changing and Maintaining Body Position Goal Status (W1027) At least 40 percent but less than 60 percent impaired, limited or restricted       Problem List Patient Active Problem List   Diagnosis Date Noted  . MDD (major depressive disorder), recurrent severe, without psychosis ([place]) 01/17/2016  . Anorexia nervosa 10/10/2015  . Loss of weight 10/10/2015  . Family  hx of colon cancer 11/08/2013  . Medication management 11/08/2013  . DM2 (diabetes mellitus, type 2) ([place]) 01/28/2013  . Cardiomyopathy, ischemic- EF 40% at cath 01/24/2013  . Dyslipidemia 01/24/2013  . STEMI 01/22/13 01/22/2013  . Tobacco abuse 01/22/2013  . S/P urgent CABG x 4 01/22/13 01/22/2013    [name], PT CLT [phone] 05/20/2016, 4:33 PM  [place] [address] [place], [place], [address] Phone: [phone]   Fax:  [phone]  Name: TANYA CROTHERS MRN: [id number] Date of Birth: [birth year]

## 2016-05-28 ENCOUNTER — Ambulatory Visit (HOSPITAL_COMMUNITY): Payer: Medicare HMO | Attending: Neurology | Admitting: Physical Therapy

## 2016-05-28 DIAGNOSIS — R2681 Unsteadiness on feet: Secondary | ICD-10-CM | POA: Insufficient documentation

## 2016-05-28 DIAGNOSIS — R296 Repeated falls: Secondary | ICD-10-CM

## 2016-05-28 DIAGNOSIS — H8111 Benign paroxysmal vertigo, right ear: Secondary | ICD-10-CM | POA: Insufficient documentation

## 2016-05-28 NOTE — Therapy (Signed)
[facility] [address] [place], [place], [address] Phone: [phone]   Fax:  [phone]  Physical Therapy Treatment  Patient Details  Name: [name] MRN: [id number] Date of Birth: 08-05-1950 Referring Provider: Phillips Odor  Encounter Date: 05/28/2016      PT End of Session - 05/28/16 1600    Visit Number 2   Number of Visits 8   Date for PT Re-Evaluation 06/19/16   Authorization Type Aetna medicare   Authorization - Visit Number 2   Authorization - Number of Visits 8   PT Start Time 1518   PT Stop Time 1559   PT Time Calculation (min) 41 min   Activity Tolerance Patient tolerated treatment well   Behavior During Therapy [facility] for tasks assessed/performed      Past Medical History:  Diagnosis Date  . Anxiety   . Arthritis   . Chronic back pain   . Complication of anesthesia   . Depression   . Depression   . Hypertension   . Myocardial infarction    x2.  [name] PONV (postoperative nausea and vomiting)     Past Surgical History:  Procedure Laterality Date  . ABDOMINAL HYSTERECTOMY    . APPENDECTOMY    . BACK SURGERY     x3; fusion with pins and rods  . BREAST LUMPECTOMY Left   . CHOLECYSTECTOMY    . COLONOSCOPY N/A 11/18/2013   Procedure: COLONOSCOPY;  Surgeon: Rogene Houston, MD;  [facility];  Service: Endoscopy;  Laterality: N/A;  1200  . CORONARY ARTERY BYPASS GRAFT N/A 01/22/2013   Procedure: Coronary Artery Bypass Grafting Times Four Using Left Internal Mammary Artery and Right Saphenous Leg Vein Harvested Endoscopically;  Surgeon: Grace Isaac, MD;  Location: [place];  Service: Open Heart Surgery;  Laterality: N/A;  . ESOPHAGOGASTRODUODENOSCOPY (EGD) WITH PROPOFOL N/A 11/23/2015   Procedure: ESOPHAGOGASTRODUODENOSCOPY (EGD) WITH PROPOFOL;  Surgeon: Rogene Houston, MD;  [facility];  Service: Endoscopy;  Laterality: N/A;  12:35  . EXPLORATORY LAPAROTOMY    . LEFT HEART  CATHETERIZATION WITH CORONARY ANGIOGRAM  01/22/2013   Procedure: LEFT HEART CATHETERIZATION WITH CORONARY ANGIOGRAM;  Surgeon: Lorretta Harp, MD;  Location: [facility] CATH LAB;  Service: Cardiovascular;;  . ORIF ANKLE FRACTURE Left   . SPINAL FUSION     x 2  . TONSILLECTOMY     age 89  . TUBAL LIGATION      There were no vitals filed for this visit.      Subjective Assessment - 05/28/16 1531    Subjective PT states that she is doing alright.  She has not been doing her exercises due to the fact that she was committed.  She has not fallen since she was here last so she is happy.    Pertinent History anxiety, spinalfusion, depression HTN, CAD, MI   How long can you sit comfortably? no problem   How long can you stand comfortably? not long due to back surgeries    How long can you walk comfortably? not long due to back and heart issues.    Patient Stated Goals less dizziness    Pain Onset More than a month ago                Vestibular Assessment - 05/28/16 0001      Positional Testing   Dix-Hallpike Dix-Hallpike Right;Dix-Hallpike Left     Dix-Hallpike Right   Dix-Hallpike Right Symptoms No nystagmus  Dix-Hallpike Left   Dix-Hallpike Left Symptoms No nystagmus                      Balance Exercises - 05/28/16 1531      Balance Exercises: Standing   Standing Eyes Opened Narrow base of support (BOS);Other reps (comment)  10 head turns    Tandem Stance Eyes open;2 reps;30 secs   SLS Eyes open;5 reps   Balance Beam forward and retro x 2 reps    Sidestepping 2 reps   Marching Limitations x5     sit to stand x 10 reps         PT Short Term Goals - 05/20/16 1626      PT SHORT TERM GOAL #1   Title Pt to be falling one time a day   Time 2   Period Weeks   Status New     PT SHORT TERM GOAL #2   Title Pt to state that her dizziness has decreased 50%   Time 2   Period Weeks   Status New     PT SHORT TERM GOAL #3   Title Pt to be able to  tolerate reading for 15 minutes    Time 2   Period Weeks   Status New           PT Long Term Goals - 05/20/16 1627      PT LONG TERM GOAL #1   Title Pt to state that she has not fallen in the past four days    Time 4   Period Weeks   Status New     PT LONG TERM GOAL #2   Title Pt to state that her dizziness has improved 75%   Time 4   Period Weeks   Status New     PT LONG TERM GOAL #3   Title Pt to be able to tolerate reading for thirty minutes at a time.   Time 4   Period Weeks   Status New               Plan - 05/28/16 1601    Clinical Impression Statement Ms. Butsch's evaluation and goals were reviewed with pt with no questions.  Pt Hal-Pike Dix were (-) therefore session focused on balance.    Rehab Potential Good   PT Frequency 2x / week   PT Duration 4 weeks   PT Treatment/Interventions ADLs/Self Care Home Management;Manual techniques;Patient/family education   PT Next Visit Plan recheck Hal-Pike dix  one last time.  Begin rocker board and closing eyes with narrow base of support    PT Home Exercise Plan eye motin and VOR 1       Patient will benefit from skilled therapeutic intervention in order to improve the following deficits and impairments:  Decreased activity tolerance, Dizziness, Decreased balance  Visit Diagnosis: BPPV (benign paroxysmal positional vertigo), right  Unsteadiness on feet  Repeated falls     Problem List Patient Active Problem List   Diagnosis Date Noted  . MDD (major depressive disorder), recurrent severe, without psychosis ([place]) 01/17/2016  . Anorexia nervosa 10/10/2015  . Loss of weight 10/10/2015  . Family hx of colon cancer 11/08/2013  . Medication management 11/08/2013  . DM2 (diabetes mellitus, type 2) ([place]) 01/28/2013  . Cardiomyopathy, ischemic- EF 40% at cath 01/24/2013  . Dyslipidemia 01/24/2013  . STEMI 01/22/13 01/22/2013  . Tobacco abuse 01/22/2013  . S/P urgent CABG x 4 01/22/13 01/22/2013  [name], PT CLT [phone] 05/28/2016, 4:03 PM  [place] [address] [place], [place], [address] Phone: [phone]   Fax:  [phone]  Name: [name] MRN: [id number] Date of Birth: Oct 13, 1950

## 2016-05-29 ENCOUNTER — Telehealth (HOSPITAL_COMMUNITY): Payer: Self-pay | Admitting: Physical Therapy

## 2016-05-29 DIAGNOSIS — Z6829 Body mass index (BMI) 29.0-29.9, adult: Secondary | ICD-10-CM | POA: Diagnosis not present

## 2016-05-29 DIAGNOSIS — I10 Essential (primary) hypertension: Secondary | ICD-10-CM | POA: Diagnosis not present

## 2016-05-29 DIAGNOSIS — Z299 Encounter for prophylactic measures, unspecified: Secondary | ICD-10-CM | POA: Diagnosis not present

## 2016-05-29 DIAGNOSIS — R69 Illness, unspecified: Secondary | ICD-10-CM | POA: Diagnosis not present

## 2016-05-29 DIAGNOSIS — J449 Chronic obstructive pulmonary disease, unspecified: Secondary | ICD-10-CM | POA: Diagnosis not present

## 2016-05-29 NOTE — Telephone Encounter (Signed)
Pt cx due to expense and she doesn't do mornings

## 2016-05-30 ENCOUNTER — Ambulatory Visit (HOSPITAL_COMMUNITY): Payer: Medicare HMO | Admitting: Physical Therapy

## 2016-06-02 ENCOUNTER — Ambulatory Visit (HOSPITAL_COMMUNITY): Payer: Medicare HMO | Admitting: Physical Therapy

## 2016-06-02 DIAGNOSIS — H8111 Benign paroxysmal vertigo, right ear: Secondary | ICD-10-CM

## 2016-06-02 DIAGNOSIS — R296 Repeated falls: Secondary | ICD-10-CM

## 2016-06-02 DIAGNOSIS — R2681 Unsteadiness on feet: Secondary | ICD-10-CM

## 2016-06-02 NOTE — Therapy (Addendum)
[place], [place], [address] Phone: [phone]   Fax:  [phone]  Physical Therapy Treatment  Patient Details  Name: [name] MRN: [id number] Date of Birth: [birth year] Referring Provider: Phillips Odor  Encounter Date: 06/02/2016      PT End of Session - 06/02/16 1435    Visit Number 3   Number of Visits 3   Date for PT Re-Evaluation 06/19/16   Authorization Type [employer]na medicare   Authorization - Visit Number 3   Authorization - Number of Visits 3   PT Start Time 1350   PT Stop Time 1435   PT Time Calculation (min) 45 min   Activity Tolerance Patient tolerated treatment well   Behavior During Therapy [facility] for tasks assessed/performed      Past Medical History:  Diagnosis Date  . Anxiety   . Arthritis   . Chronic back pain   . Complication of anesthesia   . Depression   . Depression   . Hypertension   . Myocardial infarction    x2.  [name] PONV (postoperative nausea and vomiting)     Past Surgical History:  Procedure Laterality Date  . ABDOMINAL HYSTERECTOMY    . APPENDECTOMY    . BACK SURGERY     x3; fusion with pins and rods  . BREAST LUMPECTOMY Left   . CHOLECYSTECTOMY    . COLONOSCOPY N/A 11/18/2013   Procedure: COLONOSCOPY;  Surgeon: Rogene Houston, MD;  [facility];  Service: Endoscopy;  Laterality: N/A;  1200  . CORONARY ARTERY BYPASS GRAFT N/A 01/22/2013   Procedure: Coronary Artery Bypass Grafting Times Four Using Left Internal Mammary Artery and Right Saphenous Leg Vein Harvested Endoscopically;  Surgeon: Grace Isaac, MD;  Location: [place];  Service: Open Heart Surgery;  Laterality: N/A;  . ESOPHAGOGASTRODUODENOSCOPY (EGD) WITH PROPOFOL N/A 11/23/2015   Procedure: ESOPHAGOGASTRODUODENOSCOPY (EGD) WITH PROPOFOL;  Surgeon: Rogene Houston, MD;  [facility];  Service: Endoscopy;  Laterality: N/A;  12:35  . EXPLORATORY LAPAROTOMY    . LEFT HEART  CATHETERIZATION WITH CORONARY ANGIOGRAM  01/22/2013   Procedure: LEFT HEART CATHETERIZATION WITH CORONARY ANGIOGRAM;  Surgeon: Lorretta Harp, MD;  Location: [facility] CATH LAB;  Service: Cardiovascular;;  . ORIF ANKLE FRACTURE Left   . SPINAL FUSION     x 2  . TONSILLECTOMY     age 66  . TUBAL LIGATION      There were no vitals filed for this visit.      Subjective Assessment - 06/02/16 1404    Subjective PT states that she was very sore after last treatment.     Pertinent History anxiety, spinalfusion, depression HTN, CAD, MI   How long can you sit comfortably? no problem   How long can you stand comfortably? not long due to back surgeries    How long can you walk comfortably? not long due to back and heart issues.    Patient Stated Goals less dizziness    Currently in Pain? Yes   Pain Score 4    Pain Location Back   Pain Orientation Lower   Pain Descriptors / Indicators Aching   Pain Type Chronic pain  not seeing pt for this problem   Pain Onset More than a month ago                Vestibular Assessment - 06/02/16 0001      Positional Testing   Dix-Hallpike  Dix-Hallpike Right;Dix-Hallpike Left     Dix-Hallpike Right   Dix-Hallpike Right Symptoms Downbeat, left rotatory nystagmus     Dix-Hallpike Left   Dix-Hallpike Left Symptoms Other (comment)  horizontal                   Vestibular Treatment/Exercise - 06/02/16 0001      Vestibular Treatment/Exercise   Vestibular Treatment Provided Canalith Repositioning   Canalith Repositioning Epley Manuever Left      EPLEY MANUEVER LEFT   Number of Reps  2   Overall Response  Improved Symptoms            Balance Exercises - 06/02/16 1405      Balance Exercises: Standing   Standing Eyes Opened Narrow base of support (BOS);Head turns;Foam/compliant surface;5 reps   Tandem Stance Eyes open;Foam/compliant surface;2 reps;30 secs   SLS with Vectors Solid surface;3 reps  5 seconds   Balance Beam  forward and retro x 2 reps    Retro Gait Foam/compliant surface;1 rep   Sidestepping 2 reps   Marching Limitations x5             PT Short Term Goals - 06/02/16 1436      PT SHORT TERM GOAL #1   Title Pt to be falling one time a day   Time 2   Period Weeks   Status On-going     PT SHORT TERM GOAL #2   Title Pt to state that her dizziness has decreased 50%   Time 2   Period Weeks   Status On-going     PT SHORT TERM GOAL #3   Title Pt to be able to tolerate reading for 15 minutes    Time 2   Period Weeks   Status On-going           PT Long Term Goals - 06/02/16 1437      PT LONG TERM GOAL #1   Title Pt to state that she has not fallen in the past four days    Time 4   Period Weeks   Status On-going     PT LONG TERM GOAL #2   Title Pt to state that her dizziness has improved 75%   Time 4   Period Weeks   Status On-going     PT LONG TERM GOAL #3   Title Pt to be able to tolerate reading for thirty minutes at a time.   Time 4   Period Weeks   Status On-going               Plan - 06/02/16 1435    Clinical Impression Statement Halpike Dix manuever positive therefore Eply's manuever completed twice.  Pt then worked on balance activity to reduce risk of falls.    Rehab Potential Good   PT Frequency 2x / week   PT Duration 4 weeks   PT Treatment/Interventions ADLs/Self Care Home Management;Manual techniques;Patient/family education   PT Next Visit Plan recheck Hal-Pike dix .  Add cone taps    PT Home Exercise Plan eye motin and VOR 1       G8982  CK G8983  CK  Patient will benefit from skilled therapeutic intervention in order to improve the following deficits and impairments:  Decreased activity tolerance, Dizziness, Decreased balance  Visit Diagnosis: BPPV (benign paroxysmal positional vertigo), right  Unsteadiness on feet  Repeated falls     Problem List Patient Active Problem List   Diagnosis Date Noted  .  MDD (major depressive  disorder), recurrent severe, without psychosis ([place]) 01/17/2016  . Anorexia nervosa 10/10/2015  . Loss of weight 10/10/2015  . Family hx of colon cancer 11/08/2013  . Medication management 11/08/2013  . DM2 (diabetes mellitus, type 2) ([place]) 01/28/2013  . Cardiomyopathy, ischemic- EF 40% at cath 01/24/2013  . Dyslipidemia 01/24/2013  . STEMI 01/22/13 01/22/2013  . Tobacco abuse 01/22/2013  . S/P urgent CABG x 4 01/22/13 01/22/2013   [name], PT CLT [phone] 06/02/2016, 2:38 PM  [name] [address] [place], [place], [address] Phone: [phone]   Fax:  [phone]  Name: [name] MRN: [id number] Date of Birth: 1950-08-06  PHYSICAL THERAPY DISCHARGE SUMMARY  Visits from Start of Care: 3  Current functional level related to goals / functional outcomes: See above    Remaining deficits: See above   Education / Equipment: HEP Plan: Patient agrees to discharge.  Patient goals were partially met. Patient is being discharged due to financial reasons.  ?????        [name], [place] CLT [phone]

## 2016-06-03 ENCOUNTER — Telehealth (HOSPITAL_COMMUNITY): Payer: Self-pay | Admitting: Internal Medicine

## 2016-06-03 DIAGNOSIS — M5136 Other intervertebral disc degeneration, lumbar region: Secondary | ICD-10-CM | POA: Diagnosis not present

## 2016-06-03 DIAGNOSIS — Z79891 Long term (current) use of opiate analgesic: Secondary | ICD-10-CM | POA: Diagnosis not present

## 2016-06-03 DIAGNOSIS — G8929 Other chronic pain: Secondary | ICD-10-CM | POA: Diagnosis not present

## 2016-06-03 DIAGNOSIS — R69 Illness, unspecified: Secondary | ICD-10-CM | POA: Diagnosis not present

## 2016-06-03 NOTE — Telephone Encounter (Signed)
06/03/16  Pt called and asked that all appointments be cancelled because she can't afford the copay.

## 2016-06-04 ENCOUNTER — Ambulatory Visit (HOSPITAL_COMMUNITY): Payer: Medicare HMO | Admitting: Physical Therapy

## 2016-06-09 ENCOUNTER — Encounter (HOSPITAL_COMMUNITY): Payer: Self-pay | Admitting: Physical Therapy

## 2016-06-11 ENCOUNTER — Encounter (HOSPITAL_COMMUNITY): Payer: Self-pay | Admitting: Physical Therapy

## 2016-06-13 DIAGNOSIS — Z6822 Body mass index (BMI) 22.0-22.9, adult: Secondary | ICD-10-CM | POA: Diagnosis not present

## 2016-06-13 DIAGNOSIS — R69 Illness, unspecified: Secondary | ICD-10-CM | POA: Diagnosis not present

## 2016-06-13 DIAGNOSIS — E039 Hypothyroidism, unspecified: Secondary | ICD-10-CM | POA: Diagnosis not present

## 2016-06-13 DIAGNOSIS — I2510 Atherosclerotic heart disease of native coronary artery without angina pectoris: Secondary | ICD-10-CM | POA: Diagnosis not present

## 2016-06-13 DIAGNOSIS — J449 Chronic obstructive pulmonary disease, unspecified: Secondary | ICD-10-CM | POA: Diagnosis not present

## 2016-06-13 DIAGNOSIS — Z299 Encounter for prophylactic measures, unspecified: Secondary | ICD-10-CM | POA: Diagnosis not present

## 2016-06-13 DIAGNOSIS — E7800 Pure hypercholesterolemia, unspecified: Secondary | ICD-10-CM | POA: Diagnosis not present

## 2016-06-13 DIAGNOSIS — I10 Essential (primary) hypertension: Secondary | ICD-10-CM | POA: Diagnosis not present

## 2016-06-16 ENCOUNTER — Encounter (HOSPITAL_COMMUNITY): Payer: Self-pay | Admitting: Physical Therapy

## 2016-06-19 ENCOUNTER — Encounter (HOSPITAL_COMMUNITY): Payer: Self-pay | Admitting: Physical Therapy

## 2016-06-19 DIAGNOSIS — I10 Essential (primary) hypertension: Secondary | ICD-10-CM | POA: Diagnosis not present

## 2016-06-19 DIAGNOSIS — J449 Chronic obstructive pulmonary disease, unspecified: Secondary | ICD-10-CM | POA: Diagnosis not present

## 2016-06-19 DIAGNOSIS — E7800 Pure hypercholesterolemia, unspecified: Secondary | ICD-10-CM | POA: Diagnosis not present

## 2016-06-19 DIAGNOSIS — I2510 Atherosclerotic heart disease of native coronary artery without angina pectoris: Secondary | ICD-10-CM | POA: Diagnosis not present

## 2016-06-24 ENCOUNTER — Encounter (HOSPITAL_COMMUNITY): Payer: Self-pay | Admitting: Physical Therapy

## 2016-06-26 ENCOUNTER — Encounter (HOSPITAL_COMMUNITY): Payer: Self-pay | Admitting: Physical Therapy

## 2016-06-30 ENCOUNTER — Encounter: Payer: Self-pay | Admitting: Cardiology

## 2016-06-30 ENCOUNTER — Ambulatory Visit (INDEPENDENT_AMBULATORY_CARE_PROVIDER_SITE_OTHER): Payer: Medicare HMO | Admitting: Cardiology

## 2016-06-30 VITALS — BP 146/71 | HR 63 | Ht 64.0 in | Wt 127.8 lb

## 2016-06-30 DIAGNOSIS — I2510 Atherosclerotic heart disease of native coronary artery without angina pectoris: Secondary | ICD-10-CM | POA: Diagnosis not present

## 2016-06-30 DIAGNOSIS — I10 Essential (primary) hypertension: Secondary | ICD-10-CM | POA: Diagnosis not present

## 2016-06-30 DIAGNOSIS — I6523 Occlusion and stenosis of bilateral carotid arteries: Secondary | ICD-10-CM | POA: Diagnosis not present

## 2016-06-30 DIAGNOSIS — E782 Mixed hyperlipidemia: Secondary | ICD-10-CM | POA: Diagnosis not present

## 2016-06-30 NOTE — Patient Instructions (Signed)

## 2016-06-30 NOTE — Progress Notes (Signed)
Clinical Summary Ms. Cid is a 66 y.o.female seen today for follow up of the following medical problems.   1. CAD  - prior STEMI 12/2012, found to have severe multivessel disease on cath. Had emergent CABG 4 vessel (LIMA-LAD, SVG to ramus, SVG to LCX, SVG to RCA. LVEF 40% by LV gram  - repeat echo 04/2013 showed normalized LVEF, 55-60%   - no recent chest pan. No new SOB/DOE - compliant with meds  2. Hyperlipidemia  10/2014: TC 329 TG 280 HDL 41 LDL 232 - Prior side effects on crestor, tolerating atorvastatin well.  -poor dietary habits, eats a lot of sweets - 04/2015 TC 273 TG 432 HDL 32 LDL was not to be calculated  3. HTN -compliant with meds.   4. COPD - PFTs 10/2014 with severe COPD - she has had difficultly affording her inhalers   5. Carotid stenosis - [place] 10/2014 with bilateral ICA 1-39% disease.   6. Blood in stool - followed by GI.   7. Depression - recent inpatient admission for depression   Past Medical History:  Diagnosis Date  . Anxiety   . Arthritis   . Chronic back pain   . Complication of anesthesia   . Depression   . Depression   . Hypertension   . Myocardial infarction ([place])    x2.  [name] PONV (postoperative nausea and vomiting)      Allergies  Allergen Reactions  . Black Pepper [Piper] Hives, Itching and Rash  . Other Hives, Itching, Rash and Other (See Comments)    METALS  . Tape Hives, Itching and Rash     Current Outpatient Prescriptions  Medication Sig Dispense Refill  . albuterol (PROVENTIL HFA;VENTOLIN HFA) 108 (90 Base) MCG/ACT inhaler Inhale 2 puffs into the lungs every 6 (six) hours as needed for wheezing or shortness of breath.    Jearl Klinefelter ELLIPTA 62.5-25 MCG/INH AEPB Inhale 1 puff into the lungs daily.     [name] aspirin EC 81 MG tablet Take 81 mg by mouth daily.    [name] atorvastatin (LIPITOR) 80 MG tablet Take 1 tablet (80 mg total) by mouth daily. (Patient taking differently: Take 80 mg by mouth every morning. )  90 tablet 3  . carvedilol (COREG) 3.125 MG tablet Take 1 tablet (3.125 mg total) by mouth 2 (two) times daily. 60 tablet 6  . diazepam (VALIUM) 5 MG tablet Take 5 mg by mouth 4 (four) times daily.     . ergocalciferol (VITAMIN D2) 50000 units capsule Take 50,000 Units by mouth every Wednesday.     [name] FLUoxetine (PROZAC) 40 MG capsule Take 40 mg by mouth daily.    [name] HYDROcodone-acetaminophen (NORCO) 7.5-325 MG tablet Take 1 tablet by mouth 4 (four) times daily.    [name] levofloxacin (LEVAQUIN) 500 MG tablet Take 500 mg by mouth daily. 10 day course starting on 01/09/2016    . lisinopril (PRINIVIL,ZESTRIL) 2.5 MG tablet TAKE ONE TABLET BY MOUTH ONCE DAILY 90 tablet 0  . predniSONE (DELTASONE) 10 MG tablet Take 10 mg by mouth daily. 10 day course starting on 01/09/16    . venlafaxine XR (EFFEXOR-XR) 75 MG 24 hr capsule Take 75 mg by mouth at bedtime.      No current facility-administered medications for this visit.      Past Surgical History:  Procedure Laterality Date  . ABDOMINAL HYSTERECTOMY    . APPENDECTOMY    . BACK SURGERY     x3; fusion with pins and  rods  . BREAST LUMPECTOMY Left   . CHOLECYSTECTOMY    . COLONOSCOPY N/A 11/18/2013   Procedure: COLONOSCOPY;  Surgeon: Rogene Houston, MD;  [facility];  Service: Endoscopy;  Laterality: N/A;  1200  . CORONARY ARTERY BYPASS GRAFT N/A 01/22/2013   Procedure: Coronary Artery Bypass Grafting Times Four Using Left Internal Mammary Artery and Right Saphenous Leg Vein Harvested Endoscopically;  Surgeon: Grace Isaac, MD;  Location: [place];  Service: Open Heart Surgery;  Laterality: N/A;  . ESOPHAGOGASTRODUODENOSCOPY (EGD) WITH PROPOFOL N/A 11/23/2015   Procedure: ESOPHAGOGASTRODUODENOSCOPY (EGD) WITH PROPOFOL;  Surgeon: Rogene Houston, MD;  [facility];  Service: Endoscopy;  Laterality: N/A;  12:35  . EXPLORATORY LAPAROTOMY    . LEFT HEART CATHETERIZATION WITH CORONARY ANGIOGRAM  01/22/2013   Procedure: LEFT HEART  CATHETERIZATION WITH CORONARY ANGIOGRAM;  Surgeon: Lorretta Harp, MD;  Location: [facility] CATH LAB;  Service: Cardiovascular;;  . ORIF ANKLE FRACTURE Left   . SPINAL FUSION     x 2  . TONSILLECTOMY     age 86  . TUBAL LIGATION       Allergies  Allergen Reactions  . Black Pepper [Piper] Hives, Itching and Rash  . Other Hives, Itching, Rash and Other (See Comments)    METALS  . Tape Hives, Itching and Rash      Family History  Problem Relation Age of Onset  . Alcohol abuse Father   . Schizophrenia Father   . Alcohol abuse Sister   . Alcohol abuse Brother   . Heart disease Unknown      Social History Ms. Kupper reports that she has been smoking Cigarettes and E-cigarettes.  She started smoking about 51 years ago. She has a 50.00 pack-year smoking history. She has never used smokeless tobacco. Ms. Underwood reports that she does not drink alcohol.   Review of Systems CONSTITUTIONAL: No weight loss, fever, chills, weakness or fatigue.  HEENT: Eyes: No visual loss, blurred vision, double vision or yellow sclerae.No hearing loss, sneezing, congestion, runny nose or sore throat.  SKIN: No rash or itching.  CARDIOVASCULAR: per hpi RESPIRATORY: No shortness of breath, cough or sputum.  GASTROINTESTINAL: No anorexia, nausea, vomiting or diarrhea. No abdominal pain or blood.  GENITOURINARY: No burning on urination, no polyuria NEUROLOGICAL: No headache, dizziness, syncope, paralysis, ataxia, numbness or tingling in the extremities. No change in bowel or bladder control.  MUSCULOSKELETAL: No muscle, back pain, joint pain or stiffness.  LYMPHATICS: No enlarged nodes. No history of splenectomy.  PSYCHIATRIC: No history of depression or anxiety.  ENDOCRINOLOGIC: No reports of sweating, cold or heat intolerance. No polyuria or polydipsia.  [name]   Physical Examination Vitals:   06/30/16 1301  BP: (!) 146/71  Pulse: 63   Vitals:   06/30/16 1301  Weight: 127 lb 12.8 oz (58 kg)    Height: 5\' 4"  (1.626 m)    Gen: resting comfortably, no acute distress HEENT: no scleral icterus, pupils equal round and reactive, no palptable cervical adenopathy,  CV: RRR, no m/r/g, no jvd Resp: Clear to auscultation bilaterally GI: abdomen is soft, non-tender, non-distended, normal bowel sounds, no hepatosplenomegaly MSK: extremities are warm, no edema.  Skin: warm, no rash Neuro:  no focal deficits Psych: appropriate affect   Diagnostic Studies 04/2013 Echo LVEF 65-68%, grade I diastolic dysfunction, mild MR  10/2014 PFTs: severe COPD    Assessment and Plan  1. CAD - no recent symptoms. EKG in clinic shows SR, no acute  ischemic changes - continue current meds  2. Hyperlipidemia  - improving her diet to help TGs - continue statin  3. HTN - at goal, continue current meds  4. Carotid stenosis - mild bilateral disease by [place] 01/2014, continue to monitor..   F/u 6 months. Labs from pcp       Arnoldo Lenis, M.D.

## 2016-07-03 ENCOUNTER — Encounter: Payer: Self-pay | Admitting: *Deleted

## 2016-07-14 DIAGNOSIS — Z6822 Body mass index (BMI) 22.0-22.9, adult: Secondary | ICD-10-CM | POA: Diagnosis not present

## 2016-07-14 DIAGNOSIS — E039 Hypothyroidism, unspecified: Secondary | ICD-10-CM | POA: Diagnosis not present

## 2016-07-14 DIAGNOSIS — J449 Chronic obstructive pulmonary disease, unspecified: Secondary | ICD-10-CM | POA: Diagnosis not present

## 2016-07-14 DIAGNOSIS — E7800 Pure hypercholesterolemia, unspecified: Secondary | ICD-10-CM | POA: Diagnosis not present

## 2016-07-14 DIAGNOSIS — Z713 Dietary counseling and surveillance: Secondary | ICD-10-CM | POA: Diagnosis not present

## 2016-07-14 DIAGNOSIS — I2510 Atherosclerotic heart disease of native coronary artery without angina pectoris: Secondary | ICD-10-CM | POA: Diagnosis not present

## 2016-07-14 DIAGNOSIS — Z299 Encounter for prophylactic measures, unspecified: Secondary | ICD-10-CM | POA: Diagnosis not present

## 2016-07-14 DIAGNOSIS — R69 Illness, unspecified: Secondary | ICD-10-CM | POA: Diagnosis not present

## 2016-07-14 DIAGNOSIS — I10 Essential (primary) hypertension: Secondary | ICD-10-CM | POA: Diagnosis not present

## 2016-07-29 DIAGNOSIS — M5136 Other intervertebral disc degeneration, lumbar region: Secondary | ICD-10-CM | POA: Diagnosis not present

## 2016-07-29 DIAGNOSIS — G8929 Other chronic pain: Secondary | ICD-10-CM | POA: Diagnosis not present

## 2016-08-13 DIAGNOSIS — I2510 Atherosclerotic heart disease of native coronary artery without angina pectoris: Secondary | ICD-10-CM | POA: Diagnosis not present

## 2016-08-13 DIAGNOSIS — E7800 Pure hypercholesterolemia, unspecified: Secondary | ICD-10-CM | POA: Diagnosis not present

## 2016-08-13 DIAGNOSIS — J449 Chronic obstructive pulmonary disease, unspecified: Secondary | ICD-10-CM | POA: Diagnosis not present

## 2016-08-13 DIAGNOSIS — I10 Essential (primary) hypertension: Secondary | ICD-10-CM | POA: Diagnosis not present

## 2016-08-14 DIAGNOSIS — J449 Chronic obstructive pulmonary disease, unspecified: Secondary | ICD-10-CM | POA: Diagnosis not present

## 2016-08-14 DIAGNOSIS — Z299 Encounter for prophylactic measures, unspecified: Secondary | ICD-10-CM | POA: Diagnosis not present

## 2016-08-14 DIAGNOSIS — I10 Essential (primary) hypertension: Secondary | ICD-10-CM | POA: Diagnosis not present

## 2016-08-14 DIAGNOSIS — M545 Low back pain: Secondary | ICD-10-CM | POA: Diagnosis not present

## 2016-08-14 DIAGNOSIS — E039 Hypothyroidism, unspecified: Secondary | ICD-10-CM | POA: Diagnosis not present

## 2016-08-14 DIAGNOSIS — Z6824 Body mass index (BMI) 24.0-24.9, adult: Secondary | ICD-10-CM | POA: Diagnosis not present

## 2016-08-14 DIAGNOSIS — R69 Illness, unspecified: Secondary | ICD-10-CM | POA: Diagnosis not present

## 2016-08-14 DIAGNOSIS — E7800 Pure hypercholesterolemia, unspecified: Secondary | ICD-10-CM | POA: Diagnosis not present

## 2016-08-14 DIAGNOSIS — I2510 Atherosclerotic heart disease of native coronary artery without angina pectoris: Secondary | ICD-10-CM | POA: Diagnosis not present

## 2016-09-08 DIAGNOSIS — I10 Essential (primary) hypertension: Secondary | ICD-10-CM | POA: Diagnosis not present

## 2016-09-08 DIAGNOSIS — J449 Chronic obstructive pulmonary disease, unspecified: Secondary | ICD-10-CM | POA: Diagnosis not present

## 2016-09-08 DIAGNOSIS — I2510 Atherosclerotic heart disease of native coronary artery without angina pectoris: Secondary | ICD-10-CM | POA: Diagnosis not present

## 2016-09-08 DIAGNOSIS — E7800 Pure hypercholesterolemia, unspecified: Secondary | ICD-10-CM | POA: Diagnosis not present

## 2016-09-23 DIAGNOSIS — Z1211 Encounter for screening for malignant neoplasm of colon: Secondary | ICD-10-CM | POA: Diagnosis not present

## 2016-09-23 DIAGNOSIS — Z79899 Other long term (current) drug therapy: Secondary | ICD-10-CM | POA: Diagnosis not present

## 2016-09-23 DIAGNOSIS — Z7189 Other specified counseling: Secondary | ICD-10-CM | POA: Diagnosis not present

## 2016-09-23 DIAGNOSIS — E7800 Pure hypercholesterolemia, unspecified: Secondary | ICD-10-CM | POA: Diagnosis not present

## 2016-09-23 DIAGNOSIS — Z1389 Encounter for screening for other disorder: Secondary | ICD-10-CM | POA: Diagnosis not present

## 2016-09-23 DIAGNOSIS — Z299 Encounter for prophylactic measures, unspecified: Secondary | ICD-10-CM | POA: Diagnosis not present

## 2016-09-23 DIAGNOSIS — J449 Chronic obstructive pulmonary disease, unspecified: Secondary | ICD-10-CM | POA: Diagnosis not present

## 2016-09-23 DIAGNOSIS — Z6824 Body mass index (BMI) 24.0-24.9, adult: Secondary | ICD-10-CM | POA: Diagnosis not present

## 2016-09-23 DIAGNOSIS — R69 Illness, unspecified: Secondary | ICD-10-CM | POA: Diagnosis not present

## 2016-09-23 DIAGNOSIS — I10 Essential (primary) hypertension: Secondary | ICD-10-CM | POA: Diagnosis not present

## 2016-09-23 DIAGNOSIS — E039 Hypothyroidism, unspecified: Secondary | ICD-10-CM | POA: Diagnosis not present

## 2016-09-23 DIAGNOSIS — Z0000 Encounter for general adult medical examination without abnormal findings: Secondary | ICD-10-CM | POA: Diagnosis not present

## 2016-09-24 DIAGNOSIS — M961 Postlaminectomy syndrome, not elsewhere classified: Secondary | ICD-10-CM | POA: Diagnosis not present

## 2016-09-24 DIAGNOSIS — I10 Essential (primary) hypertension: Secondary | ICD-10-CM | POA: Diagnosis not present

## 2016-09-24 DIAGNOSIS — R296 Repeated falls: Secondary | ICD-10-CM | POA: Diagnosis not present

## 2016-09-24 DIAGNOSIS — R69 Illness, unspecified: Secondary | ICD-10-CM | POA: Diagnosis not present

## 2016-09-24 DIAGNOSIS — Z79891 Long term (current) use of opiate analgesic: Secondary | ICD-10-CM | POA: Diagnosis not present

## 2016-09-24 DIAGNOSIS — M545 Low back pain: Secondary | ICD-10-CM | POA: Diagnosis not present

## 2016-09-24 DIAGNOSIS — I519 Heart disease, unspecified: Secondary | ICD-10-CM | POA: Diagnosis not present

## 2016-10-02 DIAGNOSIS — E7800 Pure hypercholesterolemia, unspecified: Secondary | ICD-10-CM | POA: Diagnosis not present

## 2016-10-02 DIAGNOSIS — R69 Illness, unspecified: Secondary | ICD-10-CM | POA: Diagnosis not present

## 2016-10-02 DIAGNOSIS — I2510 Atherosclerotic heart disease of native coronary artery without angina pectoris: Secondary | ICD-10-CM | POA: Diagnosis not present

## 2016-10-02 DIAGNOSIS — Z6824 Body mass index (BMI) 24.0-24.9, adult: Secondary | ICD-10-CM | POA: Diagnosis not present

## 2016-10-02 DIAGNOSIS — E039 Hypothyroidism, unspecified: Secondary | ICD-10-CM | POA: Diagnosis not present

## 2016-10-02 DIAGNOSIS — E1165 Type 2 diabetes mellitus with hyperglycemia: Secondary | ICD-10-CM | POA: Diagnosis not present

## 2016-10-02 DIAGNOSIS — J449 Chronic obstructive pulmonary disease, unspecified: Secondary | ICD-10-CM | POA: Diagnosis not present

## 2016-10-02 DIAGNOSIS — I10 Essential (primary) hypertension: Secondary | ICD-10-CM | POA: Diagnosis not present

## 2016-10-02 DIAGNOSIS — Z299 Encounter for prophylactic measures, unspecified: Secondary | ICD-10-CM | POA: Diagnosis not present

## 2016-10-02 DIAGNOSIS — Z713 Dietary counseling and surveillance: Secondary | ICD-10-CM | POA: Diagnosis not present

## 2016-10-09 ENCOUNTER — Ambulatory Visit (INDEPENDENT_AMBULATORY_CARE_PROVIDER_SITE_OTHER): Payer: Self-pay | Admitting: Internal Medicine

## 2016-10-09 ENCOUNTER — Other Ambulatory Visit: Payer: Self-pay | Admitting: Cardiology

## 2016-10-09 DIAGNOSIS — J449 Chronic obstructive pulmonary disease, unspecified: Secondary | ICD-10-CM | POA: Diagnosis not present

## 2016-10-09 DIAGNOSIS — I10 Essential (primary) hypertension: Secondary | ICD-10-CM | POA: Diagnosis not present

## 2016-10-09 DIAGNOSIS — E7800 Pure hypercholesterolemia, unspecified: Secondary | ICD-10-CM | POA: Diagnosis not present

## 2016-10-09 DIAGNOSIS — I2510 Atherosclerotic heart disease of native coronary artery without angina pectoris: Secondary | ICD-10-CM | POA: Diagnosis not present

## 2016-10-28 DIAGNOSIS — R69 Illness, unspecified: Secondary | ICD-10-CM | POA: Diagnosis not present

## 2016-10-28 DIAGNOSIS — M545 Low back pain: Secondary | ICD-10-CM | POA: Diagnosis not present

## 2016-10-28 DIAGNOSIS — R296 Repeated falls: Secondary | ICD-10-CM | POA: Diagnosis not present

## 2016-10-28 DIAGNOSIS — M961 Postlaminectomy syndrome, not elsewhere classified: Secondary | ICD-10-CM | POA: Diagnosis not present

## 2016-10-28 DIAGNOSIS — Z79891 Long term (current) use of opiate analgesic: Secondary | ICD-10-CM | POA: Diagnosis not present

## 2016-10-28 DIAGNOSIS — I10 Essential (primary) hypertension: Secondary | ICD-10-CM | POA: Diagnosis not present

## 2016-10-28 DIAGNOSIS — I951 Orthostatic hypotension: Secondary | ICD-10-CM | POA: Diagnosis not present

## 2016-11-26 DIAGNOSIS — R296 Repeated falls: Secondary | ICD-10-CM | POA: Diagnosis not present

## 2016-11-26 DIAGNOSIS — I10 Essential (primary) hypertension: Secondary | ICD-10-CM | POA: Diagnosis not present

## 2016-11-26 DIAGNOSIS — I951 Orthostatic hypotension: Secondary | ICD-10-CM | POA: Diagnosis not present

## 2016-11-26 DIAGNOSIS — M961 Postlaminectomy syndrome, not elsewhere classified: Secondary | ICD-10-CM | POA: Diagnosis not present

## 2016-11-26 DIAGNOSIS — M545 Low back pain: Secondary | ICD-10-CM | POA: Diagnosis not present

## 2016-12-12 DIAGNOSIS — J449 Chronic obstructive pulmonary disease, unspecified: Secondary | ICD-10-CM | POA: Diagnosis not present

## 2016-12-12 DIAGNOSIS — I10 Essential (primary) hypertension: Secondary | ICD-10-CM | POA: Diagnosis not present

## 2016-12-12 DIAGNOSIS — E7800 Pure hypercholesterolemia, unspecified: Secondary | ICD-10-CM | POA: Diagnosis not present

## 2016-12-12 DIAGNOSIS — I2510 Atherosclerotic heart disease of native coronary artery without angina pectoris: Secondary | ICD-10-CM | POA: Diagnosis not present

## 2016-12-15 DIAGNOSIS — R69 Illness, unspecified: Secondary | ICD-10-CM | POA: Diagnosis not present

## 2016-12-24 DIAGNOSIS — M961 Postlaminectomy syndrome, not elsewhere classified: Secondary | ICD-10-CM | POA: Diagnosis not present

## 2016-12-24 DIAGNOSIS — G894 Chronic pain syndrome: Secondary | ICD-10-CM | POA: Diagnosis not present

## 2016-12-24 DIAGNOSIS — I951 Orthostatic hypotension: Secondary | ICD-10-CM | POA: Diagnosis not present

## 2016-12-24 DIAGNOSIS — Z79891 Long term (current) use of opiate analgesic: Secondary | ICD-10-CM | POA: Diagnosis not present

## 2016-12-29 ENCOUNTER — Encounter: Payer: Self-pay | Admitting: Cardiology

## 2016-12-29 ENCOUNTER — Ambulatory Visit: Payer: Medicare HMO | Admitting: Cardiology

## 2016-12-29 ENCOUNTER — Other Ambulatory Visit: Payer: Self-pay

## 2016-12-29 ENCOUNTER — Encounter: Payer: Self-pay | Admitting: *Deleted

## 2016-12-29 VITALS — BP 138/78 | HR 66 | Ht 64.0 in | Wt 148.0 lb

## 2016-12-29 DIAGNOSIS — E782 Mixed hyperlipidemia: Secondary | ICD-10-CM | POA: Diagnosis not present

## 2016-12-29 DIAGNOSIS — I10 Essential (primary) hypertension: Secondary | ICD-10-CM

## 2016-12-29 DIAGNOSIS — I2510 Atherosclerotic heart disease of native coronary artery without angina pectoris: Secondary | ICD-10-CM | POA: Diagnosis not present

## 2016-12-29 NOTE — Patient Instructions (Signed)

## 2016-12-29 NOTE — Progress Notes (Signed)
Clinical Summary Ms. Tristan is a 66 y.o.female  seen today for follow up of the following medical problems.   1. CAD  - prior STEMI 12/2012, found to have severe multivessel disease on cath. Had emergent CABG 4 vessel (LIMA-LAD, SVG to ramus, SVG to LCX, SVG to RCA. LVEF 40% by LV gram  - repeat echo 04/2013 showed normalized LVEF, 55-60%    - no recent chest pain. Worsening SOB she attributes to continued smoking and her COPD - compliant with meds  2. Hyperlipidemia  10/2014: TC 329 TG 280 HDL 41 LDL 232 - Prior side effects on crestor, tolerating atorvastatin well.  -poor dietary habits, eats a lot of sweets - 04/2015 TC 273 TG 432 HDL 32 LDL was not to be calculated  - reports recent labs with pcp. Complinant with meds  3. HTN -she is compliant with meds  4. COPD - PFTs 10/2014 with severe COPD - she has had difficultly affording her inhalers   5. Carotid stenosis - [place] 10/2014 with bilateral ICA 1-39% disease.  - no recent neuro symptoms.   6. Blood in stool - followed by GI.   7. Depression - previous inpatient admission for depression    Past Medical History:  Diagnosis Date  . Anxiety   . Arthritis   . Chronic back pain   . Complication of anesthesia   . Depression   . Depression   . Hypertension   . Myocardial infarction ([place])    x2.  [name] PONV (postoperative nausea and vomiting)      Allergies  Allergen Reactions  . Black Pepper [Piper] Hives, Itching and Rash  . Other Hives, Itching, Rash and Other (See Comments)    METALS  . Tape Hives, Itching and Rash     Current Outpatient Medications  Medication Sig Dispense Refill  . albuterol (PROVENTIL HFA;VENTOLIN HFA) 108 (90 Base) MCG/ACT inhaler Inhale 2 puffs into the lungs every 6 (six) hours as needed for wheezing or shortness of breath.    Jearl Klinefelter ELLIPTA 62.5-25 MCG/INH AEPB Inhale 1 puff into the lungs daily.     . ARIPiprazole (ABILIFY PO) Take by mouth daily.    [name]  aspirin EC 81 MG tablet Take 81 mg by mouth daily.    [name] atorvastatin (LIPITOR) 80 MG tablet Take 80 mg by mouth daily.    . carvedilol (COREG) 3.125 MG tablet Take 1 tablet (3.125 mg total) by mouth 2 (two) times daily. 60 tablet 6  . HYDROcodone-acetaminophen (NORCO) 7.5-325 MG tablet Take 1 tablet by mouth 4 (four) times daily.    [name] lisinopril (PRINIVIL,ZESTRIL) 2.5 MG tablet TAKE 1 TABLET BY MOUTH ONCE DAILY 90 tablet 0  . pyridostigmine (MESTINON) 60 MG tablet     . traZODone (DESYREL) 50 MG tablet     . venlafaxine XR (EFFEXOR-XR) 75 MG 24 hr capsule Take 75 mg by mouth at bedtime.      No current facility-administered medications for this visit.      Past Surgical History:  Procedure Laterality Date  . ABDOMINAL HYSTERECTOMY    . APPENDECTOMY    . BACK SURGERY     x3; fusion with pins and rods  . BREAST LUMPECTOMY Left   . CHOLECYSTECTOMY    . COLONOSCOPY N/A 11/18/2013   Procedure: COLONOSCOPY;  Surgeon: Rogene Houston, MD;  [facility];  Service: Endoscopy;  Laterality: N/A;  1200  . CORONARY ARTERY BYPASS GRAFT N/A 01/22/2013   Procedure:  Coronary Artery Bypass Grafting Times Four Using Left Internal Mammary Artery and Right Saphenous Leg Vein Harvested Endoscopically;  Surgeon: Grace Isaac, MD;  Location: [place];  Service: Open Heart Surgery;  Laterality: N/A;  . ESOPHAGOGASTRODUODENOSCOPY (EGD) WITH PROPOFOL N/A 11/23/2015   Procedure: ESOPHAGOGASTRODUODENOSCOPY (EGD) WITH PROPOFOL;  Surgeon: Rogene Houston, MD;  [facility];  Service: Endoscopy;  Laterality: N/A;  12:35  . EXPLORATORY LAPAROTOMY    . LEFT HEART CATHETERIZATION WITH CORONARY ANGIOGRAM  01/22/2013   Procedure: LEFT HEART CATHETERIZATION WITH CORONARY ANGIOGRAM;  Surgeon: Lorretta Harp, MD;  Location[facility] C[facility] LAB;  Service: Cardiovascular;;  . ORIF ANKLE FRACTURE Left   . SPINAL FUSION     x 2  . TONSILLECTOMY     age 25  . TUBAL LIGATION       Allergies  Allergen  Reactions  . Black Pepper [Piper] Hives, Itching and Rash  . Other Hives, Itching, Rash and Other (See Comments)    METALS  . Tape Hives, Itching and Rash      Family History  Problem Relation Age of Onset  . Alcohol abuse Father   . Schizophrenia Father   . Alcohol abuse Sister   . Alcohol abuse Brother   . Heart disease Unknown      Social History Ms. Hensen reports that she has been smoking cigarettes and e-cigarettes.  She started smoking about 51 years ago. She has a 50.00 pack-year smoking history. she has never used smokeless tobacco. Ms. Antonucci reports that she does not drink alcohol.   Review of Systems CONSTITUTIONAL: No weight loss, fever, chills, weakness or fatigue.  HEENT: Eyes: No visual loss, blurred vision, double vision or yellow sclerae.No hearing loss, sneezing, congestion, runny nose or sore throat.  SKIN: No rash or itching.  CARDIOVASCULAR: per hpi RESPIRATORY: per hpi GASTROINTESTINAL: No anorexia, nausea, vomiting or diarrhea. No abdominal pain or blood.  GENITOURINARY: No burning on urination, no polyuria NEUROLOGICAL: No headache, dizziness, syncope, paralysis, ataxia, numbness or tingling in the extremities. No change in bowel or bladder control.  MUSCULOSKELETAL: No muscle, back pain, joint pain or stiffness.  LYMPHATICS: No enlarged nodes. No history of splenectomy.  PSYCHIATRIC: No history of depression or anxiety.  ENDOCRINOLOGIC: No reports of sweating, cold or heat intolerance. No polyuria or polydipsia.  [name]   Physical Examination Vitals:   12/29/16 1300  BP: 138/78  Pulse: 66  SpO2: 93%   Vitals:   12/29/16 1300  Weight: 148 lb (67.1 kg)  Height: 5\' 4"  (1.626 m)    Gen: resting comfortably, no acute distress HEENT: no scleral icterus, pupils equal round and reactive, no palptable cervical adenopathy,  CV: RRR, no m/r/g, no jvd. +bilateral bruits Resp: Clear to auscultation bilaterally GI: abdomen is soft, non-tender,  non-distended, normal bowel sounds, no hepatosplenomegaly MSK: extremities are warm, no edema.  Skin: warm, no rash Neuro:  no focal deficits Psych: appropriate affect   Diagnostic Studies 04/2013 Echo LVEF 01-75%, grade I diastolic dysfunction, mild MR  10/2014 PFTs: severe COPD     Assessment and Plan  1. CAD - asymptomatic, continue current meds  2. Hyperlipidemia  -continue statin, request labs from pcp  3. HTN - manual recheck bp 128/70 is at goal, continue current meds  4. Carotid stenosis - mild bilateral disease by [place] 01/2014 - continue to monitor   F/u 6 months.      Arnoldo Lenis, M.D.

## 2016-12-30 ENCOUNTER — Encounter: Payer: Self-pay | Admitting: Cardiology

## 2016-12-31 DIAGNOSIS — R69 Illness, unspecified: Secondary | ICD-10-CM | POA: Diagnosis not present

## 2017-01-06 DIAGNOSIS — Z6825 Body mass index (BMI) 25.0-25.9, adult: Secondary | ICD-10-CM | POA: Diagnosis not present

## 2017-01-06 DIAGNOSIS — R69 Illness, unspecified: Secondary | ICD-10-CM | POA: Diagnosis not present

## 2017-01-06 DIAGNOSIS — E1165 Type 2 diabetes mellitus with hyperglycemia: Secondary | ICD-10-CM | POA: Diagnosis not present

## 2017-01-06 DIAGNOSIS — Z299 Encounter for prophylactic measures, unspecified: Secondary | ICD-10-CM | POA: Diagnosis not present

## 2017-01-06 DIAGNOSIS — J449 Chronic obstructive pulmonary disease, unspecified: Secondary | ICD-10-CM | POA: Diagnosis not present

## 2017-01-09 DIAGNOSIS — I2510 Atherosclerotic heart disease of native coronary artery without angina pectoris: Secondary | ICD-10-CM | POA: Diagnosis not present

## 2017-01-09 DIAGNOSIS — J449 Chronic obstructive pulmonary disease, unspecified: Secondary | ICD-10-CM | POA: Diagnosis not present

## 2017-01-09 DIAGNOSIS — I10 Essential (primary) hypertension: Secondary | ICD-10-CM | POA: Diagnosis not present

## 2017-01-09 DIAGNOSIS — E7800 Pure hypercholesterolemia, unspecified: Secondary | ICD-10-CM | POA: Diagnosis not present

## 2017-01-22 DIAGNOSIS — Z79891 Long term (current) use of opiate analgesic: Secondary | ICD-10-CM | POA: Diagnosis not present

## 2017-01-22 DIAGNOSIS — I951 Orthostatic hypotension: Secondary | ICD-10-CM | POA: Diagnosis not present

## 2017-01-22 DIAGNOSIS — G894 Chronic pain syndrome: Secondary | ICD-10-CM | POA: Diagnosis not present

## 2017-01-22 DIAGNOSIS — M961 Postlaminectomy syndrome, not elsewhere classified: Secondary | ICD-10-CM | POA: Diagnosis not present

## 2017-02-10 DIAGNOSIS — E7800 Pure hypercholesterolemia, unspecified: Secondary | ICD-10-CM | POA: Diagnosis not present

## 2017-02-10 DIAGNOSIS — I2510 Atherosclerotic heart disease of native coronary artery without angina pectoris: Secondary | ICD-10-CM | POA: Diagnosis not present

## 2017-02-10 DIAGNOSIS — J449 Chronic obstructive pulmonary disease, unspecified: Secondary | ICD-10-CM | POA: Diagnosis not present

## 2017-02-10 DIAGNOSIS — I10 Essential (primary) hypertension: Secondary | ICD-10-CM | POA: Diagnosis not present

## 2017-02-17 DIAGNOSIS — G894 Chronic pain syndrome: Secondary | ICD-10-CM | POA: Diagnosis not present

## 2017-02-17 DIAGNOSIS — M961 Postlaminectomy syndrome, not elsewhere classified: Secondary | ICD-10-CM | POA: Diagnosis not present

## 2017-02-17 DIAGNOSIS — Z79891 Long term (current) use of opiate analgesic: Secondary | ICD-10-CM | POA: Diagnosis not present

## 2017-02-17 DIAGNOSIS — I951 Orthostatic hypotension: Secondary | ICD-10-CM | POA: Diagnosis not present

## 2017-04-01 DIAGNOSIS — I2510 Atherosclerotic heart disease of native coronary artery without angina pectoris: Secondary | ICD-10-CM | POA: Diagnosis not present

## 2017-04-01 DIAGNOSIS — E7800 Pure hypercholesterolemia, unspecified: Secondary | ICD-10-CM | POA: Diagnosis not present

## 2017-04-01 DIAGNOSIS — I10 Essential (primary) hypertension: Secondary | ICD-10-CM | POA: Diagnosis not present

## 2017-04-01 DIAGNOSIS — J449 Chronic obstructive pulmonary disease, unspecified: Secondary | ICD-10-CM | POA: Diagnosis not present

## 2017-04-15 DIAGNOSIS — Z79891 Long term (current) use of opiate analgesic: Secondary | ICD-10-CM | POA: Diagnosis not present

## 2017-04-15 DIAGNOSIS — G894 Chronic pain syndrome: Secondary | ICD-10-CM | POA: Diagnosis not present

## 2017-04-15 DIAGNOSIS — I951 Orthostatic hypotension: Secondary | ICD-10-CM | POA: Diagnosis not present

## 2017-04-15 DIAGNOSIS — M961 Postlaminectomy syndrome, not elsewhere classified: Secondary | ICD-10-CM | POA: Diagnosis not present

## 2017-04-23 DIAGNOSIS — R69 Illness, unspecified: Secondary | ICD-10-CM | POA: Diagnosis not present

## 2017-04-23 DIAGNOSIS — E1165 Type 2 diabetes mellitus with hyperglycemia: Secondary | ICD-10-CM | POA: Diagnosis not present

## 2017-04-23 DIAGNOSIS — Z6823 Body mass index (BMI) 23.0-23.9, adult: Secondary | ICD-10-CM | POA: Diagnosis not present

## 2017-04-23 DIAGNOSIS — J449 Chronic obstructive pulmonary disease, unspecified: Secondary | ICD-10-CM | POA: Diagnosis not present

## 2017-04-23 DIAGNOSIS — I10 Essential (primary) hypertension: Secondary | ICD-10-CM | POA: Diagnosis not present

## 2017-04-23 DIAGNOSIS — Z2821 Immunization not carried out because of patient refusal: Secondary | ICD-10-CM | POA: Diagnosis not present

## 2017-04-23 DIAGNOSIS — Z299 Encounter for prophylactic measures, unspecified: Secondary | ICD-10-CM | POA: Diagnosis not present

## 2017-05-08 DIAGNOSIS — J449 Chronic obstructive pulmonary disease, unspecified: Secondary | ICD-10-CM | POA: Diagnosis not present

## 2017-05-08 DIAGNOSIS — I2510 Atherosclerotic heart disease of native coronary artery without angina pectoris: Secondary | ICD-10-CM | POA: Diagnosis not present

## 2017-05-08 DIAGNOSIS — E7800 Pure hypercholesterolemia, unspecified: Secondary | ICD-10-CM | POA: Diagnosis not present

## 2017-05-08 DIAGNOSIS — I10 Essential (primary) hypertension: Secondary | ICD-10-CM | POA: Diagnosis not present

## 2017-06-02 DIAGNOSIS — J449 Chronic obstructive pulmonary disease, unspecified: Secondary | ICD-10-CM | POA: Diagnosis not present

## 2017-06-02 DIAGNOSIS — E7800 Pure hypercholesterolemia, unspecified: Secondary | ICD-10-CM | POA: Diagnosis not present

## 2017-06-02 DIAGNOSIS — I10 Essential (primary) hypertension: Secondary | ICD-10-CM | POA: Diagnosis not present

## 2017-06-02 DIAGNOSIS — I2510 Atherosclerotic heart disease of native coronary artery without angina pectoris: Secondary | ICD-10-CM | POA: Diagnosis not present

## 2017-06-10 DIAGNOSIS — M961 Postlaminectomy syndrome, not elsewhere classified: Secondary | ICD-10-CM | POA: Diagnosis not present

## 2017-06-10 DIAGNOSIS — I951 Orthostatic hypotension: Secondary | ICD-10-CM | POA: Diagnosis not present

## 2017-06-10 DIAGNOSIS — Z79891 Long term (current) use of opiate analgesic: Secondary | ICD-10-CM | POA: Diagnosis not present

## 2017-06-10 DIAGNOSIS — G894 Chronic pain syndrome: Secondary | ICD-10-CM | POA: Diagnosis not present

## 2017-06-25 DIAGNOSIS — Z0100 Encounter for examination of eyes and vision without abnormal findings: Secondary | ICD-10-CM | POA: Diagnosis not present

## 2017-06-25 DIAGNOSIS — E109 Type 1 diabetes mellitus without complications: Secondary | ICD-10-CM | POA: Diagnosis not present

## 2017-06-29 DIAGNOSIS — I2510 Atherosclerotic heart disease of native coronary artery without angina pectoris: Secondary | ICD-10-CM | POA: Diagnosis not present

## 2017-06-29 DIAGNOSIS — I10 Essential (primary) hypertension: Secondary | ICD-10-CM | POA: Diagnosis not present

## 2017-06-29 DIAGNOSIS — J449 Chronic obstructive pulmonary disease, unspecified: Secondary | ICD-10-CM | POA: Diagnosis not present

## 2017-06-29 DIAGNOSIS — E7800 Pure hypercholesterolemia, unspecified: Secondary | ICD-10-CM | POA: Diagnosis not present

## 2017-07-06 ENCOUNTER — Encounter: Payer: Self-pay | Admitting: *Deleted

## 2017-07-07 ENCOUNTER — Ambulatory Visit: Payer: Medicare HMO | Admitting: Cardiology

## 2017-07-07 VITALS — BP 145/70 | HR 57 | Ht 64.0 in | Wt 134.2 lb

## 2017-07-07 DIAGNOSIS — I10 Essential (primary) hypertension: Secondary | ICD-10-CM

## 2017-07-07 DIAGNOSIS — I2510 Atherosclerotic heart disease of native coronary artery without angina pectoris: Secondary | ICD-10-CM

## 2017-07-07 DIAGNOSIS — E782 Mixed hyperlipidemia: Secondary | ICD-10-CM

## 2017-07-07 NOTE — Patient Instructions (Signed)
Your physician wants you to follow-up in: 6 MONTHS WITH DR BRANCH You will receive a reminder letter in the mail two months in advance. If you don't receive a letter, please call our office to schedule the follow-up appointment.  Your physician recommends that you continue on your current medications as directed. Please refer to the Current Medication list given to you today.  Your physician recommends that you return for lab work BMP  Thank you for choosing [facility]!!     

## 2017-07-07 NOTE — Progress Notes (Signed)
Clinical Summary [name] is a 67 y.o.female seen today for follow up of the following medical problems.   1. CAD  - prior STEMI 12/2012, found to have severe multivessel disease on cath. Had emergent CABG 4 vessel (LIMA-LAD, SVG to ramus, SVG to LCX, SVG to RCA. LVEF 40% by LV gram  - repeat echo 04/2013 showed normalized LVEF, 55-60%   - no recent chest pain. No SOB or DOE - compliant with meds  2. Hyperlipidemia  - Prior side effects on crestor, tolerating atorvastatin well.   - reports recent labs with pcp. Complinant with meds - 08/2016 TC 156 TG 111 HDL 56 LDL 78  3. HTN - compliant with meds  4. COPD - PFTs 10/2014 with severe COPD -followed by pcp   5. Carotid stenosis - [place] 10/2014 with bilateral ICA 1-39% disease.  - no recent neuro symptoms.   6. Blood in stool - followed by GI.  7. Depression - previous inpatient admission for depression         Past Medical History:  Diagnosis Date  . Anxiety   . Arthritis   . Chronic back pain   . Complication of anesthesia   . Depression   . Depression   . Hypertension   . Myocardial infarction ([place])    x2.  [name] PONV (postoperative nausea and vomiting)      Allergies  Allergen Reactions  . Black Pepper [Piper] Hives, Itching and Rash  . Other Hives, Itching, Rash and Other (See Comments)    METALS  . Tape Hives, Itching and Rash     Current Outpatient Medications  Medication Sig Dispense Refill  . albuterol (PROVENTIL HFA;VENTOLIN HFA) 108 (90 Base) MCG/ACT inhaler Inhale 2 puffs into the lungs every 6 (six) hours as needed for wheezing or shortness of breath.    Jearl Klinefelter ELLIPTA 62.5-25 MCG/INH AEPB Inhale 1 puff into the lungs daily.     . ARIPiprazole (ABILIFY PO) Take by mouth daily.    [name] aspirin EC 81 MG tablet Take 81 mg by mouth daily.    [name] atorvastatin (LIPITOR) 80 MG tablet Take 80 mg by mouth daily.    . carvedilol (COREG) 3.125 MG tablet Take 1 tablet (3.125 mg  total) by mouth 2 (two) times daily. 60 tablet 6  . DULoxetine (CYMBALTA) 30 MG capsule     . HYDROcodone-acetaminophen (NORCO) 7.5-325 MG tablet Take 1 tablet by mouth 4 (four) times daily.    [name] lisinopril (PRINIVIL,ZESTRIL) 2.5 MG tablet TAKE 1 TABLET BY MOUTH ONCE DAILY 90 tablet 0  . pyridostigmine (MESTINON) 60 MG tablet     . traZODone (DESYREL) 50 MG tablet      No current facility-administered medications for this visit.      Past Surgical History:  Procedure Laterality Date  . ABDOMINAL HYSTERECTOMY    . APPENDECTOMY    . BACK SURGERY     x3; fusion with pins and rods  . BREAST LUMPECTOMY Left   . CHOLECYSTECTOMY    . COLONOSCOPY N/A 11/18/2013   Procedure: COLONOSCOPY;  Surgeon: Rogene Houston, MD;  [facility];  Service: Endoscopy;  Laterality: N/A;  1200  . CORONARY ARTERY BYPASS GRAFT N/A 01/22/2013   Procedure: Coronary Artery Bypass Grafting Times Four Using Left Internal Mammary Artery and Right Saphenous Leg Vein Harvested Endoscopically;  Surgeon: Grace Isaac, MD;  Location: [place];  Service: Open Heart Surgery;  Laterality: N/A;  . ESOPHAGOGASTRODUODENOSCOPY (EGD) WITH  PROPOFOL N/A 11/23/2015   Procedure: ESOPHAGOGASTRODUODENOSCOPY (EGD) WITH PROPOFOL;  Surgeon: Rogene Houston, MD;  [facility];  Service: Endoscopy;  Laterality: N/A;  12:35  . EXPLORATORY LAPAROTOMY    . LEFT HEART CATHETERIZATION WITH CORONARY ANGIOGRAM  01/22/2013   Procedure: LEFT HEART CATHETERIZATION WITH CORONARY ANGIOGRAM;  Surgeon: Lorretta Harp, MD;  Location: [facility] CATH LAB;  Service: Cardiovascular;;  . ORIF ANKLE FRACTURE Left   . SPINAL FUSION     x 2  . TONSILLECTOMY     age 58  . TUBAL LIGATION       Allergies  Allergen Reactions  . Black Pepper [Piper] Hives, Itching and Rash  . Other Hives, Itching, Rash and Other (See Comments)    METALS  . Tape Hives, Itching and Rash      Family History  Problem Relation Age of Onset  . Alcohol  abuse Father   . Schizophrenia Father   . Alcohol abuse Sister   . Alcohol abuse Brother   . Heart disease Unknown      Social History [name] reports that she has been smoking cigarettes and e-cigarettes.  She started smoking about 52 years ago. She has a 50.00 pack-year smoking history. She has never used smokeless tobacco. [name] reports that she does not drink alcohol.   Review of Systems CONSTITUTIONAL: No weight loss, fever, chills, weakness or fatigue.  HEENT: Eyes: No visual loss, blurred vision, double vision or yellow sclerae.No hearing loss, sneezing, congestion, runny nose or sore throat.  SKIN: No rash or itching.  CARDIOVASCULAR: per hpi RESPIRATORY: No shortness of breath, cough or sputum.  GASTROINTESTINAL: No anorexia, nausea, vomiting or diarrhea. No abdominal pain or blood.  GENITOURINARY: No burning on urination, no polyuria NEUROLOGICAL: No headache, dizziness, syncope, paralysis, ataxia, numbness or tingling in the extremities. No change in bowel or bladder control.  MUSCULOSKELETAL: No muscle, back pain, joint pain or stiffness.  LYMPHATICS: No enlarged nodes. No history of splenectomy.  PSYCHIATRIC: No history of depression or anxiety.  ENDOCRINOLOGIC: No reports of sweating, cold or heat intolerance. No polyuria or polydipsia.  [name]   Physical Examination Vitals:   07/07/17 1300  BP: (!) 145/70  Pulse: (!) 57  SpO2: 93%   Vitals:   07/07/17 1300  Weight: 134 lb 3.2 oz (60.9 kg)  Height: 5\' 4"  (1.626 m)    Gen: resting comfortably, no acute distress HEENT: no scleral icterus, pupils equal round and reactive, no palptable cervical adenopathy,  CV: RRR, no m/r/g, no jvd Resp: Clear to auscultation bilaterally GI: abdomen is soft, non-tender, non-distended, normal bowel sounds, no hepatosplenomegaly MSK: extremities are warm, no edema.  Skin: warm, no rash Neuro:  no focal deficits Psych: appropriate affect   Diagnostic  Studies 04/2013 Echo LVEF 58-85%, grade I diastolic dysfunction, mild MR  10/2014 PFTs: severe COPD     Assessment and Plan  1. CAD -no recent symptoms, continue current meds - EKG today shows SR, no acute ischemic changes  2. Hyperlipidemia  - she will continue statin, request labs from pcp  3. HTN -manual check 125/70, continue current meds - recheck BMET   F/u 6 months.       Arnoldo Lenis, M.D.

## 2017-07-13 ENCOUNTER — Encounter: Payer: Self-pay | Admitting: Cardiology

## 2017-07-28 DIAGNOSIS — Z6823 Body mass index (BMI) 23.0-23.9, adult: Secondary | ICD-10-CM | POA: Diagnosis not present

## 2017-07-28 DIAGNOSIS — J449 Chronic obstructive pulmonary disease, unspecified: Secondary | ICD-10-CM | POA: Diagnosis not present

## 2017-07-28 DIAGNOSIS — Z299 Encounter for prophylactic measures, unspecified: Secondary | ICD-10-CM | POA: Diagnosis not present

## 2017-07-28 DIAGNOSIS — Z79899 Other long term (current) drug therapy: Secondary | ICD-10-CM | POA: Diagnosis not present

## 2017-07-28 DIAGNOSIS — E1165 Type 2 diabetes mellitus with hyperglycemia: Secondary | ICD-10-CM | POA: Diagnosis not present

## 2017-07-28 DIAGNOSIS — I10 Essential (primary) hypertension: Secondary | ICD-10-CM | POA: Diagnosis not present

## 2017-07-28 DIAGNOSIS — R69 Illness, unspecified: Secondary | ICD-10-CM | POA: Diagnosis not present

## 2017-07-29 DIAGNOSIS — J449 Chronic obstructive pulmonary disease, unspecified: Secondary | ICD-10-CM | POA: Diagnosis not present

## 2017-07-29 DIAGNOSIS — I10 Essential (primary) hypertension: Secondary | ICD-10-CM | POA: Diagnosis not present

## 2017-07-29 DIAGNOSIS — E7800 Pure hypercholesterolemia, unspecified: Secondary | ICD-10-CM | POA: Diagnosis not present

## 2017-07-29 DIAGNOSIS — I2510 Atherosclerotic heart disease of native coronary artery without angina pectoris: Secondary | ICD-10-CM | POA: Diagnosis not present

## 2017-07-31 DIAGNOSIS — Z79899 Other long term (current) drug therapy: Secondary | ICD-10-CM | POA: Diagnosis not present

## 2017-08-11 ENCOUNTER — Telehealth: Payer: Self-pay | Admitting: *Deleted

## 2017-08-11 NOTE — Telephone Encounter (Signed)
Pt aware - routed to pcp  

## 2017-08-11 NOTE — Telephone Encounter (Signed)
-----   Message from Arnoldo Lenis, MD sent at 08/10/2017 12:47 PM EDT ----- Labs look fine  Zandra Abts MD

## 2017-08-31 DIAGNOSIS — J449 Chronic obstructive pulmonary disease, unspecified: Secondary | ICD-10-CM | POA: Diagnosis not present

## 2017-08-31 DIAGNOSIS — I10 Essential (primary) hypertension: Secondary | ICD-10-CM | POA: Diagnosis not present

## 2017-08-31 DIAGNOSIS — I2510 Atherosclerotic heart disease of native coronary artery without angina pectoris: Secondary | ICD-10-CM | POA: Diagnosis not present

## 2017-08-31 DIAGNOSIS — E7800 Pure hypercholesterolemia, unspecified: Secondary | ICD-10-CM | POA: Diagnosis not present

## 2017-09-09 DIAGNOSIS — M961 Postlaminectomy syndrome, not elsewhere classified: Secondary | ICD-10-CM | POA: Diagnosis not present

## 2017-09-09 DIAGNOSIS — I951 Orthostatic hypotension: Secondary | ICD-10-CM | POA: Diagnosis not present

## 2017-09-09 DIAGNOSIS — G894 Chronic pain syndrome: Secondary | ICD-10-CM | POA: Diagnosis not present

## 2017-09-09 DIAGNOSIS — Z79891 Long term (current) use of opiate analgesic: Secondary | ICD-10-CM | POA: Diagnosis not present

## 2017-10-09 DIAGNOSIS — I2510 Atherosclerotic heart disease of native coronary artery without angina pectoris: Secondary | ICD-10-CM | POA: Diagnosis not present

## 2017-10-09 DIAGNOSIS — J449 Chronic obstructive pulmonary disease, unspecified: Secondary | ICD-10-CM | POA: Diagnosis not present

## 2017-10-09 DIAGNOSIS — I10 Essential (primary) hypertension: Secondary | ICD-10-CM | POA: Diagnosis not present

## 2017-10-09 DIAGNOSIS — E7800 Pure hypercholesterolemia, unspecified: Secondary | ICD-10-CM | POA: Diagnosis not present

## 2017-10-13 DIAGNOSIS — Z7189 Other specified counseling: Secondary | ICD-10-CM | POA: Diagnosis not present

## 2017-10-13 DIAGNOSIS — I10 Essential (primary) hypertension: Secondary | ICD-10-CM | POA: Diagnosis not present

## 2017-10-13 DIAGNOSIS — Z1211 Encounter for screening for malignant neoplasm of colon: Secondary | ICD-10-CM | POA: Diagnosis not present

## 2017-10-13 DIAGNOSIS — R69 Illness, unspecified: Secondary | ICD-10-CM | POA: Diagnosis not present

## 2017-10-13 DIAGNOSIS — Z0000 Encounter for general adult medical examination without abnormal findings: Secondary | ICD-10-CM | POA: Diagnosis not present

## 2017-10-13 DIAGNOSIS — E039 Hypothyroidism, unspecified: Secondary | ICD-10-CM | POA: Diagnosis not present

## 2017-10-13 DIAGNOSIS — Z79899 Other long term (current) drug therapy: Secondary | ICD-10-CM | POA: Diagnosis not present

## 2017-10-13 DIAGNOSIS — Z299 Encounter for prophylactic measures, unspecified: Secondary | ICD-10-CM | POA: Diagnosis not present

## 2017-10-13 DIAGNOSIS — Z1331 Encounter for screening for depression: Secondary | ICD-10-CM | POA: Diagnosis not present

## 2017-10-13 DIAGNOSIS — E7800 Pure hypercholesterolemia, unspecified: Secondary | ICD-10-CM | POA: Diagnosis not present

## 2017-10-13 DIAGNOSIS — Z1339 Encounter for screening examination for other mental health and behavioral disorders: Secondary | ICD-10-CM | POA: Diagnosis not present

## 2017-10-13 DIAGNOSIS — Z6823 Body mass index (BMI) 23.0-23.9, adult: Secondary | ICD-10-CM | POA: Diagnosis not present

## 2017-11-03 DIAGNOSIS — M545 Low back pain: Secondary | ICD-10-CM | POA: Diagnosis not present

## 2017-11-03 DIAGNOSIS — J441 Chronic obstructive pulmonary disease with (acute) exacerbation: Secondary | ICD-10-CM | POA: Diagnosis not present

## 2017-11-03 DIAGNOSIS — I10 Essential (primary) hypertension: Secondary | ICD-10-CM | POA: Diagnosis not present

## 2017-11-03 DIAGNOSIS — Z299 Encounter for prophylactic measures, unspecified: Secondary | ICD-10-CM | POA: Diagnosis not present

## 2017-11-03 DIAGNOSIS — Z23 Encounter for immunization: Secondary | ICD-10-CM | POA: Diagnosis not present

## 2017-11-03 DIAGNOSIS — E1165 Type 2 diabetes mellitus with hyperglycemia: Secondary | ICD-10-CM | POA: Diagnosis not present

## 2017-11-03 DIAGNOSIS — Z6823 Body mass index (BMI) 23.0-23.9, adult: Secondary | ICD-10-CM | POA: Diagnosis not present

## 2017-11-17 DIAGNOSIS — Z79891 Long term (current) use of opiate analgesic: Secondary | ICD-10-CM | POA: Diagnosis not present

## 2017-11-17 DIAGNOSIS — I951 Orthostatic hypotension: Secondary | ICD-10-CM | POA: Diagnosis not present

## 2017-11-17 DIAGNOSIS — M961 Postlaminectomy syndrome, not elsewhere classified: Secondary | ICD-10-CM | POA: Diagnosis not present

## 2017-11-17 DIAGNOSIS — R269 Unspecified abnormalities of gait and mobility: Secondary | ICD-10-CM | POA: Diagnosis not present

## 2017-11-25 DIAGNOSIS — I2510 Atherosclerotic heart disease of native coronary artery without angina pectoris: Secondary | ICD-10-CM | POA: Diagnosis not present

## 2017-11-25 DIAGNOSIS — J449 Chronic obstructive pulmonary disease, unspecified: Secondary | ICD-10-CM | POA: Diagnosis not present

## 2017-11-25 DIAGNOSIS — E7800 Pure hypercholesterolemia, unspecified: Secondary | ICD-10-CM | POA: Diagnosis not present

## 2017-11-25 DIAGNOSIS — I10 Essential (primary) hypertension: Secondary | ICD-10-CM | POA: Diagnosis not present

## 2017-12-04 DIAGNOSIS — E1165 Type 2 diabetes mellitus with hyperglycemia: Secondary | ICD-10-CM | POA: Diagnosis not present

## 2017-12-04 DIAGNOSIS — Z6823 Body mass index (BMI) 23.0-23.9, adult: Secondary | ICD-10-CM | POA: Diagnosis not present

## 2017-12-04 DIAGNOSIS — I10 Essential (primary) hypertension: Secondary | ICD-10-CM | POA: Diagnosis not present

## 2017-12-04 DIAGNOSIS — Z299 Encounter for prophylactic measures, unspecified: Secondary | ICD-10-CM | POA: Diagnosis not present

## 2017-12-04 DIAGNOSIS — M545 Low back pain: Secondary | ICD-10-CM | POA: Diagnosis not present

## 2017-12-04 DIAGNOSIS — J449 Chronic obstructive pulmonary disease, unspecified: Secondary | ICD-10-CM | POA: Diagnosis not present

## 2017-12-22 DIAGNOSIS — I10 Essential (primary) hypertension: Secondary | ICD-10-CM | POA: Diagnosis not present

## 2017-12-22 DIAGNOSIS — J449 Chronic obstructive pulmonary disease, unspecified: Secondary | ICD-10-CM | POA: Diagnosis not present

## 2017-12-22 DIAGNOSIS — I2510 Atherosclerotic heart disease of native coronary artery without angina pectoris: Secondary | ICD-10-CM | POA: Diagnosis not present

## 2017-12-22 DIAGNOSIS — E7800 Pure hypercholesterolemia, unspecified: Secondary | ICD-10-CM | POA: Diagnosis not present

## 2018-01-06 ENCOUNTER — Encounter: Payer: Self-pay | Admitting: *Deleted

## 2018-01-07 ENCOUNTER — Encounter: Payer: Self-pay | Admitting: Cardiology

## 2018-01-07 ENCOUNTER — Ambulatory Visit: Payer: Medicare HMO | Admitting: Cardiology

## 2018-01-07 VITALS — BP 113/66 | HR 61 | Ht 64.0 in | Wt 129.6 lb

## 2018-01-07 DIAGNOSIS — I10 Essential (primary) hypertension: Secondary | ICD-10-CM

## 2018-01-07 DIAGNOSIS — E782 Mixed hyperlipidemia: Secondary | ICD-10-CM | POA: Diagnosis not present

## 2018-01-07 DIAGNOSIS — I2510 Atherosclerotic heart disease of native coronary artery without angina pectoris: Secondary | ICD-10-CM | POA: Diagnosis not present

## 2018-01-07 NOTE — Progress Notes (Signed)
Clinical Summary [name] is a 67 y.o.female seen today for follow up of the following medical problems.   1. CAD  - prior STEMI 12/2012, found to have severe multivessel disease on cath. Had emergent CABG 4 vessel (LIMA-LAD, SVG to ramus, SVG to LCX, SVG to RCA. LVEF 40% by LV gram  - repeat echo 04/2013 showed normalized LVEF, 55-60%   - no recent chest pain - no sob/doe - compliant with meds  2. Hyperlipidemia  - Prior side effects on crestor, tolerating atorvastatin well.  - 08/2016 TC 156 TG 111 HDL 56 LDL 78  - last labs with pcp   3. HTN - compliant with bp meds  4. COPD - PFTs 10/2014 with severe COPD -followed by pcp   5. Carotid stenosis - [place] 10/2014 with bilateral ICA 1-39% disease.          Past Medical History:  Diagnosis Date  . Anxiety   . Arthritis   . Chronic back pain   . Complication of anesthesia   . Depression   . Depression   . Hypertension   . Myocardial infarction ([place])    x2.  [name] PONV (postoperative nausea and vomiting)      Allergies  Allergen Reactions  . Black Pepper [Piper] Hives, Itching and Rash  . Other Hives, Itching, Rash and Other (See Comments)    METALS  . Tape Hives, Itching and Rash     Current Outpatient Medications  Medication Sig Dispense Refill  . albuterol (PROVENTIL HFA;VENTOLIN HFA) 108 (90 Base) MCG/ACT inhaler Inhale 2 puffs into the lungs every 6 (six) hours as needed for wheezing or shortness of breath.    Jearl Klinefelter ELLIPTA 62.5-25 MCG/INH AEPB Inhale 1 puff into the lungs daily.     . ARIPiprazole (ABILIFY PO) Take by mouth daily.    [name] aspirin EC 81 MG tablet Take 81 mg by mouth daily.    [name] atorvastatin (LIPITOR) 80 MG tablet Take 80 mg by mouth daily.    . carvedilol (COREG) 3.125 MG tablet Take 1 tablet (3.125 mg total) by mouth 2 (two) times daily. 60 tablet 6  . DULoxetine (CYMBALTA) 30 MG capsule     . HYDROcodone-acetaminophen (NORCO) 7.5-325 MG tablet Take 1 tablet  by mouth 4 (four) times daily.    [name] lisinopril (PRINIVIL,ZESTRIL) 2.5 MG tablet TAKE 1 TABLET BY MOUTH ONCE DAILY 90 tablet 0   No current facility-administered medications for this visit.      Past Surgical History:  Procedure Laterality Date  . ABDOMINAL HYSTERECTOMY    . APPENDECTOMY    . BACK SURGERY     x3; fusion with pins and rods  . BREAST LUMPECTOMY Left   . CHOLECYSTECTOMY    . COLONOSCOPY N/A 11/18/2013   Procedure: COLONOSCOPY;  Surgeon: Rogene Houston, MD;  [facility];  Service: Endoscopy;  Laterality: N/A;  1200  . CORONARY ARTERY BYPASS GRAFT N/A 01/22/2013   Procedure: Coronary Artery Bypass Grafting Times Four Using Left Internal Mammary Artery and Right Saphenous Leg Vein Harvested Endoscopically;  Surgeon: Grace Isaac, MD;  Location: [facility];  Service: Open Heart Surgery;  Laterality: N/A;  . ESOPHAGOGASTRODUODENOSCOPY (EGD) WITH PROPOFOL N/A 11/23/2015   Procedure: ESOPHAGOGASTRODUODENOSCOPY (EGD) WITH PROPOFOL;  Surgeon: Rogene Houston, MD;  [facility];  Service: Endoscopy;  Laterality: N/A;  12:35  . EXPLORATORY LAPAROTOMY    . LEFT HEART CATHETERIZATION WITH CORONARY ANGIOGRAM  01/22/2013  Procedure: LEFT HEART CATHETERIZATION WITH CORONARY ANGIOGRAM;  Surgeon: Lorretta Harp, MD;  Location: [facility] CATH LAB;  Service: Cardiovascular;;  . ORIF ANKLE FRACTURE Left   . SPINAL FUSION     x 2  . TONSILLECTOMY     age 68  . TUBAL LIGATION       Allergies  Allergen Reactions  . Black Pepper [Piper] Hives, Itching and Rash  . Other Hives, Itching, Rash and Other (See Comments)    METALS  . Tape Hives, Itching and Rash      Family History  Problem Relation Age of Onset  . Alcohol abuse Father   . Schizophrenia Father   . Alcohol abuse Sister   . Alcohol abuse Brother   . Heart disease Unknown      Social History [name] reports that she has been smoking cigarettes and e-cigarettes. She started smoking about 52  years ago. She has a 50.00 pack-year smoking history. She has never used smokeless tobacco. [name] reports no history of alcohol use.   Review of Systems CONSTITUTIONAL: No weight loss, fever, chills, weakness or fatigue.  HEENT: Eyes: No visual loss, blurred vision, double vision or yellow sclerae.No hearing loss, sneezing, congestion, runny nose or sore throat.  SKIN: No rash or itching.  CARDIOVASCULAR: per hpi RESPIRATORY: No shortness of breath, cough or sputum.  GASTROINTESTINAL: No anorexia, nausea, vomiting or diarrhea. No abdominal pain or blood.  GENITOURINARY: No burning on urination, no polyuria NEUROLOGICAL: No headache, dizziness, syncope, paralysis, ataxia, numbness or tingling in the extremities. No change in bowel or bladder control.  MUSCULOSKELETAL: No muscle, back pain, joint pain or stiffness.  LYMPHATICS: No enlarged nodes. No history of splenectomy.  PSYCHIATRIC: No history of depression or anxiety.  ENDOCRINOLOGIC: No reports of sweating, cold or heat intolerance. No polyuria or polydipsia.  [name]   Physical Examination Vitals:   01/07/18 1420  BP: 113/66  Pulse: 61  SpO2: 94%   Vitals:   01/07/18 1420  Weight: 129 lb 9.6 oz (58.8 kg)  Height: 5\' 4"  (1.626 m)    Gen: resting comfortably, no acute distress HEENT: no scleral icterus, pupils equal round and reactive, no palptable cervical adenopathy,  CV: RRR, no m/r/g, no jvd Resp: Clear to auscultation bilaterally GI: abdomen is soft, non-tender, non-distended, normal bowel sounds, no hepatosplenomegaly MSK: extremities are warm, no edema.  Skin: warm, no rash Neuro:  no focal deficits Psych: appropriate affect   Diagnostic Studies 04/2013 Echo LVEF 67-12%, grade I diastolic dysfunction, mild MR  10/2014 PFTs: severe COPD    Assessment and Plan  1. CAD -no symptoms, continue current meds  2. Hyperlipidemia  - cpntinue statin, request labs from pcp  3. HTN -at goal, continue  current meds   F/u 6 months.      Arnoldo Lenis, M.D.

## 2018-01-07 NOTE — Patient Instructions (Signed)

## 2018-01-12 ENCOUNTER — Encounter: Payer: Self-pay | Admitting: *Deleted

## 2018-01-22 DIAGNOSIS — J449 Chronic obstructive pulmonary disease, unspecified: Secondary | ICD-10-CM | POA: Diagnosis not present

## 2018-01-22 DIAGNOSIS — I10 Essential (primary) hypertension: Secondary | ICD-10-CM | POA: Diagnosis not present

## 2018-01-22 DIAGNOSIS — E7800 Pure hypercholesterolemia, unspecified: Secondary | ICD-10-CM | POA: Diagnosis not present

## 2018-01-22 DIAGNOSIS — I2510 Atherosclerotic heart disease of native coronary artery without angina pectoris: Secondary | ICD-10-CM | POA: Diagnosis not present

## 2018-02-08 DIAGNOSIS — I10 Essential (primary) hypertension: Secondary | ICD-10-CM | POA: Diagnosis not present

## 2018-02-08 DIAGNOSIS — E7800 Pure hypercholesterolemia, unspecified: Secondary | ICD-10-CM | POA: Diagnosis not present

## 2018-02-08 DIAGNOSIS — J449 Chronic obstructive pulmonary disease, unspecified: Secondary | ICD-10-CM | POA: Diagnosis not present

## 2018-02-08 DIAGNOSIS — Z299 Encounter for prophylactic measures, unspecified: Secondary | ICD-10-CM | POA: Diagnosis not present

## 2018-02-08 DIAGNOSIS — Z6822 Body mass index (BMI) 22.0-22.9, adult: Secondary | ICD-10-CM | POA: Diagnosis not present

## 2018-02-08 DIAGNOSIS — E1165 Type 2 diabetes mellitus with hyperglycemia: Secondary | ICD-10-CM | POA: Diagnosis not present

## 2018-02-08 DIAGNOSIS — R69 Illness, unspecified: Secondary | ICD-10-CM | POA: Diagnosis not present

## 2018-02-10 DIAGNOSIS — M961 Postlaminectomy syndrome, not elsewhere classified: Secondary | ICD-10-CM | POA: Diagnosis not present

## 2018-02-10 DIAGNOSIS — Z79891 Long term (current) use of opiate analgesic: Secondary | ICD-10-CM | POA: Diagnosis not present

## 2018-02-10 DIAGNOSIS — I951 Orthostatic hypotension: Secondary | ICD-10-CM | POA: Diagnosis not present

## 2018-02-10 DIAGNOSIS — R269 Unspecified abnormalities of gait and mobility: Secondary | ICD-10-CM | POA: Diagnosis not present

## 2018-02-24 DIAGNOSIS — I2510 Atherosclerotic heart disease of native coronary artery without angina pectoris: Secondary | ICD-10-CM | POA: Diagnosis not present

## 2018-02-24 DIAGNOSIS — E7800 Pure hypercholesterolemia, unspecified: Secondary | ICD-10-CM | POA: Diagnosis not present

## 2018-02-24 DIAGNOSIS — J449 Chronic obstructive pulmonary disease, unspecified: Secondary | ICD-10-CM | POA: Diagnosis not present

## 2018-02-24 DIAGNOSIS — I10 Essential (primary) hypertension: Secondary | ICD-10-CM | POA: Diagnosis not present

## 2018-03-23 DIAGNOSIS — J449 Chronic obstructive pulmonary disease, unspecified: Secondary | ICD-10-CM | POA: Diagnosis not present

## 2018-03-23 DIAGNOSIS — I2510 Atherosclerotic heart disease of native coronary artery without angina pectoris: Secondary | ICD-10-CM | POA: Diagnosis not present

## 2018-03-23 DIAGNOSIS — E7800 Pure hypercholesterolemia, unspecified: Secondary | ICD-10-CM | POA: Diagnosis not present

## 2018-03-23 DIAGNOSIS — I10 Essential (primary) hypertension: Secondary | ICD-10-CM | POA: Diagnosis not present

## 2018-03-31 DIAGNOSIS — J440 Chronic obstructive pulmonary disease with acute lower respiratory infection: Secondary | ICD-10-CM | POA: Diagnosis not present

## 2018-03-31 DIAGNOSIS — Z6821 Body mass index (BMI) 21.0-21.9, adult: Secondary | ICD-10-CM | POA: Diagnosis not present

## 2018-03-31 DIAGNOSIS — Z299 Encounter for prophylactic measures, unspecified: Secondary | ICD-10-CM | POA: Diagnosis not present

## 2018-03-31 DIAGNOSIS — E1165 Type 2 diabetes mellitus with hyperglycemia: Secondary | ICD-10-CM | POA: Diagnosis not present

## 2018-03-31 DIAGNOSIS — R69 Illness, unspecified: Secondary | ICD-10-CM | POA: Diagnosis not present

## 2018-03-31 DIAGNOSIS — I10 Essential (primary) hypertension: Secondary | ICD-10-CM | POA: Diagnosis not present

## 2018-03-31 DIAGNOSIS — I2510 Atherosclerotic heart disease of native coronary artery without angina pectoris: Secondary | ICD-10-CM | POA: Diagnosis not present

## 2018-04-19 DIAGNOSIS — Z79891 Long term (current) use of opiate analgesic: Secondary | ICD-10-CM | POA: Diagnosis not present

## 2018-04-19 DIAGNOSIS — M961 Postlaminectomy syndrome, not elsewhere classified: Secondary | ICD-10-CM | POA: Diagnosis not present

## 2018-04-19 DIAGNOSIS — I951 Orthostatic hypotension: Secondary | ICD-10-CM | POA: Diagnosis not present

## 2018-04-19 DIAGNOSIS — R269 Unspecified abnormalities of gait and mobility: Secondary | ICD-10-CM | POA: Diagnosis not present

## 2018-04-20 DIAGNOSIS — I2510 Atherosclerotic heart disease of native coronary artery without angina pectoris: Secondary | ICD-10-CM | POA: Diagnosis not present

## 2018-04-20 DIAGNOSIS — J449 Chronic obstructive pulmonary disease, unspecified: Secondary | ICD-10-CM | POA: Diagnosis not present

## 2018-04-20 DIAGNOSIS — I10 Essential (primary) hypertension: Secondary | ICD-10-CM | POA: Diagnosis not present

## 2018-04-20 DIAGNOSIS — E7800 Pure hypercholesterolemia, unspecified: Secondary | ICD-10-CM | POA: Diagnosis not present

## 2018-05-18 DIAGNOSIS — I2510 Atherosclerotic heart disease of native coronary artery without angina pectoris: Secondary | ICD-10-CM | POA: Diagnosis not present

## 2018-05-18 DIAGNOSIS — I10 Essential (primary) hypertension: Secondary | ICD-10-CM | POA: Diagnosis not present

## 2018-05-18 DIAGNOSIS — E7800 Pure hypercholesterolemia, unspecified: Secondary | ICD-10-CM | POA: Diagnosis not present

## 2018-05-18 DIAGNOSIS — J449 Chronic obstructive pulmonary disease, unspecified: Secondary | ICD-10-CM | POA: Diagnosis not present

## 2018-05-20 DIAGNOSIS — Z6821 Body mass index (BMI) 21.0-21.9, adult: Secondary | ICD-10-CM | POA: Diagnosis not present

## 2018-05-20 DIAGNOSIS — R69 Illness, unspecified: Secondary | ICD-10-CM | POA: Diagnosis not present

## 2018-05-20 DIAGNOSIS — I10 Essential (primary) hypertension: Secondary | ICD-10-CM | POA: Diagnosis not present

## 2018-05-20 DIAGNOSIS — Z299 Encounter for prophylactic measures, unspecified: Secondary | ICD-10-CM | POA: Diagnosis not present

## 2018-05-20 DIAGNOSIS — J440 Chronic obstructive pulmonary disease with acute lower respiratory infection: Secondary | ICD-10-CM | POA: Diagnosis not present

## 2018-05-20 DIAGNOSIS — E1165 Type 2 diabetes mellitus with hyperglycemia: Secondary | ICD-10-CM | POA: Diagnosis not present

## 2018-05-20 DIAGNOSIS — I2510 Atherosclerotic heart disease of native coronary artery without angina pectoris: Secondary | ICD-10-CM | POA: Diagnosis not present

## 2018-06-14 DIAGNOSIS — I2510 Atherosclerotic heart disease of native coronary artery without angina pectoris: Secondary | ICD-10-CM | POA: Diagnosis not present

## 2018-06-14 DIAGNOSIS — E7800 Pure hypercholesterolemia, unspecified: Secondary | ICD-10-CM | POA: Diagnosis not present

## 2018-06-14 DIAGNOSIS — I10 Essential (primary) hypertension: Secondary | ICD-10-CM | POA: Diagnosis not present

## 2018-06-14 DIAGNOSIS — J449 Chronic obstructive pulmonary disease, unspecified: Secondary | ICD-10-CM | POA: Diagnosis not present

## 2018-07-06 ENCOUNTER — Encounter: Payer: Self-pay | Admitting: Cardiology

## 2018-07-06 ENCOUNTER — Encounter: Payer: Self-pay | Admitting: *Deleted

## 2018-07-06 ENCOUNTER — Ambulatory Visit (INDEPENDENT_AMBULATORY_CARE_PROVIDER_SITE_OTHER): Payer: Medicare HMO | Admitting: Cardiology

## 2018-07-06 VITALS — BP 104/64 | HR 57 | Temp 99.2°F | Ht 62.0 in | Wt 121.2 lb

## 2018-07-06 DIAGNOSIS — E782 Mixed hyperlipidemia: Secondary | ICD-10-CM | POA: Diagnosis not present

## 2018-07-06 DIAGNOSIS — I2510 Atherosclerotic heart disease of native coronary artery without angina pectoris: Secondary | ICD-10-CM

## 2018-07-06 DIAGNOSIS — I10 Essential (primary) hypertension: Secondary | ICD-10-CM | POA: Diagnosis not present

## 2018-07-06 NOTE — Patient Instructions (Signed)

## 2018-07-06 NOTE — Progress Notes (Signed)
Clinical Summary Ms. Holstine is a 68 y.o.female  seen today for follow up of the following medical problems.   1. CAD  - prior STEMI 12/2012, found to have severe multivessel disease on cath. Had emergent CABG 4 vessel (LIMA-LAD, SVG to ramus, SVG to LCX, SVG to RCA. LVEF 40% by LV gram  - repeat echo 04/2013 showed normalized LVEF, 55-60%    - no recent chest pain. No SOB/DOE - compliant with meds  2. Hyperlipidemia  - Prior side effects on crestor, tolerating atorvastatin well.  - 08/2016 TC 156 TG 111 HDL 56 LDL 78  - labs followed by pcp   3. HTN - compliant with meds  4. COPD - PFTs 10/2014 with severe COPD -followed by pcp       Past Medical History:  Diagnosis Date  . Anxiety   . Arthritis   . Chronic back pain   . Complication of anesthesia   . Depression   . Depression   . Hypertension   . Myocardial infarction ([place])    x2.  [name] PONV (postoperative nausea and vomiting)      Allergies  Allergen Reactions  . Black Pepper [Piper] Hives, Itching and Rash  . Other Hives, Itching, Rash and Other (See Comments)    METALS  . Tape Hives, Itching and Rash     Current Outpatient Medications  Medication Sig Dispense Refill  . albuterol (PROVENTIL HFA;VENTOLIN HFA) 108 (90 Base) MCG/ACT inhaler Inhale 2 puffs into the lungs every 6 (six) hours as needed for wheezing or shortness of breath.    Jearl Klinefelter ELLIPTA 62.5-25 MCG/INH AEPB Inhale 1 puff into the lungs daily.     . ARIPiprazole (ABILIFY PO) Take by mouth daily.    [name] aspirin EC 81 MG tablet Take 81 mg by mouth daily.    [name] atorvastatin (LIPITOR) 80 MG tablet Take 80 mg by mouth daily.    . carvedilol (COREG) 6.25 MG tablet Take 6.25 mg by mouth 2 (two) times daily with a meal.    . gabapentin (NEURONTIN) 300 MG capsule Take 300 mg by mouth 3 (three) times daily.    [name] HYDROcodone-acetaminophen (NORCO) 7.5-325 MG tablet Take 1 tablet by mouth 4 (four) times daily.    [name] lisinopril  (PRINIVIL,ZESTRIL) 2.5 MG tablet TAKE 1 TABLET BY MOUTH ONCE DAILY 90 tablet 0  . metFORMIN (GLUCOPHAGE) 500 MG tablet Take 500 mg by mouth daily with breakfast.     No current facility-administered medications for this visit.      Past Surgical History:  Procedure Laterality Date  . ABDOMINAL HYSTERECTOMY    . APPENDECTOMY    . BACK SURGERY     x3; fusion with pins and rods  . BREAST LUMPECTOMY Left   . CHOLECYSTECTOMY    . COLONOSCOPY N/A 11/18/2013   Procedure: COLONOSCOPY;  Surgeon: Rogene Houston, MD;  [facility];  Service: Endoscopy;  Laterality: N/A;  1200  . CORONARY ARTERY BYPASS GRAFT N/A 01/22/2013   Procedure: Coronary Artery Bypass Grafting Times Four Using Left Internal Mammary Artery and Right Saphenous Leg Vein Harvested Endoscopically;  Surgeon: Grace Isaac, MD;  Location: [facility];  Service: Open Heart Surgery;  Laterality: N/A;  . ESOPHAGOGASTRODUODENOSCOPY (EGD) WITH PROPOFOL N/A 11/23/2015   Procedure: ESOPHAGOGASTRODUODENOSCOPY (EGD) WITH PROPOFOL;  Surgeon: Rogene Houston, MD;  [facility];  Service: Endoscopy;  Laterality: N/A;  12:35  . EXPLORATORY LAPAROTOMY    . LEFT HEART  CATHETERIZATION WITH CORONARY ANGIOGRAM  01/22/2013   Procedure: LEFT HEART CATHETERIZATION WITH CORONARY ANGIOGRAM;  Surgeon: Lorretta Harp, MD;  Location: [facility] CATH LAB;  Service: Cardiovascular;;  . ORIF ANKLE FRACTURE Left   . SPINAL FUSION     x 2  . TONSILLECTOMY     age 20  . TUBAL LIGATION       Allergies  Allergen Reactions  . Black Pepper [Piper] Hives, Itching and Rash  . Other Hives, Itching, Rash and Other (See Comments)    METALS  . Tape Hives, Itching and Rash      Family History  Problem Relation Age of Onset  . Alcohol abuse Father   . Schizophrenia Father   . Alcohol abuse Sister   . Alcohol abuse Brother   . Heart disease Unknown      Social History Ms. Cumbee reports that she has been smoking cigarettes and  e-cigarettes. She started smoking about 53 years ago. She has a 25.00 pack-year smoking history. She has never used smokeless tobacco. Ms. Neuhaus reports no history of alcohol use.   Review of Systems CONSTITUTIONAL: No weight loss, fever, chills, weakness or fatigue.  HEENT: Eyes: No visual loss, blurred vision, double vision or yellow sclerae.No hearing loss, sneezing, congestion, runny nose or sore throat.  SKIN: No rash or itching.  CARDIOVASCULAR: per hpi RESPIRATORY: No shortness of breath, cough or sputum.  GASTROINTESTINAL: No anorexia, nausea, vomiting or diarrhea. No abdominal pain or blood.  GENITOURINARY: No burning on urination, no polyuria NEUROLOGICAL: No headache, dizziness, syncope, paralysis, ataxia, numbness or tingling in the extremities. No change in bowel or bladder control.  MUSCULOSKELETAL: No muscle, back pain, joint pain or stiffness.  LYMPHATICS: No enlarged nodes. No history of splenectomy.  PSYCHIATRIC: No history of depression or anxiety.  ENDOCRINOLOGIC: No reports of sweating, cold or heat intolerance. No polyuria or polydipsia.  [name]   Physical Examination Today's Vitals   07/06/18 1334  BP: 104/64  Pulse: (!) 57  Temp: 99.2 F (37.3 C)  TempSrc: Temporal  Weight: 121 lb 3.2 oz (55 kg)  Height: 5\' 2"  (1.575 m)   Body mass index is 22.17 kg/m.  Gen: resting comfortably, no acute distress HEENT: no scleral icterus, pupils equal round and reactive, no palptable cervical adenopathy,  CV: RRR, no m/r/g, no jvd Resp: Clear to auscultation bilaterally GI: abdomen is soft, non-tender, non-distended, normal bowel sounds, no hepatosplenomegaly MSK: extremities are warm, no edema.  Skin: warm, no rash Neuro:  no focal deficits Psych: appropriate affect   Diagnostic Studies 04/2013 Echo LVEF 54-27%, grade I diastolic dysfunction, mild MR  10/2014 PFTs: severe COPD    Assessment and Plan  1. CAD -no symptoms, continue current meds  2.  Hyperlipidemia  - request pcp labs, continue statin  3. HTN - at goal, continue current meds   F/u 6 months.      Arnoldo Lenis, M.D

## 2018-07-12 DIAGNOSIS — J449 Chronic obstructive pulmonary disease, unspecified: Secondary | ICD-10-CM | POA: Diagnosis not present

## 2018-07-12 DIAGNOSIS — E7800 Pure hypercholesterolemia, unspecified: Secondary | ICD-10-CM | POA: Diagnosis not present

## 2018-07-12 DIAGNOSIS — I10 Essential (primary) hypertension: Secondary | ICD-10-CM | POA: Diagnosis not present

## 2018-07-12 DIAGNOSIS — I2510 Atherosclerotic heart disease of native coronary artery without angina pectoris: Secondary | ICD-10-CM | POA: Diagnosis not present

## 2018-07-20 DIAGNOSIS — M961 Postlaminectomy syndrome, not elsewhere classified: Secondary | ICD-10-CM | POA: Diagnosis not present

## 2018-07-20 DIAGNOSIS — Z79891 Long term (current) use of opiate analgesic: Secondary | ICD-10-CM | POA: Diagnosis not present

## 2018-07-20 DIAGNOSIS — I951 Orthostatic hypotension: Secondary | ICD-10-CM | POA: Diagnosis not present

## 2018-07-20 DIAGNOSIS — R269 Unspecified abnormalities of gait and mobility: Secondary | ICD-10-CM | POA: Diagnosis not present

## 2018-08-26 DIAGNOSIS — E1165 Type 2 diabetes mellitus with hyperglycemia: Secondary | ICD-10-CM | POA: Diagnosis not present

## 2018-08-26 DIAGNOSIS — E039 Hypothyroidism, unspecified: Secondary | ICD-10-CM | POA: Diagnosis not present

## 2018-08-26 DIAGNOSIS — J440 Chronic obstructive pulmonary disease with acute lower respiratory infection: Secondary | ICD-10-CM | POA: Diagnosis not present

## 2018-08-26 DIAGNOSIS — I2510 Atherosclerotic heart disease of native coronary artery without angina pectoris: Secondary | ICD-10-CM | POA: Diagnosis not present

## 2018-08-26 DIAGNOSIS — R69 Illness, unspecified: Secondary | ICD-10-CM | POA: Diagnosis not present

## 2018-08-26 DIAGNOSIS — Z6820 Body mass index (BMI) 20.0-20.9, adult: Secondary | ICD-10-CM | POA: Diagnosis not present

## 2018-08-26 DIAGNOSIS — Z299 Encounter for prophylactic measures, unspecified: Secondary | ICD-10-CM | POA: Diagnosis not present

## 2018-08-26 DIAGNOSIS — I10 Essential (primary) hypertension: Secondary | ICD-10-CM | POA: Diagnosis not present

## 2018-08-31 DIAGNOSIS — E7800 Pure hypercholesterolemia, unspecified: Secondary | ICD-10-CM | POA: Diagnosis not present

## 2018-08-31 DIAGNOSIS — I2510 Atherosclerotic heart disease of native coronary artery without angina pectoris: Secondary | ICD-10-CM | POA: Diagnosis not present

## 2018-08-31 DIAGNOSIS — I10 Essential (primary) hypertension: Secondary | ICD-10-CM | POA: Diagnosis not present

## 2018-08-31 DIAGNOSIS — Z299 Encounter for prophylactic measures, unspecified: Secondary | ICD-10-CM | POA: Diagnosis not present

## 2018-08-31 DIAGNOSIS — J449 Chronic obstructive pulmonary disease, unspecified: Secondary | ICD-10-CM | POA: Diagnosis not present

## 2018-10-18 DIAGNOSIS — I951 Orthostatic hypotension: Secondary | ICD-10-CM | POA: Diagnosis not present

## 2018-10-18 DIAGNOSIS — Z79891 Long term (current) use of opiate analgesic: Secondary | ICD-10-CM | POA: Diagnosis not present

## 2018-10-18 DIAGNOSIS — M961 Postlaminectomy syndrome, not elsewhere classified: Secondary | ICD-10-CM | POA: Diagnosis not present

## 2018-10-18 DIAGNOSIS — R269 Unspecified abnormalities of gait and mobility: Secondary | ICD-10-CM | POA: Diagnosis not present

## 2018-10-20 DIAGNOSIS — Z1331 Encounter for screening for depression: Secondary | ICD-10-CM | POA: Diagnosis not present

## 2018-10-20 DIAGNOSIS — Z7189 Other specified counseling: Secondary | ICD-10-CM | POA: Diagnosis not present

## 2018-10-20 DIAGNOSIS — I10 Essential (primary) hypertension: Secondary | ICD-10-CM | POA: Diagnosis not present

## 2018-10-20 DIAGNOSIS — Z6820 Body mass index (BMI) 20.0-20.9, adult: Secondary | ICD-10-CM | POA: Diagnosis not present

## 2018-10-20 DIAGNOSIS — Z79899 Other long term (current) drug therapy: Secondary | ICD-10-CM | POA: Diagnosis not present

## 2018-10-20 DIAGNOSIS — N183 Chronic kidney disease, stage 3 (moderate): Secondary | ICD-10-CM | POA: Diagnosis not present

## 2018-10-20 DIAGNOSIS — Z0000 Encounter for general adult medical examination without abnormal findings: Secondary | ICD-10-CM | POA: Diagnosis not present

## 2018-10-20 DIAGNOSIS — Z299 Encounter for prophylactic measures, unspecified: Secondary | ICD-10-CM | POA: Diagnosis not present

## 2018-10-20 DIAGNOSIS — R69 Illness, unspecified: Secondary | ICD-10-CM | POA: Diagnosis not present

## 2018-10-20 DIAGNOSIS — E7800 Pure hypercholesterolemia, unspecified: Secondary | ICD-10-CM | POA: Diagnosis not present

## 2018-10-20 DIAGNOSIS — E039 Hypothyroidism, unspecified: Secondary | ICD-10-CM | POA: Diagnosis not present

## 2018-10-20 DIAGNOSIS — R5383 Other fatigue: Secondary | ICD-10-CM | POA: Diagnosis not present

## 2018-10-20 DIAGNOSIS — Z1339 Encounter for screening examination for other mental health and behavioral disorders: Secondary | ICD-10-CM | POA: Diagnosis not present

## 2018-10-20 DIAGNOSIS — Z1211 Encounter for screening for malignant neoplasm of colon: Secondary | ICD-10-CM | POA: Diagnosis not present

## 2018-11-01 DIAGNOSIS — J449 Chronic obstructive pulmonary disease, unspecified: Secondary | ICD-10-CM | POA: Diagnosis not present

## 2018-11-01 DIAGNOSIS — I10 Essential (primary) hypertension: Secondary | ICD-10-CM | POA: Diagnosis not present

## 2018-11-01 DIAGNOSIS — E7800 Pure hypercholesterolemia, unspecified: Secondary | ICD-10-CM | POA: Diagnosis not present

## 2018-11-01 DIAGNOSIS — I2510 Atherosclerotic heart disease of native coronary artery without angina pectoris: Secondary | ICD-10-CM | POA: Diagnosis not present

## 2018-11-23 ENCOUNTER — Encounter (INDEPENDENT_AMBULATORY_CARE_PROVIDER_SITE_OTHER): Payer: Self-pay | Admitting: *Deleted

## 2018-12-03 DIAGNOSIS — I2510 Atherosclerotic heart disease of native coronary artery without angina pectoris: Secondary | ICD-10-CM | POA: Diagnosis not present

## 2018-12-03 DIAGNOSIS — J449 Chronic obstructive pulmonary disease, unspecified: Secondary | ICD-10-CM | POA: Diagnosis not present

## 2018-12-03 DIAGNOSIS — I10 Essential (primary) hypertension: Secondary | ICD-10-CM | POA: Diagnosis not present

## 2018-12-03 DIAGNOSIS — E7800 Pure hypercholesterolemia, unspecified: Secondary | ICD-10-CM | POA: Diagnosis not present

## 2018-12-08 DIAGNOSIS — E119 Type 2 diabetes mellitus without complications: Secondary | ICD-10-CM | POA: Diagnosis not present

## 2018-12-08 DIAGNOSIS — Z6821 Body mass index (BMI) 21.0-21.9, adult: Secondary | ICD-10-CM | POA: Diagnosis not present

## 2018-12-08 DIAGNOSIS — Z299 Encounter for prophylactic measures, unspecified: Secondary | ICD-10-CM | POA: Diagnosis not present

## 2018-12-08 DIAGNOSIS — R69 Illness, unspecified: Secondary | ICD-10-CM | POA: Diagnosis not present

## 2018-12-08 DIAGNOSIS — E1165 Type 2 diabetes mellitus with hyperglycemia: Secondary | ICD-10-CM | POA: Diagnosis not present

## 2018-12-08 DIAGNOSIS — I10 Essential (primary) hypertension: Secondary | ICD-10-CM | POA: Diagnosis not present

## 2018-12-08 DIAGNOSIS — J440 Chronic obstructive pulmonary disease with acute lower respiratory infection: Secondary | ICD-10-CM | POA: Diagnosis not present

## 2018-12-22 DIAGNOSIS — R69 Illness, unspecified: Secondary | ICD-10-CM | POA: Diagnosis not present

## 2018-12-24 DIAGNOSIS — Z299 Encounter for prophylactic measures, unspecified: Secondary | ICD-10-CM | POA: Diagnosis not present

## 2018-12-24 DIAGNOSIS — I10 Essential (primary) hypertension: Secondary | ICD-10-CM | POA: Diagnosis not present

## 2018-12-24 DIAGNOSIS — Z6820 Body mass index (BMI) 20.0-20.9, adult: Secondary | ICD-10-CM | POA: Diagnosis not present

## 2018-12-24 DIAGNOSIS — T84226A Displacement of internal fixation device of vertebrae, initial encounter: Secondary | ICD-10-CM | POA: Diagnosis not present

## 2018-12-24 DIAGNOSIS — R69 Illness, unspecified: Secondary | ICD-10-CM | POA: Diagnosis not present

## 2019-01-12 DIAGNOSIS — M961 Postlaminectomy syndrome, not elsewhere classified: Secondary | ICD-10-CM | POA: Diagnosis not present

## 2019-01-12 DIAGNOSIS — R269 Unspecified abnormalities of gait and mobility: Secondary | ICD-10-CM | POA: Diagnosis not present

## 2019-01-12 DIAGNOSIS — I951 Orthostatic hypotension: Secondary | ICD-10-CM | POA: Diagnosis not present

## 2019-01-12 DIAGNOSIS — Z79891 Long term (current) use of opiate analgesic: Secondary | ICD-10-CM | POA: Diagnosis not present

## 2019-01-26 DIAGNOSIS — Z20828 Contact with and (suspected) exposure to other viral communicable diseases: Secondary | ICD-10-CM | POA: Diagnosis not present

## 2019-01-27 DIAGNOSIS — I10 Essential (primary) hypertension: Secondary | ICD-10-CM | POA: Diagnosis not present

## 2019-01-27 DIAGNOSIS — I2510 Atherosclerotic heart disease of native coronary artery without angina pectoris: Secondary | ICD-10-CM | POA: Diagnosis not present

## 2019-01-27 DIAGNOSIS — J449 Chronic obstructive pulmonary disease, unspecified: Secondary | ICD-10-CM | POA: Diagnosis not present

## 2019-01-27 DIAGNOSIS — E7800 Pure hypercholesterolemia, unspecified: Secondary | ICD-10-CM | POA: Diagnosis not present

## 2019-02-21 DIAGNOSIS — I10 Essential (primary) hypertension: Secondary | ICD-10-CM | POA: Diagnosis not present

## 2019-02-21 DIAGNOSIS — J449 Chronic obstructive pulmonary disease, unspecified: Secondary | ICD-10-CM | POA: Diagnosis not present

## 2019-02-21 DIAGNOSIS — I2510 Atherosclerotic heart disease of native coronary artery without angina pectoris: Secondary | ICD-10-CM | POA: Diagnosis not present

## 2019-02-21 DIAGNOSIS — E7800 Pure hypercholesterolemia, unspecified: Secondary | ICD-10-CM | POA: Diagnosis not present

## 2019-03-21 DIAGNOSIS — E7800 Pure hypercholesterolemia, unspecified: Secondary | ICD-10-CM | POA: Diagnosis not present

## 2019-03-21 DIAGNOSIS — I2510 Atherosclerotic heart disease of native coronary artery without angina pectoris: Secondary | ICD-10-CM | POA: Diagnosis not present

## 2019-03-21 DIAGNOSIS — I10 Essential (primary) hypertension: Secondary | ICD-10-CM | POA: Diagnosis not present

## 2019-03-21 DIAGNOSIS — J449 Chronic obstructive pulmonary disease, unspecified: Secondary | ICD-10-CM | POA: Diagnosis not present

## 2019-03-25 DIAGNOSIS — J441 Chronic obstructive pulmonary disease with (acute) exacerbation: Secondary | ICD-10-CM | POA: Diagnosis not present

## 2019-03-25 DIAGNOSIS — E1165 Type 2 diabetes mellitus with hyperglycemia: Secondary | ICD-10-CM | POA: Diagnosis not present

## 2019-03-25 DIAGNOSIS — N183 Chronic kidney disease, stage 3 unspecified: Secondary | ICD-10-CM | POA: Diagnosis not present

## 2019-03-25 DIAGNOSIS — R69 Illness, unspecified: Secondary | ICD-10-CM | POA: Diagnosis not present

## 2019-03-25 DIAGNOSIS — I10 Essential (primary) hypertension: Secondary | ICD-10-CM | POA: Diagnosis not present

## 2019-03-25 DIAGNOSIS — Z299 Encounter for prophylactic measures, unspecified: Secondary | ICD-10-CM | POA: Diagnosis not present

## 2019-03-25 DIAGNOSIS — Z6820 Body mass index (BMI) 20.0-20.9, adult: Secondary | ICD-10-CM | POA: Diagnosis not present

## 2019-03-25 DIAGNOSIS — J440 Chronic obstructive pulmonary disease with acute lower respiratory infection: Secondary | ICD-10-CM | POA: Diagnosis not present

## 2019-04-11 DIAGNOSIS — R269 Unspecified abnormalities of gait and mobility: Secondary | ICD-10-CM | POA: Diagnosis not present

## 2019-04-11 DIAGNOSIS — Z79891 Long term (current) use of opiate analgesic: Secondary | ICD-10-CM | POA: Diagnosis not present

## 2019-04-11 DIAGNOSIS — M961 Postlaminectomy syndrome, not elsewhere classified: Secondary | ICD-10-CM | POA: Diagnosis not present

## 2019-04-11 DIAGNOSIS — G894 Chronic pain syndrome: Secondary | ICD-10-CM | POA: Diagnosis not present

## 2019-04-15 DIAGNOSIS — J449 Chronic obstructive pulmonary disease, unspecified: Secondary | ICD-10-CM | POA: Diagnosis not present

## 2019-04-15 DIAGNOSIS — E7800 Pure hypercholesterolemia, unspecified: Secondary | ICD-10-CM | POA: Diagnosis not present

## 2019-04-15 DIAGNOSIS — I10 Essential (primary) hypertension: Secondary | ICD-10-CM | POA: Diagnosis not present

## 2019-04-15 DIAGNOSIS — I2510 Atherosclerotic heart disease of native coronary artery without angina pectoris: Secondary | ICD-10-CM | POA: Diagnosis not present

## 2019-04-19 ENCOUNTER — Telehealth: Payer: Self-pay | Admitting: Cardiology

## 2019-04-19 NOTE — Telephone Encounter (Signed)
Virtual Visit Pre-Appointment Phone Call  "(Name), I am calling you today to discuss your upcoming appointment. We are currently trying to limit exposure to the virus that causes COVID-19 by seeing patients at home rather than in the office."  "What is the BEST phone number to call the day of the visit?" - [phone]  1. "Do you have or have access to (through a family member/friend) a smartphone with video capability that we can use for your visit?" a. If yes - list this number in appt notes as "cell" (if different from BEST phone #) and list the appointment type as a VIDEO visit in appointment notes b. If no - list the appointment type as a PHONE visit in appointment notes  2. Confirm consent - "In the setting of the current Covid19 crisis, you are scheduled for a (phone or video) visit with your provider on (date) at (time).  Just as we do with many in-office visits, in order for you to participate in this visit, we must obtain consent.  If you'd like, I can send this to your mychart (if signed up) or email for you to review.  Otherwise, I can obtain your verbal consent now.  All virtual visits are billed to your insurance company just like a normal visit would be.  By agreeing to a virtual visit, we'd like you to understand that the technology does not allow for your provider to perform an examination, and thus may limit your provider's ability to fully assess your condition. If your provider identifies any concerns that need to be evaluated in person, we will make arrangements to do so.  Finally, though the technology is pretty good, we cannot assure that it will always work on either your or our end, and in the setting of a video visit, we may have to convert it to a phone-only visit.  In either situation, we cannot ensure that we have a secure connection.  Are you willing to proceed?" STAFF: Did the patient verbally acknowledge consent to telehealth visit? Document YES/NO here:  YES  3. Advise patient to be prepared - "Two hours prior to your appointment, go ahead and check your blood pressure, pulse, oxygen saturation, and your weight (if you have the equipment to check those) and write them all down. When your visit starts, your provider will ask you for this information. If you have an Apple Watch or Kardia device, please plan to have heart rate information ready on the day of your appointment. Please have a pen and paper handy nearby the day of the visit as well."  4. Give patient instructions for MyChart download to smartphone OR Doximity/Doxy.me as below if video visit (depending on what platform provider is using)  5. Inform patient they will receive a phone call 15 minutes prior to their appointment time (may be from unknown caller ID) so they should be prepared to answer    [name] has been deemed a candidate for a follow-up tele-health visit to limit community exposure during the Covid-19 pandemic. I spoke with the patient via phone to ensure availability of phone/video source, confirm preferred email & phone number, and discuss instructions and expectations.  I reminded [name] to be prepared with any vital sign and/or heart rhythm information that could potentially be obtained via home monitoring, at the time of her visit. I reminded [name] to expect a phone call prior to her visit.  [name]  [name] 04/19/2019 1:13 PM   INSTRUCTIONS FOR DOWNLOADING THE MYCHART APP TO SMARTPHONE  - The patient must first make sure to have activated MyChart and know their login information - If Apple, go to [employer] and type in MyChart in the search bar and download the app. If Android, ask patient to go to [employer] and type in [place] in the search bar and download the app. The app is free but as with any other app downloads, their phone may require them to verify saved payment information or Apple/Android password.   - The patient will need to then log into the app with their MyChart username and password, and select [facility] as their healthcare provider to link the account. When it is time for your visit, go to the MyChart app, find appointments, and click Begin Video Visit. Be sure to Select Allow for your device to access the Microphone and Camera for your visit. You will then be connected, and your provider will be with you shortly.  **If they have any issues connecting, or need assistance please contact MyChart service desk [phone])**  **If using a computer, in order to ensure the best quality for their visit they will need to use either of the following Internet Browsers: [employer], or Google Chrome**  IF USING DOXIMITY or DOXY.ME - The patient will receive a link just prior to their visit by text.     FULL LENGTH CONSENT FOR TELE-HEALTH VISIT   I hereby voluntarily request, consent and authorize [facility] and its employed or contracted physicians, physician assistants, nurse practitioners or other licensed health care professionals (the Practitioner), to provide me with telemedicine health care services (the "Services") as deemed necessary by the treating Practitioner. I acknowledge and consent to receive the Services by the Practitioner via telemedicine. I understand that the telemedicine visit will involve communicating with the Practitioner through live audiovisual communication technology and the disclosure of certain medical information by electronic transmission. I acknowledge that I have been given the opportunity to request an in-person assessment or other available alternative prior to the telemedicine visit and am voluntarily participating in the telemedicine visit.  I understand that I have the right to withhold or withdraw my consent to the use of telemedicine in the course of my care at any time, without affecting my right to future care or treatment, and that  the Practitioner or I may terminate the telemedicine visit at any time. I understand that I have the right to inspect all information obtained and/or recorded in the course of the telemedicine visit and may receive copies of available information for a reasonable fee.  I understand that some of the potential risks of receiving the Services via telemedicine include:  [name] Delay or interruption in medical evaluation due to technological equipment failure or disruption; . Information transmitted may not be sufficient (e.g. poor resolution of images) to allow for appropriate medical decision making by the Practitioner; and/or  . In rare instances, security protocols could fail, causing a breach of personal health information.  Furthermore, I acknowledge that it is my responsibility to provide information about my medical history, conditions and care that is complete and accurate to the best of my ability. I acknowledge that Practitioner's advice, recommendations, and/or decision may be based on factors not within their control, such as incomplete or inaccurate data provided by me or distortions of diagnostic images or specimens that may result from electronic transmissions. I understand that the practice of  medicine is not an Chief Strategy Officer and that Practitioner makes no warranties or guarantees regarding treatment outcomes. I acknowledge that I will receive a copy of this consent concurrently upon execution via email to the email address I last provided but may also request a printed copy by calling the office of [facility].    I understand that my insurance will be billed for this visit.   I have read or had this consent read to me. . I understand the contents of this consent, which adequately explains the benefits and risks of the Services being provided via telemedicine.  . I have been provided ample opportunity to ask questions regarding this consent and the Services and have had my questions answered to  my satisfaction. . I give my informed consent for the services to be provided through the use of telemedicine in my medical care  By participating in this telemedicine visit I agree to the above.

## 2019-04-27 ENCOUNTER — Encounter: Payer: Self-pay | Admitting: *Deleted

## 2019-04-28 ENCOUNTER — Telehealth (INDEPENDENT_AMBULATORY_CARE_PROVIDER_SITE_OTHER): Payer: Medicare HMO | Admitting: Cardiology

## 2019-04-28 ENCOUNTER — Encounter: Payer: Self-pay | Admitting: Cardiology

## 2019-04-28 ENCOUNTER — Encounter: Payer: Self-pay | Admitting: *Deleted

## 2019-04-28 VITALS — BP 116/68 | HR 64 | Ht 64.0 in | Wt 116.0 lb

## 2019-04-28 DIAGNOSIS — I2510 Atherosclerotic heart disease of native coronary artery without angina pectoris: Secondary | ICD-10-CM

## 2019-04-28 DIAGNOSIS — E782 Mixed hyperlipidemia: Secondary | ICD-10-CM

## 2019-04-28 DIAGNOSIS — I10 Essential (primary) hypertension: Secondary | ICD-10-CM

## 2019-04-28 NOTE — Patient Instructions (Signed)

## 2019-04-28 NOTE — Progress Notes (Signed)
Virtual Visit via Telephone Note   This visit type was conducted due to national recommendations for restrictions regarding the COVID-19 Pandemic (e.g. social distancing) in an effort to limit this patient's exposure and mitigate transmission in our community.  Due to her co-morbid illnesses, this patient is at least at moderate risk for complications without adequate follow up.  This format is felt to be most appropriate for this patient at this time.  The patient did not have access to video technology/had technical difficulties with video requiring transitioning to audio format only (telephone).  All issues noted in this document were discussed and addressed.  No physical exam could be performed with this format.  Please refer to the patient's chart for her  consent to telehealth for [facility].   The patient was identified using 2 identifiers.  Date:  04/28/2019   ID:  [name], DOB [birth year], MRN [id number]  Patient Location: Home Provider Location: Office  PCP:  Glenda Chroman, MD  Cardiologist:  Carlyle Dolly, MD  Electrophysiologist:  None   Evaluation Performed:  Follow-Up Visit  Chief Complaint:  Follow up   History of Present Illness:    [name] is a 69 y.o. female seen today for follow up of the following medical problems.   1. CAD  - prior STEMI 12/2012, found to have severe multivessel disease on cath. Had emergent CABG 4 vessel (LIMA-LAD, SVG to ramus, SVG to LCX, SVG to RCA. LVEF 40% by LV gram  - repeat echo 04/2013 showed normalized LVEF, 55-60%    - denies any chest pain. No SOB/DOE - compliant with meds  2. Hyperlipidemia  - Prior side effects on crestor, tolerating atorvastatin well.  -labs followed by pcp   3. HTN -she is compliant with meds  4. COPD - PFTs 10/2014 with severe COPD -followed by pcp      The patient does not have symptoms concerning for COVID-19 infection (fever, chills, cough, or new  shortness of breath).    Past Medical History:  Diagnosis Date  . Anxiety   . Arthritis   . Chronic back pain   . Complication of anesthesia   . Depression   . Depression   . Hypertension   . Myocardial infarction ([place])    x2.  [name] PONV (postoperative nausea and vomiting)    Past Surgical History:  Procedure Laterality Date  . ABDOMINAL HYSTERECTOMY    . APPENDECTOMY    . BACK SURGERY     x3; fusion with pins and rods  . BREAST LUMPECTOMY Left   . CHOLECYSTECTOMY    . COLONOSCOPY N/A 11/18/2013   Procedure: COLONOSCOPY;  Surgeon: Rogene Houston, MD;  [facility];  Service: Endoscopy;  Laterality: N/A;  1200  . CORONARY ARTERY BYPASS GRAFT N/A 01/22/2013   Procedure: Coronary Artery Bypass Grafting Times Four Using Left Internal Mammary Artery and Right Saphenous Leg Vein Harvested Endoscopically;  Surgeon: Grace Isaac, MD;  Location: [facility];  Service: Open Heart Surgery;  Laterality: N/A;  . ESOPHAGOGASTRODUODENOSCOPY (EGD) WITH PROPOFOL N/A 11/23/2015   Procedure: ESOPHAGOGASTRODUODENOSCOPY (EGD) WITH PROPOFOL;  Surgeon: Rogene Houston, MD;  [facility];  Service: Endoscopy;  Laterality: N/A;  12:35  . EXPLORATORY LAPAROTOMY    . LEFT HEART CATHETERIZATION WITH CORONARY ANGIOGRAM  01/22/2013   Procedure: LEFT HEART CATHETERIZATION WITH CORONARY ANGIOGRAM;  Surgeon: Lorretta Harp, MD;  [facility];  Service: Cardiovascular;;  . ORIF ANKLE FRACTURE  Left   . SPINAL FUSION     x 2  . TONSILLECTOMY     age 10  . TUBAL LIGATION       No outpatient medications have been marked as taking for the 04/28/19 encounter (Appointment) with Arnoldo Lenis, MD.     Allergies:   Black pepper [piper], Other, and Tape   Social History   Tobacco Use  . Smoking status: Current Every Day Smoker    Packs/day: 0.50    Years: 50.00    Pack years: 25.00    Types: Cigarettes, E-cigarettes    Start date: 01/30/1965  . Smokeless tobacco: Never Used    . Tobacco comment: gradually cutting back. now down to half a pack daily   Substance Use Topics  . Alcohol use: No    Alcohol/week: 0.0 standard drinks    Comment: 04-05-15 per pt no  . Drug use: No    Comment: 04-05-15 per pt no     Family Hx: The patient's family history includes Alcohol abuse in her brother, father, and sister; Heart disease in an other family member; Schizophrenia in her father.  ROS:   Please see the history of present illness.     All other systems reviewed and are negative.   Prior CV studies:   The following studies were reviewed today:  04/2013 Echo LVEF 0000000, grade I diastolic dysfunction, mild MR  10/2014 PFTs: severe COPD  Labs/Other Tests and Data Reviewed:    EKG:  No ECG reviewed.  Recent Labs: No results found for requested labs within last 8760 hours.   Recent Lipid Panel Lab Results  Component Value Date/Time   CHOL 273 (H) 01/22/2013 01:45 PM   TRIG 196 (H) 01/22/2013 01:45 PM   HDL 32 (L) 01/22/2013 01:45 PM   CHOLHDL 8.5 01/22/2013 01:45 PM   LDLCALC 202 (H) 01/22/2013 01:45 PM    Wt Readings from Last 3 Encounters:  07/06/18 121 lb 3.2 oz (55 kg)  01/07/18 129 lb 9.6 oz (58.8 kg)  07/07/17 134 lb 3.2 oz (60.9 kg)     Objective:    Vital Signs:   Today's Vitals   04/28/19 0858  BP: 116/68  Pulse: 64  Weight: 116 lb (52.6 kg)  Height: 5\' 4"  (1.626 m)   Body mass index is 19.91 kg/m. Normal affect. Normal speech pattern and tone. Comfortable, no apparent distress. No audible signs of sob or wheezing.   ASSESSMENT & PLAN:    1. CAD -denies any symptoms, continue current meds  2. Hyperlipidemia  - continue statin, request pcp labs  3. HTN - bp at goal, continue current meds   COVID-19 Education: The signs and symptoms of COVID-19 were discussed with the patient and how to seek care for testing (follow up with PCP or arrange E-visit).  The importance of social distancing was discussed today.  Time:    Today, I have spent 20 minutes with the patient with telehealth technology discussing the above problems.     Medication Adjustments/Labs and Tests Ordered: Current medicines are reviewed at length with the patient today.  Concerns regarding medicines are outlined above.   Tests Ordered: No orders of the defined types were placed in this encounter.   Medication Changes: No orders of the defined types were placed in this encounter.   Follow Up:  Either In Person or Virtual in 6 month(s)  Signed, Carlyle Dolly, MD  04/28/2019 8:16 AM    [place]

## 2019-05-16 DIAGNOSIS — I10 Essential (primary) hypertension: Secondary | ICD-10-CM | POA: Diagnosis not present

## 2019-05-16 DIAGNOSIS — J449 Chronic obstructive pulmonary disease, unspecified: Secondary | ICD-10-CM | POA: Diagnosis not present

## 2019-05-16 DIAGNOSIS — E7800 Pure hypercholesterolemia, unspecified: Secondary | ICD-10-CM | POA: Diagnosis not present

## 2019-05-16 DIAGNOSIS — I2510 Atherosclerotic heart disease of native coronary artery without angina pectoris: Secondary | ICD-10-CM | POA: Diagnosis not present

## 2019-06-26 DIAGNOSIS — I2510 Atherosclerotic heart disease of native coronary artery without angina pectoris: Secondary | ICD-10-CM | POA: Diagnosis not present

## 2019-06-26 DIAGNOSIS — E7800 Pure hypercholesterolemia, unspecified: Secondary | ICD-10-CM | POA: Diagnosis not present

## 2019-06-26 DIAGNOSIS — I10 Essential (primary) hypertension: Secondary | ICD-10-CM | POA: Diagnosis not present

## 2019-06-26 DIAGNOSIS — J449 Chronic obstructive pulmonary disease, unspecified: Secondary | ICD-10-CM | POA: Diagnosis not present

## 2019-07-01 DIAGNOSIS — I2510 Atherosclerotic heart disease of native coronary artery without angina pectoris: Secondary | ICD-10-CM | POA: Diagnosis not present

## 2019-07-01 DIAGNOSIS — R69 Illness, unspecified: Secondary | ICD-10-CM | POA: Diagnosis not present

## 2019-07-01 DIAGNOSIS — E039 Hypothyroidism, unspecified: Secondary | ICD-10-CM | POA: Diagnosis not present

## 2019-07-01 DIAGNOSIS — I10 Essential (primary) hypertension: Secondary | ICD-10-CM | POA: Diagnosis not present

## 2019-07-01 DIAGNOSIS — Z299 Encounter for prophylactic measures, unspecified: Secondary | ICD-10-CM | POA: Diagnosis not present

## 2019-07-01 DIAGNOSIS — E1165 Type 2 diabetes mellitus with hyperglycemia: Secondary | ICD-10-CM | POA: Diagnosis not present

## 2019-07-11 DIAGNOSIS — M961 Postlaminectomy syndrome, not elsewhere classified: Secondary | ICD-10-CM | POA: Diagnosis not present

## 2019-07-11 DIAGNOSIS — R269 Unspecified abnormalities of gait and mobility: Secondary | ICD-10-CM | POA: Diagnosis not present

## 2019-07-11 DIAGNOSIS — G894 Chronic pain syndrome: Secondary | ICD-10-CM | POA: Diagnosis not present

## 2019-07-11 DIAGNOSIS — Z79891 Long term (current) use of opiate analgesic: Secondary | ICD-10-CM | POA: Diagnosis not present

## 2019-07-11 DIAGNOSIS — R69 Illness, unspecified: Secondary | ICD-10-CM | POA: Diagnosis not present

## 2019-07-11 DIAGNOSIS — M545 Low back pain: Secondary | ICD-10-CM | POA: Diagnosis not present

## 2019-07-27 DIAGNOSIS — I2510 Atherosclerotic heart disease of native coronary artery without angina pectoris: Secondary | ICD-10-CM | POA: Diagnosis not present

## 2019-07-27 DIAGNOSIS — E7800 Pure hypercholesterolemia, unspecified: Secondary | ICD-10-CM | POA: Diagnosis not present

## 2019-07-27 DIAGNOSIS — I10 Essential (primary) hypertension: Secondary | ICD-10-CM | POA: Diagnosis not present

## 2019-07-27 DIAGNOSIS — J449 Chronic obstructive pulmonary disease, unspecified: Secondary | ICD-10-CM | POA: Diagnosis not present

## 2019-08-26 DIAGNOSIS — I10 Essential (primary) hypertension: Secondary | ICD-10-CM | POA: Diagnosis not present

## 2019-08-26 DIAGNOSIS — E7800 Pure hypercholesterolemia, unspecified: Secondary | ICD-10-CM | POA: Diagnosis not present

## 2019-08-26 DIAGNOSIS — I2510 Atherosclerotic heart disease of native coronary artery without angina pectoris: Secondary | ICD-10-CM | POA: Diagnosis not present

## 2019-08-26 DIAGNOSIS — J449 Chronic obstructive pulmonary disease, unspecified: Secondary | ICD-10-CM | POA: Diagnosis not present

## 2019-10-04 DIAGNOSIS — R69 Illness, unspecified: Secondary | ICD-10-CM | POA: Diagnosis not present

## 2019-10-04 DIAGNOSIS — G894 Chronic pain syndrome: Secondary | ICD-10-CM | POA: Diagnosis not present

## 2019-10-04 DIAGNOSIS — R269 Unspecified abnormalities of gait and mobility: Secondary | ICD-10-CM | POA: Diagnosis not present

## 2019-10-04 DIAGNOSIS — M961 Postlaminectomy syndrome, not elsewhere classified: Secondary | ICD-10-CM | POA: Diagnosis not present

## 2019-10-04 DIAGNOSIS — Z79891 Long term (current) use of opiate analgesic: Secondary | ICD-10-CM | POA: Diagnosis not present

## 2019-10-04 DIAGNOSIS — M545 Low back pain: Secondary | ICD-10-CM | POA: Diagnosis not present

## 2019-10-07 DIAGNOSIS — R69 Illness, unspecified: Secondary | ICD-10-CM | POA: Diagnosis not present

## 2019-10-07 DIAGNOSIS — E1165 Type 2 diabetes mellitus with hyperglycemia: Secondary | ICD-10-CM | POA: Diagnosis not present

## 2019-10-07 DIAGNOSIS — Z299 Encounter for prophylactic measures, unspecified: Secondary | ICD-10-CM | POA: Diagnosis not present

## 2019-10-07 DIAGNOSIS — I10 Essential (primary) hypertension: Secondary | ICD-10-CM | POA: Diagnosis not present

## 2019-10-25 DIAGNOSIS — Z79899 Other long term (current) drug therapy: Secondary | ICD-10-CM | POA: Diagnosis not present

## 2019-10-25 DIAGNOSIS — E7800 Pure hypercholesterolemia, unspecified: Secondary | ICD-10-CM | POA: Diagnosis not present

## 2019-10-25 DIAGNOSIS — I10 Essential (primary) hypertension: Secondary | ICD-10-CM | POA: Diagnosis not present

## 2019-10-25 DIAGNOSIS — Z299 Encounter for prophylactic measures, unspecified: Secondary | ICD-10-CM | POA: Diagnosis not present

## 2019-10-25 DIAGNOSIS — E039 Hypothyroidism, unspecified: Secondary | ICD-10-CM | POA: Diagnosis not present

## 2019-10-25 DIAGNOSIS — Z1331 Encounter for screening for depression: Secondary | ICD-10-CM | POA: Diagnosis not present

## 2019-10-25 DIAGNOSIS — Z6822 Body mass index (BMI) 22.0-22.9, adult: Secondary | ICD-10-CM | POA: Diagnosis not present

## 2019-10-25 DIAGNOSIS — R69 Illness, unspecified: Secondary | ICD-10-CM | POA: Diagnosis not present

## 2019-10-25 DIAGNOSIS — Z0000 Encounter for general adult medical examination without abnormal findings: Secondary | ICD-10-CM | POA: Diagnosis not present

## 2019-10-25 DIAGNOSIS — Z1339 Encounter for screening examination for other mental health and behavioral disorders: Secondary | ICD-10-CM | POA: Diagnosis not present

## 2019-10-25 DIAGNOSIS — Z7189 Other specified counseling: Secondary | ICD-10-CM | POA: Diagnosis not present

## 2019-10-27 DIAGNOSIS — I2510 Atherosclerotic heart disease of native coronary artery without angina pectoris: Secondary | ICD-10-CM | POA: Diagnosis not present

## 2019-10-27 DIAGNOSIS — J449 Chronic obstructive pulmonary disease, unspecified: Secondary | ICD-10-CM | POA: Diagnosis not present

## 2019-10-27 DIAGNOSIS — I10 Essential (primary) hypertension: Secondary | ICD-10-CM | POA: Diagnosis not present

## 2019-10-27 DIAGNOSIS — E7800 Pure hypercholesterolemia, unspecified: Secondary | ICD-10-CM | POA: Diagnosis not present

## 2019-10-28 ENCOUNTER — Encounter: Payer: Self-pay | Admitting: Cardiology

## 2019-10-28 ENCOUNTER — Encounter: Payer: Self-pay | Admitting: *Deleted

## 2019-10-28 ENCOUNTER — Ambulatory Visit (INDEPENDENT_AMBULATORY_CARE_PROVIDER_SITE_OTHER): Payer: Medicare HMO | Admitting: Cardiology

## 2019-10-28 VITALS — BP 170/76 | HR 60 | Ht 64.0 in | Wt 132.0 lb

## 2019-10-28 DIAGNOSIS — I10 Essential (primary) hypertension: Secondary | ICD-10-CM

## 2019-10-28 DIAGNOSIS — E782 Mixed hyperlipidemia: Secondary | ICD-10-CM

## 2019-10-28 DIAGNOSIS — I2510 Atherosclerotic heart disease of native coronary artery without angina pectoris: Secondary | ICD-10-CM | POA: Diagnosis not present

## 2019-10-28 NOTE — Progress Notes (Signed)
Clinical Summary Ms. Freundlich is a 69 y.o.female seen today for follow up of the following medical problems.   1. CAD  - prior STEMI 12/2012, found to have severe multivessel disease on cath. Had emergent CABG 4 vessel (LIMA-LAD, SVG to ramus, SVG to LCX, SVG to RCA. LVEF 40% by LV gram  - repeat echo 04/2013 showed normalized LVEF, 55-60%   - no recent chest pain. Some SOB at times, coughing/wheezing at times.   2. Hyperlipidemia  - Prior side effects on crestor, tolerating atorvastatin well.  -recent labs with pcp   3. HTN -compliant with meds  4. COPD - PFTs 10/2014 with severe COPD -followed by pcp    Husband passed from [place] in January Has not had covid vacine, planning for today.     Past Medical History:  Diagnosis Date  . Anxiety   . Arthritis   . Chronic back pain   . Complication of anesthesia   . Depression   . Depression   . Hypertension   . Myocardial infarction ([place])    x2.  [name] PONV (postoperative nausea and vomiting)      Allergies  Allergen Reactions  . Black Pepper [Piper] Hives, Itching and Rash  . Other Hives, Itching, Rash and Other (See Comments)    METALS  . Tape Hives, Itching and Rash     Current Outpatient Medications  Medication Sig Dispense Refill  . albuterol (PROVENTIL HFA;VENTOLIN HFA) 108 (90 Base) MCG/ACT inhaler Inhale 2 puffs into the lungs every 6 (six) hours as needed for wheezing or shortness of breath.    . ALPRAZolam (XANAX) 0.5 MG tablet Take 0.5 mg by mouth 3 (three) times daily as needed for anxiety.    Jearl Klinefelter ELLIPTA 62.5-25 MCG/INH AEPB Inhale 1 puff into the lungs daily.     . ARIPiprazole (ABILIFY) 10 MG tablet Take 10 mg by mouth daily.    [name] aspirin EC 81 MG tablet Take 81 mg by mouth daily.    [name] atorvastatin (LIPITOR) 80 MG tablet Take 80 mg by mouth daily.    . carvedilol (COREG) 12.5 MG tablet Take 12.5 mg by mouth 2 (two) times daily.    [name] FLUoxetine HCl (PROZAC PO) Take by mouth.     . gabapentin (NEURONTIN) 300 MG capsule Take 300 mg by mouth 3 (three) times daily.    [name] HYDROcodone-acetaminophen (NORCO) 7.5-325 MG tablet Take 1 tablet by mouth 4 (four) times daily.    [name] lisinopril (PRINIVIL,ZESTRIL) 2.5 MG tablet TAKE 1 TABLET BY MOUTH ONCE DAILY 90 tablet 0  . metFORMIN (GLUCOPHAGE) 500 MG tablet Take 500 mg by mouth daily with breakfast.     No current facility-administered medications for this visit.     Past Surgical History:  Procedure Laterality Date  . ABDOMINAL HYSTERECTOMY    . APPENDECTOMY    . BACK SURGERY     x3; fusion with pins and rods  . BREAST LUMPECTOMY Left   . CHOLECYSTECTOMY    . COLONOSCOPY N/A 11/18/2013   Procedure: COLONOSCOPY;  Surgeon: Rogene Houston, MD;  [facility];  Service: Endoscopy;  Laterality: N/A;  1200  . CORONARY ARTERY BYPASS GRAFT N/A 01/22/2013   Procedure: Coronary Artery Bypass Grafting Times Four Using Left Internal Mammary Artery and Right Saphenous Leg Vein Harvested Endoscopically;  Surgeon: Grace Isaac, MD;  Location: [place];  Service: Open Heart Surgery;  Laterality: N/A;  . ESOPHAGOGASTRODUODENOSCOPY (EGD) WITH PROPOFOL N/A 11/23/2015  Procedure: ESOPHAGOGASTRODUODENOSCOPY (EGD) WITH PROPOFOL;  Surgeon: Rogene Houston, MD;  [facility];  Service: Endoscopy;  Laterality: N/A;  12:35  . EXPLORATORY LAPAROTOMY    . LEFT HEART CATHETERIZATION WITH CORONARY ANGIOGRAM  01/22/2013   Procedure: LEFT HEART CATHETERIZATION WITH CORONARY ANGIOGRAM;  Surgeon: Lorretta Harp, MD;  Location: [facility] CATH LAB;  Service: Cardiovascular;;  . ORIF ANKLE FRACTURE Left   . SPINAL FUSION     x 2  . TONSILLECTOMY     age 57  . TUBAL LIGATION       Allergies  Allergen Reactions  . Black Pepper [Piper] Hives, Itching and Rash  . Other Hives, Itching, Rash and Other (See Comments)    METALS  . Tape Hives, Itching and Rash      Family History  Problem Relation Age of Onset  . Alcohol abuse  Father   . Schizophrenia Father   . Alcohol abuse Sister   . Alcohol abuse Brother   . Heart disease Other      Social History Ms. Bogle reports that she has been smoking cigarettes and e-cigarettes. She started smoking about 54 years ago. She has a 25.00 pack-year smoking history. She has never used smokeless tobacco. Ms. Husband reports no history of alcohol use.   Review of Systems CONSTITUTIONAL: No weight loss, fever, chills, weakness or fatigue.  HEENT: Eyes: No visual loss, blurred vision, double vision or yellow sclerae.No hearing loss, sneezing, congestion, runny nose or sore throat.  SKIN: No rash or itching.  CARDIOVASCULAR: per hpi RESPIRATORY: No shortness of breath, cough or sputum.  GASTROINTESTINAL: No anorexia, nausea, vomiting or diarrhea. No abdominal pain or blood.  GENITOURINARY: No burning on urination, no polyuria NEUROLOGICAL: No headache, dizziness, syncope, paralysis, ataxia, numbness or tingling in the extremities. No change in bowel or bladder control.  MUSCULOSKELETAL: No muscle, back pain, joint pain or stiffness.  LYMPHATICS: No enlarged nodes. No history of splenectomy.  PSYCHIATRIC: No history of depression or anxiety.  ENDOCRINOLOGIC: No reports of sweating, cold or heat intolerance. No polyuria or polydipsia.  [name]   Physical Examination Today's Vitals   10/28/19 0944  BP: (!) 170/76  Pulse: 60  SpO2: 94%  Weight: 132 lb (59.9 kg)  Height: 5\' 4"  (1.626 m)   Body mass index is 22.66 kg/m.  Gen: resting comfortably, no acute distress HEENT: no scleral icterus, pupils equal round and reactive, no palptable cervical adenopathy,  CV: RRR, no m/rg, no jvd Resp: Clear to auscultation bilaterally GI: abdomen is soft, non-tender, non-distended, normal bowel sounds, no hepatosplenomegaly MSK: extremities are warm, no edema.  Skin: warm, no rash Neuro:  no focal deficits Psych: appropriate affect   Diagnostic Studies  04/2013 Echo LVEF  48-18%, grade I diastolic dysfunction, mild MR  10/2014 PFTs: severe COPD   Assessment and Plan  1. CAD -no symptoms, continue current meds  2. Hyperlipidemia  - request pcp labs, continue statin. If additional control needed would start zetia, did not tolerate crestor.   3. HTN - recheck manual 110/70, continue current meds     Arnoldo Lenis, M.D.

## 2019-10-28 NOTE — Patient Instructions (Signed)

## 2019-11-18 DIAGNOSIS — R69 Illness, unspecified: Secondary | ICD-10-CM | POA: Diagnosis not present

## 2019-11-25 DIAGNOSIS — J449 Chronic obstructive pulmonary disease, unspecified: Secondary | ICD-10-CM | POA: Diagnosis not present

## 2019-11-25 DIAGNOSIS — E7800 Pure hypercholesterolemia, unspecified: Secondary | ICD-10-CM | POA: Diagnosis not present

## 2019-11-25 DIAGNOSIS — I2510 Atherosclerotic heart disease of native coronary artery without angina pectoris: Secondary | ICD-10-CM | POA: Diagnosis not present

## 2019-11-25 DIAGNOSIS — I10 Essential (primary) hypertension: Secondary | ICD-10-CM | POA: Diagnosis not present

## 2019-12-27 DIAGNOSIS — R269 Unspecified abnormalities of gait and mobility: Secondary | ICD-10-CM | POA: Diagnosis not present

## 2019-12-27 DIAGNOSIS — I2510 Atherosclerotic heart disease of native coronary artery without angina pectoris: Secondary | ICD-10-CM | POA: Diagnosis not present

## 2019-12-27 DIAGNOSIS — M961 Postlaminectomy syndrome, not elsewhere classified: Secondary | ICD-10-CM | POA: Diagnosis not present

## 2019-12-27 DIAGNOSIS — G894 Chronic pain syndrome: Secondary | ICD-10-CM | POA: Diagnosis not present

## 2019-12-27 DIAGNOSIS — E7800 Pure hypercholesterolemia, unspecified: Secondary | ICD-10-CM | POA: Diagnosis not present

## 2019-12-27 DIAGNOSIS — R69 Illness, unspecified: Secondary | ICD-10-CM | POA: Diagnosis not present

## 2019-12-27 DIAGNOSIS — M5459 Other low back pain: Secondary | ICD-10-CM | POA: Diagnosis not present

## 2019-12-27 DIAGNOSIS — J449 Chronic obstructive pulmonary disease, unspecified: Secondary | ICD-10-CM | POA: Diagnosis not present

## 2019-12-27 DIAGNOSIS — I10 Essential (primary) hypertension: Secondary | ICD-10-CM | POA: Diagnosis not present

## 2019-12-27 DIAGNOSIS — Z79891 Long term (current) use of opiate analgesic: Secondary | ICD-10-CM | POA: Diagnosis not present

## 2020-01-12 DIAGNOSIS — E1165 Type 2 diabetes mellitus with hyperglycemia: Secondary | ICD-10-CM | POA: Diagnosis not present

## 2020-01-12 DIAGNOSIS — J449 Chronic obstructive pulmonary disease, unspecified: Secondary | ICD-10-CM | POA: Diagnosis not present

## 2020-01-12 DIAGNOSIS — I10 Essential (primary) hypertension: Secondary | ICD-10-CM | POA: Diagnosis not present

## 2020-01-12 DIAGNOSIS — Z299 Encounter for prophylactic measures, unspecified: Secondary | ICD-10-CM | POA: Diagnosis not present

## 2020-01-12 DIAGNOSIS — R69 Illness, unspecified: Secondary | ICD-10-CM | POA: Diagnosis not present

## 2020-01-26 DIAGNOSIS — I2510 Atherosclerotic heart disease of native coronary artery without angina pectoris: Secondary | ICD-10-CM | POA: Diagnosis not present

## 2020-01-26 DIAGNOSIS — E7800 Pure hypercholesterolemia, unspecified: Secondary | ICD-10-CM | POA: Diagnosis not present

## 2020-01-26 DIAGNOSIS — I10 Essential (primary) hypertension: Secondary | ICD-10-CM | POA: Diagnosis not present

## 2020-01-26 DIAGNOSIS — J449 Chronic obstructive pulmonary disease, unspecified: Secondary | ICD-10-CM | POA: Diagnosis not present

## 2020-02-27 DIAGNOSIS — M5459 Other low back pain: Secondary | ICD-10-CM | POA: Diagnosis not present

## 2020-02-27 DIAGNOSIS — G894 Chronic pain syndrome: Secondary | ICD-10-CM | POA: Diagnosis not present

## 2020-02-27 DIAGNOSIS — M961 Postlaminectomy syndrome, not elsewhere classified: Secondary | ICD-10-CM | POA: Diagnosis not present

## 2020-02-27 DIAGNOSIS — R269 Unspecified abnormalities of gait and mobility: Secondary | ICD-10-CM | POA: Diagnosis not present

## 2020-02-27 DIAGNOSIS — R69 Illness, unspecified: Secondary | ICD-10-CM | POA: Diagnosis not present

## 2020-02-27 DIAGNOSIS — Z79891 Long term (current) use of opiate analgesic: Secondary | ICD-10-CM | POA: Diagnosis not present

## 2020-03-26 DIAGNOSIS — E1165 Type 2 diabetes mellitus with hyperglycemia: Secondary | ICD-10-CM | POA: Diagnosis not present

## 2020-03-26 DIAGNOSIS — J449 Chronic obstructive pulmonary disease, unspecified: Secondary | ICD-10-CM | POA: Diagnosis not present

## 2020-03-26 DIAGNOSIS — E039 Hypothyroidism, unspecified: Secondary | ICD-10-CM | POA: Diagnosis not present

## 2020-03-26 DIAGNOSIS — E7800 Pure hypercholesterolemia, unspecified: Secondary | ICD-10-CM | POA: Diagnosis not present

## 2020-04-23 DIAGNOSIS — Z79891 Long term (current) use of opiate analgesic: Secondary | ICD-10-CM | POA: Diagnosis not present

## 2020-04-23 DIAGNOSIS — Z299 Encounter for prophylactic measures, unspecified: Secondary | ICD-10-CM | POA: Diagnosis not present

## 2020-04-23 DIAGNOSIS — G894 Chronic pain syndrome: Secondary | ICD-10-CM | POA: Diagnosis not present

## 2020-04-23 DIAGNOSIS — R269 Unspecified abnormalities of gait and mobility: Secondary | ICD-10-CM | POA: Diagnosis not present

## 2020-04-23 DIAGNOSIS — R69 Illness, unspecified: Secondary | ICD-10-CM | POA: Diagnosis not present

## 2020-04-23 DIAGNOSIS — M961 Postlaminectomy syndrome, not elsewhere classified: Secondary | ICD-10-CM | POA: Diagnosis not present

## 2020-04-23 DIAGNOSIS — E1165 Type 2 diabetes mellitus with hyperglycemia: Secondary | ICD-10-CM | POA: Diagnosis not present

## 2020-04-23 DIAGNOSIS — I10 Essential (primary) hypertension: Secondary | ICD-10-CM | POA: Diagnosis not present

## 2020-04-23 DIAGNOSIS — M5459 Other low back pain: Secondary | ICD-10-CM | POA: Diagnosis not present

## 2020-04-26 ENCOUNTER — Other Ambulatory Visit: Payer: Self-pay | Admitting: Internal Medicine

## 2020-04-26 DIAGNOSIS — Z1231 Encounter for screening mammogram for malignant neoplasm of breast: Secondary | ICD-10-CM

## 2020-04-30 ENCOUNTER — Inpatient Hospital Stay: Admission: RE | Admit: 2020-04-30 | Payer: Medicare HMO | Source: Ambulatory Visit

## 2020-05-09 ENCOUNTER — Other Ambulatory Visit: Payer: Self-pay | Admitting: Internal Medicine

## 2020-05-09 DIAGNOSIS — Z1231 Encounter for screening mammogram for malignant neoplasm of breast: Secondary | ICD-10-CM

## 2020-05-26 DIAGNOSIS — E039 Hypothyroidism, unspecified: Secondary | ICD-10-CM | POA: Diagnosis not present

## 2020-05-26 DIAGNOSIS — E7800 Pure hypercholesterolemia, unspecified: Secondary | ICD-10-CM | POA: Diagnosis not present

## 2020-05-26 DIAGNOSIS — J449 Chronic obstructive pulmonary disease, unspecified: Secondary | ICD-10-CM | POA: Diagnosis not present

## 2020-05-26 DIAGNOSIS — E1165 Type 2 diabetes mellitus with hyperglycemia: Secondary | ICD-10-CM | POA: Diagnosis not present

## 2020-06-25 DIAGNOSIS — J449 Chronic obstructive pulmonary disease, unspecified: Secondary | ICD-10-CM | POA: Diagnosis not present

## 2020-06-25 DIAGNOSIS — E039 Hypothyroidism, unspecified: Secondary | ICD-10-CM | POA: Diagnosis not present

## 2020-06-25 DIAGNOSIS — E7800 Pure hypercholesterolemia, unspecified: Secondary | ICD-10-CM | POA: Diagnosis not present

## 2020-06-25 DIAGNOSIS — E1165 Type 2 diabetes mellitus with hyperglycemia: Secondary | ICD-10-CM | POA: Diagnosis not present

## 2020-06-29 DIAGNOSIS — H5213 Myopia, bilateral: Secondary | ICD-10-CM | POA: Diagnosis not present

## 2020-06-29 DIAGNOSIS — H5203 Hypermetropia, bilateral: Secondary | ICD-10-CM | POA: Diagnosis not present

## 2020-06-29 DIAGNOSIS — Z0100 Encounter for examination of eyes and vision without abnormal findings: Secondary | ICD-10-CM | POA: Diagnosis not present

## 2020-06-29 DIAGNOSIS — H52229 Regular astigmatism, unspecified eye: Secondary | ICD-10-CM | POA: Diagnosis not present

## 2020-06-29 DIAGNOSIS — H524 Presbyopia: Secondary | ICD-10-CM | POA: Diagnosis not present

## 2020-07-16 DIAGNOSIS — G894 Chronic pain syndrome: Secondary | ICD-10-CM | POA: Diagnosis not present

## 2020-07-16 DIAGNOSIS — Z79891 Long term (current) use of opiate analgesic: Secondary | ICD-10-CM | POA: Diagnosis not present

## 2020-07-16 DIAGNOSIS — I10 Essential (primary) hypertension: Secondary | ICD-10-CM | POA: Diagnosis not present

## 2020-07-16 DIAGNOSIS — M961 Postlaminectomy syndrome, not elsewhere classified: Secondary | ICD-10-CM | POA: Diagnosis not present

## 2020-07-16 DIAGNOSIS — M5459 Other low back pain: Secondary | ICD-10-CM | POA: Diagnosis not present

## 2020-07-16 DIAGNOSIS — R269 Unspecified abnormalities of gait and mobility: Secondary | ICD-10-CM | POA: Diagnosis not present

## 2020-07-16 DIAGNOSIS — R69 Illness, unspecified: Secondary | ICD-10-CM | POA: Diagnosis not present

## 2020-07-26 DIAGNOSIS — J449 Chronic obstructive pulmonary disease, unspecified: Secondary | ICD-10-CM | POA: Diagnosis not present

## 2020-07-26 DIAGNOSIS — E039 Hypothyroidism, unspecified: Secondary | ICD-10-CM | POA: Diagnosis not present

## 2020-07-26 DIAGNOSIS — E7800 Pure hypercholesterolemia, unspecified: Secondary | ICD-10-CM | POA: Diagnosis not present

## 2020-07-26 DIAGNOSIS — E1165 Type 2 diabetes mellitus with hyperglycemia: Secondary | ICD-10-CM | POA: Diagnosis not present

## 2020-08-17 DIAGNOSIS — Z299 Encounter for prophylactic measures, unspecified: Secondary | ICD-10-CM | POA: Diagnosis not present

## 2020-08-17 DIAGNOSIS — E1165 Type 2 diabetes mellitus with hyperglycemia: Secondary | ICD-10-CM | POA: Diagnosis not present

## 2020-08-17 DIAGNOSIS — I2510 Atherosclerotic heart disease of native coronary artery without angina pectoris: Secondary | ICD-10-CM | POA: Diagnosis not present

## 2020-08-17 DIAGNOSIS — R69 Illness, unspecified: Secondary | ICD-10-CM | POA: Diagnosis not present

## 2020-08-17 DIAGNOSIS — J449 Chronic obstructive pulmonary disease, unspecified: Secondary | ICD-10-CM | POA: Diagnosis not present

## 2020-08-17 DIAGNOSIS — I10 Essential (primary) hypertension: Secondary | ICD-10-CM | POA: Diagnosis not present

## 2020-09-26 DIAGNOSIS — J449 Chronic obstructive pulmonary disease, unspecified: Secondary | ICD-10-CM | POA: Diagnosis not present

## 2020-09-26 DIAGNOSIS — E039 Hypothyroidism, unspecified: Secondary | ICD-10-CM | POA: Diagnosis not present

## 2020-09-26 DIAGNOSIS — E1165 Type 2 diabetes mellitus with hyperglycemia: Secondary | ICD-10-CM | POA: Diagnosis not present

## 2020-09-26 DIAGNOSIS — E7800 Pure hypercholesterolemia, unspecified: Secondary | ICD-10-CM | POA: Diagnosis not present

## 2020-10-16 DIAGNOSIS — R69 Illness, unspecified: Secondary | ICD-10-CM | POA: Diagnosis not present

## 2020-10-16 DIAGNOSIS — I10 Essential (primary) hypertension: Secondary | ICD-10-CM | POA: Diagnosis not present

## 2020-10-16 DIAGNOSIS — Z79891 Long term (current) use of opiate analgesic: Secondary | ICD-10-CM | POA: Diagnosis not present

## 2020-10-16 DIAGNOSIS — R269 Unspecified abnormalities of gait and mobility: Secondary | ICD-10-CM | POA: Diagnosis not present

## 2020-10-16 DIAGNOSIS — M961 Postlaminectomy syndrome, not elsewhere classified: Secondary | ICD-10-CM | POA: Diagnosis not present

## 2020-10-16 DIAGNOSIS — M5459 Other low back pain: Secondary | ICD-10-CM | POA: Diagnosis not present

## 2020-10-16 DIAGNOSIS — G894 Chronic pain syndrome: Secondary | ICD-10-CM | POA: Diagnosis not present

## 2020-10-26 DIAGNOSIS — E7800 Pure hypercholesterolemia, unspecified: Secondary | ICD-10-CM | POA: Diagnosis not present

## 2020-10-26 DIAGNOSIS — I10 Essential (primary) hypertension: Secondary | ICD-10-CM | POA: Diagnosis not present

## 2020-10-30 DIAGNOSIS — R42 Dizziness and giddiness: Secondary | ICD-10-CM | POA: Diagnosis not present

## 2020-10-30 DIAGNOSIS — R69 Illness, unspecified: Secondary | ICD-10-CM | POA: Diagnosis not present

## 2020-10-30 DIAGNOSIS — K30 Functional dyspepsia: Secondary | ICD-10-CM | POA: Diagnosis not present

## 2020-10-30 DIAGNOSIS — F17210 Nicotine dependence, cigarettes, uncomplicated: Secondary | ICD-10-CM | POA: Diagnosis not present

## 2020-10-30 DIAGNOSIS — Z299 Encounter for prophylactic measures, unspecified: Secondary | ICD-10-CM | POA: Diagnosis not present

## 2020-10-30 DIAGNOSIS — J441 Chronic obstructive pulmonary disease with (acute) exacerbation: Secondary | ICD-10-CM | POA: Diagnosis not present

## 2020-10-30 DIAGNOSIS — I10 Essential (primary) hypertension: Secondary | ICD-10-CM | POA: Diagnosis not present

## 2020-11-05 DIAGNOSIS — E7800 Pure hypercholesterolemia, unspecified: Secondary | ICD-10-CM | POA: Diagnosis not present

## 2020-11-05 DIAGNOSIS — J440 Chronic obstructive pulmonary disease with acute lower respiratory infection: Secondary | ICD-10-CM | POA: Diagnosis not present

## 2020-11-05 DIAGNOSIS — Z299 Encounter for prophylactic measures, unspecified: Secondary | ICD-10-CM | POA: Diagnosis not present

## 2020-11-05 DIAGNOSIS — J441 Chronic obstructive pulmonary disease with (acute) exacerbation: Secondary | ICD-10-CM | POA: Diagnosis not present

## 2020-11-05 DIAGNOSIS — R042 Hemoptysis: Secondary | ICD-10-CM | POA: Diagnosis not present

## 2020-11-07 ENCOUNTER — Encounter: Payer: Self-pay | Admitting: Cardiology

## 2020-11-07 DIAGNOSIS — R059 Cough, unspecified: Secondary | ICD-10-CM | POA: Diagnosis not present

## 2020-11-07 DIAGNOSIS — Z20822 Contact with and (suspected) exposure to covid-19: Secondary | ICD-10-CM | POA: Diagnosis not present

## 2020-11-07 DIAGNOSIS — R0600 Dyspnea, unspecified: Secondary | ICD-10-CM | POA: Diagnosis not present

## 2020-11-07 DIAGNOSIS — J42 Unspecified chronic bronchitis: Secondary | ICD-10-CM | POA: Diagnosis not present

## 2020-11-07 DIAGNOSIS — Z8679 Personal history of other diseases of the circulatory system: Secondary | ICD-10-CM | POA: Diagnosis not present

## 2020-11-07 DIAGNOSIS — R062 Wheezing: Secondary | ICD-10-CM | POA: Diagnosis not present

## 2020-11-07 DIAGNOSIS — D649 Anemia, unspecified: Secondary | ICD-10-CM | POA: Diagnosis not present

## 2020-11-07 DIAGNOSIS — R0902 Hypoxemia: Secondary | ICD-10-CM | POA: Diagnosis not present

## 2020-11-09 ENCOUNTER — Ambulatory Visit: Payer: Medicare HMO | Admitting: Cardiology

## 2020-11-09 DIAGNOSIS — J42 Unspecified chronic bronchitis: Secondary | ICD-10-CM | POA: Diagnosis not present

## 2020-11-09 DIAGNOSIS — E1100 Type 2 diabetes mellitus with hyperosmolarity without nonketotic hyperglycemic-hyperosmolar coma (NKHHC): Secondary | ICD-10-CM | POA: Diagnosis not present

## 2020-11-09 DIAGNOSIS — R69 Illness, unspecified: Secondary | ICD-10-CM | POA: Diagnosis not present

## 2020-11-09 DIAGNOSIS — J441 Chronic obstructive pulmonary disease with (acute) exacerbation: Secondary | ICD-10-CM | POA: Diagnosis not present

## 2020-11-09 DIAGNOSIS — D649 Anemia, unspecified: Secondary | ICD-10-CM | POA: Diagnosis not present

## 2020-11-09 DIAGNOSIS — N179 Acute kidney failure, unspecified: Secondary | ICD-10-CM | POA: Diagnosis not present

## 2020-11-09 DIAGNOSIS — Z9981 Dependence on supplemental oxygen: Secondary | ICD-10-CM | POA: Diagnosis not present

## 2020-11-09 DIAGNOSIS — I2510 Atherosclerotic heart disease of native coronary artery without angina pectoris: Secondary | ICD-10-CM | POA: Diagnosis not present

## 2020-11-09 DIAGNOSIS — J9621 Acute and chronic respiratory failure with hypoxia: Secondary | ICD-10-CM | POA: Diagnosis not present

## 2020-11-09 DIAGNOSIS — I10 Essential (primary) hypertension: Secondary | ICD-10-CM | POA: Diagnosis not present

## 2020-11-09 DIAGNOSIS — R059 Cough, unspecified: Secondary | ICD-10-CM | POA: Diagnosis not present

## 2020-11-09 DIAGNOSIS — E785 Hyperlipidemia, unspecified: Secondary | ICD-10-CM | POA: Diagnosis not present

## 2020-11-09 DIAGNOSIS — E119 Type 2 diabetes mellitus without complications: Secondary | ICD-10-CM | POA: Diagnosis not present

## 2020-11-09 DIAGNOSIS — Z8679 Personal history of other diseases of the circulatory system: Secondary | ICD-10-CM | POA: Diagnosis not present

## 2020-11-09 DIAGNOSIS — Z79899 Other long term (current) drug therapy: Secondary | ICD-10-CM | POA: Diagnosis not present

## 2020-11-09 DIAGNOSIS — Z7952 Long term (current) use of systemic steroids: Secondary | ICD-10-CM | POA: Diagnosis not present

## 2020-11-09 DIAGNOSIS — E11649 Type 2 diabetes mellitus with hypoglycemia without coma: Secondary | ICD-10-CM | POA: Diagnosis not present

## 2020-11-09 DIAGNOSIS — Z7984 Long term (current) use of oral hypoglycemic drugs: Secondary | ICD-10-CM | POA: Diagnosis not present

## 2020-11-09 DIAGNOSIS — R0602 Shortness of breath: Secondary | ICD-10-CM | POA: Diagnosis not present

## 2020-11-09 DIAGNOSIS — Z794 Long term (current) use of insulin: Secondary | ICD-10-CM | POA: Diagnosis not present

## 2020-11-09 DIAGNOSIS — R0902 Hypoxemia: Secondary | ICD-10-CM | POA: Diagnosis not present

## 2020-11-10 DIAGNOSIS — E785 Hyperlipidemia, unspecified: Secondary | ICD-10-CM | POA: Diagnosis not present

## 2020-11-10 DIAGNOSIS — J9621 Acute and chronic respiratory failure with hypoxia: Secondary | ICD-10-CM | POA: Diagnosis not present

## 2020-11-10 DIAGNOSIS — R69 Illness, unspecified: Secondary | ICD-10-CM | POA: Diagnosis not present

## 2020-11-10 DIAGNOSIS — Z7952 Long term (current) use of systemic steroids: Secondary | ICD-10-CM | POA: Diagnosis not present

## 2020-11-10 DIAGNOSIS — Z79899 Other long term (current) drug therapy: Secondary | ICD-10-CM | POA: Diagnosis not present

## 2020-11-10 DIAGNOSIS — J441 Chronic obstructive pulmonary disease with (acute) exacerbation: Secondary | ICD-10-CM | POA: Diagnosis not present

## 2020-11-10 DIAGNOSIS — I10 Essential (primary) hypertension: Secondary | ICD-10-CM | POA: Diagnosis not present

## 2020-11-10 DIAGNOSIS — E119 Type 2 diabetes mellitus without complications: Secondary | ICD-10-CM | POA: Diagnosis not present

## 2020-11-10 DIAGNOSIS — D649 Anemia, unspecified: Secondary | ICD-10-CM | POA: Diagnosis not present

## 2020-11-11 DIAGNOSIS — E785 Hyperlipidemia, unspecified: Secondary | ICD-10-CM | POA: Diagnosis not present

## 2020-11-11 DIAGNOSIS — Z9981 Dependence on supplemental oxygen: Secondary | ICD-10-CM | POA: Diagnosis not present

## 2020-11-11 DIAGNOSIS — D649 Anemia, unspecified: Secondary | ICD-10-CM | POA: Diagnosis not present

## 2020-11-11 DIAGNOSIS — J441 Chronic obstructive pulmonary disease with (acute) exacerbation: Secondary | ICD-10-CM | POA: Diagnosis not present

## 2020-11-11 DIAGNOSIS — E119 Type 2 diabetes mellitus without complications: Secondary | ICD-10-CM | POA: Diagnosis not present

## 2020-11-11 DIAGNOSIS — Z7984 Long term (current) use of oral hypoglycemic drugs: Secondary | ICD-10-CM | POA: Diagnosis not present

## 2020-11-11 DIAGNOSIS — R69 Illness, unspecified: Secondary | ICD-10-CM | POA: Diagnosis not present

## 2020-11-11 DIAGNOSIS — N179 Acute kidney failure, unspecified: Secondary | ICD-10-CM | POA: Diagnosis not present

## 2020-11-11 DIAGNOSIS — I10 Essential (primary) hypertension: Secondary | ICD-10-CM | POA: Diagnosis not present

## 2020-11-11 DIAGNOSIS — J9621 Acute and chronic respiratory failure with hypoxia: Secondary | ICD-10-CM | POA: Diagnosis not present

## 2020-11-13 DIAGNOSIS — J441 Chronic obstructive pulmonary disease with (acute) exacerbation: Secondary | ICD-10-CM | POA: Diagnosis not present

## 2020-11-13 DIAGNOSIS — J9621 Acute and chronic respiratory failure with hypoxia: Secondary | ICD-10-CM | POA: Diagnosis not present

## 2020-11-14 DIAGNOSIS — R059 Cough, unspecified: Secondary | ICD-10-CM | POA: Diagnosis not present

## 2020-11-15 DIAGNOSIS — J441 Chronic obstructive pulmonary disease with (acute) exacerbation: Secondary | ICD-10-CM | POA: Diagnosis not present

## 2020-11-15 DIAGNOSIS — J9621 Acute and chronic respiratory failure with hypoxia: Secondary | ICD-10-CM | POA: Diagnosis not present

## 2020-11-16 DIAGNOSIS — R0902 Hypoxemia: Secondary | ICD-10-CM | POA: Diagnosis not present

## 2020-11-16 DIAGNOSIS — Z8679 Personal history of other diseases of the circulatory system: Secondary | ICD-10-CM | POA: Diagnosis not present

## 2020-11-16 DIAGNOSIS — D649 Anemia, unspecified: Secondary | ICD-10-CM | POA: Diagnosis not present

## 2020-11-16 DIAGNOSIS — J42 Unspecified chronic bronchitis: Secondary | ICD-10-CM | POA: Diagnosis not present

## 2020-11-16 NOTE — Discharge Summary (Signed)
 Physician Discharge Summary  Admit date: 11/07/2020  Discharge date: 11/16/2020   Discharge to: Home with home health  Discharge Service: General Medicine (MED)  Discharge Attending Physician: Leta KATHEE Fear, MD  Discharge Diagnoses:  Principal Problem:   COPD exacerbation (CMS-HCC) Active Problems:   Symptomatic anemia   Acute on chronic respiratory failure with hypoxia (CMS-HCC)   Dyslipidemia   Essential hypertension   Tobacco abuse   Leukocytosis   Bronchitis Resolved Problems:   * No resolved hospital problems. *     Procedures: None  Consults:     Physical Therapy Evaluation and Treatment  Pertinent Test Results:  All lab results last 24 hours:   Recent Results (from the past 24 hour(s))  POCT Glucose   Collection Time: 11/15/20  9:08 AM  Result Value Ref Range   Glucose, POC 159 (H) 70 - 105 mg/dL  POCT Glucose   Collection Time: 11/15/20 11:57 AM  Result Value Ref Range   Glucose, POC 130 (H) 70 - 105 mg/dL  POCT Glucose   Collection Time: 11/15/20  5:13 PM  Result Value Ref Range   Glucose, POC 79 70 - 105 mg/dL  POCT Glucose   Collection Time: 11/15/20  9:10 PM  Result Value Ref Range   Glucose, POC 124 (H) 70 - 105 mg/dL  CBC w/ Differential   Collection Time: 11/16/20  5:29 AM  Result Value Ref Range   WBC 15.2 (H) 4.0 - 10.5 10*9/L   RBC 3.85 3.80 - 5.10 10*12/L   HGB 9.9 (L) 11.5 - 15.0 g/dL   HCT 67.0 (L) 65.9 - 55.9 %   MCV 85.5 80.0 - 98.0 fL   MCH 25.7 (L) 27.0 - 34.0 pg   MCHC 30.1 (L) 32.0 - 36.0 g/dL   RDW 82.3 (H) 88.4 - 85.4 %   MPV 11.7 (H) 7.4 - 10.4 fL   Platelet 162 140 - 415 10*9/L   Neutrophils % 86.1 %   Lymphocytes % 5.7 %   Monocytes % 6.7 %   Eosinophils % 0.1 %   Basophils % 0.1 %   Absolute Neutrophils 13.0 (H) 1.8 - 7.8 10*9/L   Absolute Lymphocytes 0.9 0.7 - 4.5 10*9/L   Absolute Monocytes 1.0 0.1 - 1.0 10*9/L   Absolute Eosinophils 0.0 0.0 - 0.4 10*9/L   Absolute Basophils 0.0 0.0 - 0.2 10*9/L  POCT Glucose    Collection Time: 11/16/20  7:10 AM  Result Value Ref Range   Glucose, POC 110 (H) 70 - 105 mg/dL    Hospital Course:  Patient was admitted to the hospital as inpatient.  Patient is a acute on chronic respiratory failure.  Patient has hypoxia.  COPD exacerbation with progressive worsening. Patient was put on the IV Solu-Medrol . Given the nebulizer treatment and oxygen .  Slowly started getting better. Patient also has anemia.  Type and cross transfuse 2 units of blood. Hemoglobin was low initially. Will need GI evaluation when patient agrees. Continue other medications same. O2 at 2 L continued. Patient continued to be progressively weak and not feeling much better. Skilled nursing facility recommended which patient has declined. Discussed with the caseworker. Patient will be going home with home health. She is high risk and progressive worsening. In the future if she gets worse palliative care and hospice discussion was initiated but she has not committed at this time. Nebulizer machine will be set up.  Patient will be going home with her home nebulizer machine.  Medication will be called in.  Discussed with the patient and the son. Son is aware that patient has progressive worsening. She has end-stage COPD and would get worse. If family decides will go for palliative care or hospice care. At this time home health will be following. Her white count was coming down and patient will be going home on the p.o. antibiotics. Outpatient video appointment for follow-up in 1 week with Dr. Rosamond     I spent greater than 30 mins in the discharge of this patient.  Condition at Discharge: stable Discharge Medications:    Your Medication List    STOP taking these medications   levoFLOXacin  500 MG tablet Commonly known as: LEVAQUIN      START taking these medications   cefuroxime  500 MG tablet Commonly known as: CEFTIN  Take 1 tablet (500 mg total) by mouth Two (2) times a day for 7  days.   ipratropium-albuteroL  0.5-2.5 mg/3 mL nebulizer Commonly known as: DUO-NEB Inhale 3 mL by nebulization every four (4) hours as needed (SOB   J 44.9).     CONTINUE taking these medications   albuterol  90 mcg/actuation inhaler Commonly known as: PROVENTIL  HFA;VENTOLIN  HFA Inhale 2 puffs four (4) times a day as needed.   aspirin  81 MG tablet Commonly known as: ECOTRIN Take 81 mg by mouth daily.   atorvastatin  80 MG tablet Commonly known as: LIPITOR  Take 80 mg by mouth in the morning.   carvediloL  12.5 MG tablet Commonly known as: COREG  Take 12.5 mg by mouth two (2) times a day.   DULoxetine  30 MG capsule Commonly known as: CYMBALTA  Take 30 mg by mouth in the morning.   hydrOXYzine 25 MG tablet Commonly known as: ATARAX Take 50 mg by mouth at bedtime.   lisinopriL  2.5 MG tablet Commonly known as: PRINIVIL ,ZESTRIL  Take 2.5 mg by mouth daily.   metFORMIN  500 MG tablet Commonly known as: GLUCOPHAGE  Take 500 mg by mouth in the morning.   oxyCODONE -acetaminophen  5-325 mg per tablet Commonly known as: PERCOCET Take 1 tablet by mouth every eight (8) hours as needed.   umeclidinium-vilanteroL 62.5-25 mcg/actuation inhaler Commonly known as: ANORO ELLIPTA  Inhale 1 puff  in the morning.       Labs: Blood Recent Labs  Lab Units 11/16/20 0529 11/15/20 0442 11/14/20 0508 11/12/20 0514 11/11/20 0600 11/10/20 0657  WBC 10*9/L 15.2* 20.9* 20.2* 14.5* 11.6* 13.4*  HEMOGLOBIN g/dL 9.9* 89.9* 9.7* 9.4* 9.7* 9.5*  HEMATOCRIT % 32.9* 32.6* 31.0* 30.3* 30.8* 29.2*  PLATELET COUNT (1) 10*9/L 162 240 243 269 285 312   Recent Labs  Lab Units 11/14/20 0508 11/12/20 0606 11/11/20 0611 11/10/20 0657  SODIUM mmol/L 137 137 136 132*  POTASSIUM mmol/L 4.8 4.6 5.1* 4.0  CHLORIDE mmol/L 103 105 99 98  CO2 mmol/L 27.5 26.9 32.6* 30.8  BUN mg/dL 43* 39* 47* 36*  CREATININE mg/dL 8.91 8.88* 8.36* 8.60*  GLUCOSE mg/dL 847 819* 712* 780*  CALCIUM  mg/dL 8.7 8.1* 8.0* 7.8*    No results in the last week No results in the last week Recent Labs  Lab Units 11/10/20 0657  HEMOGLOBIN A1C % 7.7*    No results in the last week Urine No results in the last week No results in the last week Body Fluids No results in the last week ABG No results for input(s): O2SOUR, FIO2ART, PHART, PCO2ART, PO2ART, HCO3ART, O2SATART, BEART in the last 72 hours. Microbiology Results (last day)    ** No results found for the last 24 hours. **  Radiology: ECG 12 Lead  Result Date: 11/09/2020 Normal sinus rhythm Normal ECG No previous ECGs available  XR Chest Portable  Result Date: 11/14/2020 CLINICAL DATA:  Productive cough. EXAM: PORTABLE CHEST 1 VIEW COMPARISON:  November 07, 2020. FINDINGS: The heart size and mediastinal contours are within normal limits. Both lungs are clear. Status post coronary bypass graft. The visualized skeletal structures are unremarkable.   No active disease. Electronically Signed   By: Lynwood Landy Raddle M.D.   On: 11/14/2020 11:27   XR Chest Portable  Result Date: 11/07/2020 CLINICAL DATA:  Cough, dyspnea EXAM: PORTABLE CHEST 1 VIEW COMPARISON:  02/24/2013 FINDINGS: Lungs are well expanded, symmetric, and clear. No pneumothorax or pleural effusion. Cardiac size within normal limits. Coronary artery bypass grafting has been performed. Pulmonary vascularity is normal. Osseous structures are age-appropriate. No acute bone abnormality.   No active disease. Electronically Signed   By: Dorethia Molt M.D.   On: 11/07/2020 19:56    Discharge Instructions:  Activity Instructions    Activity as tolerated       Diet Instructions    Discharge diet (specify)     Discharge Nutrition Therapy: Consistent Carb   Consistent Carb Level: Consistent Carb 45/45/45 (3/3/3)     Other Instructions    Call MD for:  difficulty breathing, headache or visual disturbances     Call MD for:  persistent nausea or vomiting     Call MD for:  severe uncontrolled  pain     Call MD for: Temperature > 38.5 Celsius ( > 101.3 Fahrenheit)         Resources and Referrals    Hospital Bed for Home Use - HME     AMB HME Vendor Choice? (Outside HME Orders Do Not Route): Outside HME - Manual Process (Choose Class: External)   Start of care date: 11/14/2020   Qualifying diagnosis for this order: COPD   Type of hospital bed: Semi-electric   Length of need in months (99=Lifetime): 99   Please deliver to [address] [place], [place]       Please deliver to [address] [place], [place]   Commode chair     Type of commode: Chair   Please deliver to [facility] Room 225       Please deliver to [facility] Room 225   Nebulizers (DME)     Supplies:  Pro Neb Jet Style Administration Kit Mouth Piece     Length of Need: 99 months   Oxygen  (DME)     Oxygen  Type: Continuous   % saturation on room air at rest: 91 Comment - DROPPED TO 86 WITH EXERTION, TOOK FEW MINUTES TO RECOVER 3 LPM [place] ADDED AND UP TO 95%   Provide Continous Oxygen  at (LPM): 3   Oxygen  device delivery method: Nasal Cannula   Type of portable O2 tank and concentrator to deliver: Gaseous   Other Orders: N/A   Length of Need: 99 MONTHS   Qualifying SAT levels must be within 48 hours of discharge  Additional Information:  Discharge planned for tomorrow       Qualifying SAT levels must be within 48 hours of discharge  Additional Information:  Discharge planned for tomorrow   Oxygen  (DME)     Oxygen  Type: Continuous   % saturation on room air at rest: 91   Provide Continous Oxygen  at (LPM): 3   Oxygen  device delivery method: Nasal Cannula   Type of portable O2 tank and concentrator  to deliver: Gaseous   Other Orders: Optional Systems for Oxygen  delivery   Optional System for Delivery: Eval for Oxygen  Conserving Device System   Provide: Lightweight portable tank, carry bag, appropriate conserving device   Maintain SaO2 (greater than or = to)%: 92   Length of Need: 99 months    Qualifying SAT levels must be within 48 hours of discharge  Additional Information:  Please deliver to [facility] Room 225       Qualifying SAT levels must be within 48 hours of discharge  Additional Information:  Please deliver to [facility] Room 225      Leta KATHEE Fear, MD

## 2020-11-23 DIAGNOSIS — E1165 Type 2 diabetes mellitus with hyperglycemia: Secondary | ICD-10-CM | POA: Diagnosis not present

## 2020-11-23 DIAGNOSIS — Z299 Encounter for prophylactic measures, unspecified: Secondary | ICD-10-CM | POA: Diagnosis not present

## 2020-11-23 DIAGNOSIS — R69 Illness, unspecified: Secondary | ICD-10-CM | POA: Diagnosis not present

## 2020-11-23 DIAGNOSIS — I10 Essential (primary) hypertension: Secondary | ICD-10-CM | POA: Diagnosis not present

## 2020-11-23 DIAGNOSIS — J449 Chronic obstructive pulmonary disease, unspecified: Secondary | ICD-10-CM | POA: Diagnosis not present

## 2020-11-26 DIAGNOSIS — E039 Hypothyroidism, unspecified: Secondary | ICD-10-CM | POA: Diagnosis not present

## 2020-11-26 DIAGNOSIS — E7800 Pure hypercholesterolemia, unspecified: Secondary | ICD-10-CM | POA: Diagnosis not present

## 2020-11-26 DIAGNOSIS — E1165 Type 2 diabetes mellitus with hyperglycemia: Secondary | ICD-10-CM | POA: Diagnosis not present

## 2020-11-26 DIAGNOSIS — J449 Chronic obstructive pulmonary disease, unspecified: Secondary | ICD-10-CM | POA: Diagnosis not present

## 2020-11-29 DIAGNOSIS — R531 Weakness: Secondary | ICD-10-CM | POA: Diagnosis not present

## 2020-11-29 DIAGNOSIS — J9611 Chronic respiratory failure with hypoxia: Secondary | ICD-10-CM | POA: Diagnosis not present

## 2020-11-29 DIAGNOSIS — J449 Chronic obstructive pulmonary disease, unspecified: Secondary | ICD-10-CM | POA: Diagnosis not present

## 2020-11-29 DIAGNOSIS — Z515 Encounter for palliative care: Secondary | ICD-10-CM | POA: Diagnosis not present

## 2020-11-29 DIAGNOSIS — Z87891 Personal history of nicotine dependence: Secondary | ICD-10-CM | POA: Diagnosis not present

## 2020-12-04 DIAGNOSIS — E785 Hyperlipidemia, unspecified: Secondary | ICD-10-CM | POA: Diagnosis not present

## 2020-12-04 DIAGNOSIS — E119 Type 2 diabetes mellitus without complications: Secondary | ICD-10-CM | POA: Diagnosis not present

## 2020-12-04 DIAGNOSIS — M47816 Spondylosis without myelopathy or radiculopathy, lumbar region: Secondary | ICD-10-CM | POA: Diagnosis not present

## 2020-12-04 DIAGNOSIS — I10 Essential (primary) hypertension: Secondary | ICD-10-CM | POA: Diagnosis not present

## 2020-12-04 DIAGNOSIS — I252 Old myocardial infarction: Secondary | ICD-10-CM | POA: Diagnosis not present

## 2020-12-04 DIAGNOSIS — I2510 Atherosclerotic heart disease of native coronary artery without angina pectoris: Secondary | ICD-10-CM | POA: Diagnosis not present

## 2020-12-04 DIAGNOSIS — G8929 Other chronic pain: Secondary | ICD-10-CM | POA: Diagnosis not present

## 2020-12-04 DIAGNOSIS — J9611 Chronic respiratory failure with hypoxia: Secondary | ICD-10-CM | POA: Diagnosis not present

## 2020-12-04 DIAGNOSIS — J449 Chronic obstructive pulmonary disease, unspecified: Secondary | ICD-10-CM | POA: Diagnosis not present

## 2020-12-04 DIAGNOSIS — R531 Weakness: Secondary | ICD-10-CM | POA: Diagnosis not present

## 2020-12-07 DIAGNOSIS — I252 Old myocardial infarction: Secondary | ICD-10-CM | POA: Diagnosis not present

## 2020-12-07 DIAGNOSIS — M47816 Spondylosis without myelopathy or radiculopathy, lumbar region: Secondary | ICD-10-CM | POA: Diagnosis not present

## 2020-12-07 DIAGNOSIS — I10 Essential (primary) hypertension: Secondary | ICD-10-CM | POA: Diagnosis not present

## 2020-12-07 DIAGNOSIS — I2510 Atherosclerotic heart disease of native coronary artery without angina pectoris: Secondary | ICD-10-CM | POA: Diagnosis not present

## 2020-12-07 DIAGNOSIS — G8929 Other chronic pain: Secondary | ICD-10-CM | POA: Diagnosis not present

## 2020-12-07 DIAGNOSIS — J449 Chronic obstructive pulmonary disease, unspecified: Secondary | ICD-10-CM | POA: Diagnosis not present

## 2020-12-07 DIAGNOSIS — J9611 Chronic respiratory failure with hypoxia: Secondary | ICD-10-CM | POA: Diagnosis not present

## 2020-12-07 DIAGNOSIS — E119 Type 2 diabetes mellitus without complications: Secondary | ICD-10-CM | POA: Diagnosis not present

## 2020-12-07 DIAGNOSIS — E785 Hyperlipidemia, unspecified: Secondary | ICD-10-CM | POA: Diagnosis not present

## 2020-12-07 DIAGNOSIS — R531 Weakness: Secondary | ICD-10-CM | POA: Diagnosis not present

## 2020-12-14 DIAGNOSIS — E785 Hyperlipidemia, unspecified: Secondary | ICD-10-CM | POA: Diagnosis not present

## 2020-12-14 DIAGNOSIS — I2510 Atherosclerotic heart disease of native coronary artery without angina pectoris: Secondary | ICD-10-CM | POA: Diagnosis not present

## 2020-12-14 DIAGNOSIS — M47816 Spondylosis without myelopathy or radiculopathy, lumbar region: Secondary | ICD-10-CM | POA: Diagnosis not present

## 2020-12-14 DIAGNOSIS — J449 Chronic obstructive pulmonary disease, unspecified: Secondary | ICD-10-CM | POA: Diagnosis not present

## 2020-12-14 DIAGNOSIS — J441 Chronic obstructive pulmonary disease with (acute) exacerbation: Secondary | ICD-10-CM | POA: Diagnosis not present

## 2020-12-14 DIAGNOSIS — J9621 Acute and chronic respiratory failure with hypoxia: Secondary | ICD-10-CM | POA: Diagnosis not present

## 2020-12-14 DIAGNOSIS — G8929 Other chronic pain: Secondary | ICD-10-CM | POA: Diagnosis not present

## 2020-12-14 DIAGNOSIS — E119 Type 2 diabetes mellitus without complications: Secondary | ICD-10-CM | POA: Diagnosis not present

## 2020-12-14 DIAGNOSIS — I252 Old myocardial infarction: Secondary | ICD-10-CM | POA: Diagnosis not present

## 2020-12-14 DIAGNOSIS — J9611 Chronic respiratory failure with hypoxia: Secondary | ICD-10-CM | POA: Diagnosis not present

## 2020-12-14 DIAGNOSIS — I10 Essential (primary) hypertension: Secondary | ICD-10-CM | POA: Diagnosis not present

## 2020-12-14 DIAGNOSIS — R531 Weakness: Secondary | ICD-10-CM | POA: Diagnosis not present

## 2020-12-16 DIAGNOSIS — J9621 Acute and chronic respiratory failure with hypoxia: Secondary | ICD-10-CM | POA: Diagnosis not present

## 2020-12-16 DIAGNOSIS — J441 Chronic obstructive pulmonary disease with (acute) exacerbation: Secondary | ICD-10-CM | POA: Diagnosis not present

## 2020-12-17 DIAGNOSIS — I2510 Atherosclerotic heart disease of native coronary artery without angina pectoris: Secondary | ICD-10-CM | POA: Diagnosis not present

## 2020-12-17 DIAGNOSIS — E785 Hyperlipidemia, unspecified: Secondary | ICD-10-CM | POA: Diagnosis not present

## 2020-12-17 DIAGNOSIS — I10 Essential (primary) hypertension: Secondary | ICD-10-CM | POA: Diagnosis not present

## 2020-12-17 DIAGNOSIS — M47816 Spondylosis without myelopathy or radiculopathy, lumbar region: Secondary | ICD-10-CM | POA: Diagnosis not present

## 2020-12-17 DIAGNOSIS — J449 Chronic obstructive pulmonary disease, unspecified: Secondary | ICD-10-CM | POA: Diagnosis not present

## 2020-12-17 DIAGNOSIS — E119 Type 2 diabetes mellitus without complications: Secondary | ICD-10-CM | POA: Diagnosis not present

## 2020-12-17 DIAGNOSIS — I252 Old myocardial infarction: Secondary | ICD-10-CM | POA: Diagnosis not present

## 2020-12-17 DIAGNOSIS — J9611 Chronic respiratory failure with hypoxia: Secondary | ICD-10-CM | POA: Diagnosis not present

## 2020-12-17 DIAGNOSIS — G8929 Other chronic pain: Secondary | ICD-10-CM | POA: Diagnosis not present

## 2020-12-17 DIAGNOSIS — R531 Weakness: Secondary | ICD-10-CM | POA: Diagnosis not present

## 2020-12-19 DIAGNOSIS — M47816 Spondylosis without myelopathy or radiculopathy, lumbar region: Secondary | ICD-10-CM | POA: Diagnosis not present

## 2020-12-19 DIAGNOSIS — I2510 Atherosclerotic heart disease of native coronary artery without angina pectoris: Secondary | ICD-10-CM | POA: Diagnosis not present

## 2020-12-19 DIAGNOSIS — J9611 Chronic respiratory failure with hypoxia: Secondary | ICD-10-CM | POA: Diagnosis not present

## 2020-12-19 DIAGNOSIS — J449 Chronic obstructive pulmonary disease, unspecified: Secondary | ICD-10-CM | POA: Diagnosis not present

## 2020-12-19 DIAGNOSIS — I10 Essential (primary) hypertension: Secondary | ICD-10-CM | POA: Diagnosis not present

## 2020-12-19 DIAGNOSIS — R531 Weakness: Secondary | ICD-10-CM | POA: Diagnosis not present

## 2020-12-19 DIAGNOSIS — E785 Hyperlipidemia, unspecified: Secondary | ICD-10-CM | POA: Diagnosis not present

## 2020-12-19 DIAGNOSIS — I252 Old myocardial infarction: Secondary | ICD-10-CM | POA: Diagnosis not present

## 2020-12-19 DIAGNOSIS — E119 Type 2 diabetes mellitus without complications: Secondary | ICD-10-CM | POA: Diagnosis not present

## 2020-12-19 DIAGNOSIS — G8929 Other chronic pain: Secondary | ICD-10-CM | POA: Diagnosis not present

## 2020-12-23 DIAGNOSIS — J9621 Acute and chronic respiratory failure with hypoxia: Secondary | ICD-10-CM | POA: Diagnosis not present

## 2020-12-23 DIAGNOSIS — J441 Chronic obstructive pulmonary disease with (acute) exacerbation: Secondary | ICD-10-CM | POA: Diagnosis not present

## 2020-12-24 DIAGNOSIS — Z7189 Other specified counseling: Secondary | ICD-10-CM | POA: Diagnosis not present

## 2020-12-24 DIAGNOSIS — E039 Hypothyroidism, unspecified: Secondary | ICD-10-CM | POA: Diagnosis not present

## 2020-12-24 DIAGNOSIS — Z79899 Other long term (current) drug therapy: Secondary | ICD-10-CM | POA: Diagnosis not present

## 2020-12-24 DIAGNOSIS — Z1331 Encounter for screening for depression: Secondary | ICD-10-CM | POA: Diagnosis not present

## 2020-12-24 DIAGNOSIS — E7800 Pure hypercholesterolemia, unspecified: Secondary | ICD-10-CM | POA: Diagnosis not present

## 2020-12-24 DIAGNOSIS — R69 Illness, unspecified: Secondary | ICD-10-CM | POA: Diagnosis not present

## 2020-12-24 DIAGNOSIS — Z299 Encounter for prophylactic measures, unspecified: Secondary | ICD-10-CM | POA: Diagnosis not present

## 2020-12-24 DIAGNOSIS — Z6824 Body mass index (BMI) 24.0-24.9, adult: Secondary | ICD-10-CM | POA: Diagnosis not present

## 2020-12-24 DIAGNOSIS — F17210 Nicotine dependence, cigarettes, uncomplicated: Secondary | ICD-10-CM | POA: Diagnosis not present

## 2020-12-24 DIAGNOSIS — Z1339 Encounter for screening examination for other mental health and behavioral disorders: Secondary | ICD-10-CM | POA: Diagnosis not present

## 2020-12-24 DIAGNOSIS — I10 Essential (primary) hypertension: Secondary | ICD-10-CM | POA: Diagnosis not present

## 2020-12-24 DIAGNOSIS — Z0000 Encounter for general adult medical examination without abnormal findings: Secondary | ICD-10-CM | POA: Diagnosis not present

## 2020-12-26 DIAGNOSIS — E039 Hypothyroidism, unspecified: Secondary | ICD-10-CM | POA: Diagnosis not present

## 2020-12-26 DIAGNOSIS — J449 Chronic obstructive pulmonary disease, unspecified: Secondary | ICD-10-CM | POA: Diagnosis not present

## 2020-12-26 DIAGNOSIS — E1165 Type 2 diabetes mellitus with hyperglycemia: Secondary | ICD-10-CM | POA: Diagnosis not present

## 2020-12-26 DIAGNOSIS — E7800 Pure hypercholesterolemia, unspecified: Secondary | ICD-10-CM | POA: Diagnosis not present

## 2020-12-27 ENCOUNTER — Encounter: Payer: Self-pay | Admitting: *Deleted

## 2020-12-27 DIAGNOSIS — M47816 Spondylosis without myelopathy or radiculopathy, lumbar region: Secondary | ICD-10-CM | POA: Diagnosis not present

## 2020-12-27 DIAGNOSIS — G8929 Other chronic pain: Secondary | ICD-10-CM | POA: Diagnosis not present

## 2020-12-27 DIAGNOSIS — J449 Chronic obstructive pulmonary disease, unspecified: Secondary | ICD-10-CM | POA: Diagnosis not present

## 2020-12-27 DIAGNOSIS — E119 Type 2 diabetes mellitus without complications: Secondary | ICD-10-CM | POA: Diagnosis not present

## 2020-12-27 DIAGNOSIS — I252 Old myocardial infarction: Secondary | ICD-10-CM | POA: Diagnosis not present

## 2020-12-27 DIAGNOSIS — I10 Essential (primary) hypertension: Secondary | ICD-10-CM | POA: Diagnosis not present

## 2020-12-27 DIAGNOSIS — I2510 Atherosclerotic heart disease of native coronary artery without angina pectoris: Secondary | ICD-10-CM | POA: Diagnosis not present

## 2020-12-27 DIAGNOSIS — R531 Weakness: Secondary | ICD-10-CM | POA: Diagnosis not present

## 2020-12-27 DIAGNOSIS — J9611 Chronic respiratory failure with hypoxia: Secondary | ICD-10-CM | POA: Diagnosis not present

## 2020-12-27 DIAGNOSIS — Z515 Encounter for palliative care: Secondary | ICD-10-CM | POA: Diagnosis not present

## 2020-12-27 DIAGNOSIS — E785 Hyperlipidemia, unspecified: Secondary | ICD-10-CM | POA: Diagnosis not present

## 2020-12-28 ENCOUNTER — Other Ambulatory Visit: Payer: Self-pay

## 2020-12-28 ENCOUNTER — Ambulatory Visit: Payer: Medicare HMO | Admitting: Cardiology

## 2020-12-28 ENCOUNTER — Encounter: Payer: Self-pay | Admitting: Cardiology

## 2020-12-28 VITALS — BP 120/64 | HR 74 | Ht 64.0 in | Wt 131.4 lb

## 2020-12-28 DIAGNOSIS — I2510 Atherosclerotic heart disease of native coronary artery without angina pectoris: Secondary | ICD-10-CM

## 2020-12-28 DIAGNOSIS — E782 Mixed hyperlipidemia: Secondary | ICD-10-CM

## 2020-12-28 DIAGNOSIS — R0602 Shortness of breath: Secondary | ICD-10-CM | POA: Diagnosis not present

## 2020-12-28 MED ORDER — BISOPROLOL FUMARATE 5 MG PO TABS: 5.0000 mg | ORAL_TABLET | Freq: Every day | ORAL | 6 refills | Status: DC

## 2020-12-28 NOTE — Progress Notes (Signed)
Clinical Summary [name] is a 70 y.o.female seen today for follow up of the following medical problems.    1. CAD   - prior STEMI 12/2012, found to have severe multivessel disease on cath. Had emergent CABG 4 vessel (LIMA-LAD, SVG to ramus, SVG to LCX, SVG to RCA. LVEF 40% by LV gram   - repeat echo 04/2013 showed normalized LVEF, 55-60%     - no recent chest pain. Some SOB at times, coughing/wheezing at times.  - chest pains   2. Hyperlipidemia   - Prior side effects on crestor, tolerating atorvastatin well.    -labs followed by pcp   3. HTN - compliant with meds   4. COPD - PFTs 10/2014 with severe COPD -followed by pcp   10/2020 admission with COPD exacerbation to [facility] - discharge notes discuss hospice/palliative care for end stage COPD however patient did not commit at the time.  -pro BNP was 9500, CXR was clear  - started on home O2 since discharge, on 3L  -   5. Anemia - noted during 10/2020 admission. Received 2 units pRBCs     Husband passed from [place] in January    Past Medical History:  Diagnosis Date   Anxiety    Arthritis    Chronic back pain    Complication of anesthesia    Depression    Depression    Hypertension    Myocardial infarction ([place])    x2.   PONV (postoperative nausea and vomiting)      Allergies  Allergen Reactions   Black Pepper [Piper] Hives, Itching and Rash   Other Hives, Itching, Rash and Other (See Comments)    METALS   Tape Hives, Itching and Rash     Current Outpatient Medications  Medication Sig Dispense Refill   albuterol (PROVENTIL HFA;VENTOLIN HFA) 108 (90 Base) MCG/ACT inhaler Inhale 2 puffs into the lungs every 6 (six) hours as needed for wheezing or shortness of breath.     ANORO ELLIPTA 62.5-25 MCG/INH AEPB Inhale 1 puff into the lungs daily.      aspirin EC 81 MG tablet Take 81 mg by mouth daily.     atorvastatin (LIPITOR) 80 MG tablet Take 80 mg by mouth daily.     carvedilol (COREG)  12.5 MG tablet Take 12.5 mg by mouth 2 (two) times daily.     gabapentin (NEURONTIN) 300 MG capsule Take 300 mg by mouth 3 (three) times daily.     HYDROcodone-acetaminophen (NORCO) 7.5-325 MG tablet Take 1 tablet by mouth 4 (four) times daily.     hydrOXYzine (ATARAX/VISTARIL) 25 MG tablet Take 25 mg by mouth at bedtime.     lisinopril (PRINIVIL,ZESTRIL) 2.5 MG tablet TAKE 1 TABLET BY MOUTH ONCE DAILY 90 tablet 0   metFORMIN (GLUCOPHAGE) 500 MG tablet Take 500 mg by mouth daily with breakfast.     venlafaxine XR (EFFEXOR-XR) 75 MG 24 hr capsule Take 75 mg by mouth daily.     No current facility-administered medications for this visit.     Past Surgical History:  Procedure Laterality Date   ABDOMINAL HYSTERECTOMY     APPENDECTOMY     BACK SURGERY     x3; fusion with pins and rods   BREAST LUMPECTOMY Left    CHOLECYSTECTOMY     COLONOSCOPY N/A 11/18/2013   Procedure: COLONOSCOPY;  Surgeon: Rogene Houston, MD;  [facility];  Service: Endoscopy;  Laterality: N/A;  1200   CORONARY  ARTERY BYPASS GRAFT N/A 01/22/2013   Procedure: Coronary Artery Bypass Grafting Times Four Using Left Internal Mammary Artery and Right Saphenous Leg Vein Harvested Endoscopically;  Surgeon: Grace Isaac, MD;  Location: [place];  Service: Open Heart Surgery;  Laterality: N/A;   ESOPHAGOGASTRODUODENOSCOPY (EGD) WITH PROPOFOL N/A 11/23/2015   Procedure: ESOPHAGOGASTRODUODENOSCOPY (EGD) WITH PROPOFOL;  Surgeon: Rogene Houston, MD;  [facility];  Service: Endoscopy;  Laterality: N/A;  12:35   EXPLORATORY LAPAROTOMY     LEFT HEART CATHETERIZATION WITH CORONARY ANGIOGRAM  01/22/2013   Procedure: LEFT HEART CATHETERIZATION WITH CORONARY ANGIOGRAM;  Surgeon: Lorretta Harp, MD;  Location: [facility] CATH LAB;  Service: Cardiovascular;;   ORIF ANKLE FRACTURE Left    SPINAL FUSION     x 2   TONSILLECTOMY     age 58   TUBAL LIGATION       Allergies  Allergen Reactions   Black Pepper [Piper]  Hives, Itching and Rash   Other Hives, Itching, Rash and Other (See Comments)    METALS   Tape Hives, Itching and Rash      Family History  Problem Relation Age of Onset   Alcohol abuse Father    Schizophrenia Father    Alcohol abuse Sister    Alcohol abuse Brother    Heart disease Other      Social History [name] reports that she has been smoking cigarettes and e-cigarettes. She started smoking about 55 years ago. She has a 25.00 pack-year smoking history. She has never used smokeless tobacco. [name] reports no history of alcohol use.   Review of Systems CONSTITUTIONAL: No weight loss, fever, chills, weakness or fatigue.  HEENT: Eyes: No visual loss, blurred vision, double vision or yellow sclerae.No hearing loss, sneezing, congestion, runny nose or sore throat.  SKIN: No rash or itching.  CARDIOVASCULAR: per hpi RESPIRATORY: per hpi GASTROINTESTINAL: No anorexia, nausea, vomiting or diarrhea. No abdominal pain or blood.  GENITOURINARY: No burning on urination, no polyuria NEUROLOGICAL: No headache, dizziness, syncope, paralysis, ataxia, numbness or tingling in the extremities. No change in bowel or bladder control.  MUSCULOSKELETAL: No muscle, back pain, joint pain or stiffness.  LYMPHATICS: No enlarged nodes. No history of splenectomy.  PSYCHIATRIC: No history of depression or anxiety.  ENDOCRINOLOGIC: No reports of sweating, cold or heat intolerance. No polyuria or polydipsia.  [name]   Physical Examination Today's Vitals   12/28/20 1506  BP: 120/64  Pulse: 74  SpO2: 99%  Weight: 131 lb 6.4 oz (59.6 kg)  Height: 5\' 4"  (1.626 m)   Body mass index is 22.55 kg/m.  Gen: resting comfortably, no acute distress HEENT: no scleral icterus, pupils equal round and reactive, no palptable cervical adenopathy,  CV: RRR, no mr/ gno jvd Resp: bilateral wheezing GI: abdomen is soft, non-tender, non-distended, normal bowel sounds, no hepatosplenomegaly MSK:  extremities are warm, no edema.  Skin: warm, no rash Neuro:  no focal deficits Psych: appropriate affect   Diagnostic Studies   04/2013 Echo LVEF 40-97%, grade I diastolic dysfunction, mild MR   10/2014 PFTs: severe COPD  Assessment and Plan   1. CAD - no recent symptoms - with worsening COPD change coreg to bisoprolol 5mg  daily   2. Hyperlipidemia   - continue atorvastatin, request labs from pcp   3. COPD - per pcp, recent admission with exacerbation. Discharged on home O2 - interesttingly very high proBNP that admit, CXR was clear. She does not appear volume overladed by exam today.  Will repeat echo to see if any dysfunction could be playing a role in her SOB. May repeat bnp in near future.      Arnoldo Lenis, M.D.

## 2020-12-28 NOTE — Patient Instructions (Signed)
Medication Instructions:  Stop Coreg (Carvedilol) Begin Bisoprolol 5mg  daily  Continue all other medications.     Labwork: none  Testing/Procedures: Your physician has requested that you have an echocardiogram. Echocardiography is a painless test that uses sound waves to create images of your heart. It provides your doctor with information about the size and shape of your heart and how well your heart's chambers and valves are working. This procedure takes approximately one hour. There are no restrictions for this procedure. Office will contact with results via phone or letter.     Follow-Up: Your physician wants you to follow up in: 6 months.  You will receive a reminder letter in the mail one-two months in advance.  If you don't receive a letter, please call our office to schedule the follow up appointment   Any Other Special Instructions Will Be Listed Below (If Applicable).   If you need a refill on your cardiac medications before your next appointment, please call your pharmacy.

## 2020-12-31 ENCOUNTER — Encounter: Payer: Self-pay | Admitting: *Deleted

## 2020-12-31 DIAGNOSIS — M5459 Other low back pain: Secondary | ICD-10-CM | POA: Diagnosis not present

## 2020-12-31 DIAGNOSIS — R69 Illness, unspecified: Secondary | ICD-10-CM | POA: Diagnosis not present

## 2020-12-31 DIAGNOSIS — I10 Essential (primary) hypertension: Secondary | ICD-10-CM | POA: Diagnosis not present

## 2020-12-31 DIAGNOSIS — G894 Chronic pain syndrome: Secondary | ICD-10-CM | POA: Diagnosis not present

## 2020-12-31 DIAGNOSIS — Z79891 Long term (current) use of opiate analgesic: Secondary | ICD-10-CM | POA: Diagnosis not present

## 2020-12-31 DIAGNOSIS — M961 Postlaminectomy syndrome, not elsewhere classified: Secondary | ICD-10-CM | POA: Diagnosis not present

## 2020-12-31 DIAGNOSIS — R269 Unspecified abnormalities of gait and mobility: Secondary | ICD-10-CM | POA: Diagnosis not present

## 2021-01-02 DIAGNOSIS — E785 Hyperlipidemia, unspecified: Secondary | ICD-10-CM | POA: Diagnosis not present

## 2021-01-02 DIAGNOSIS — G8929 Other chronic pain: Secondary | ICD-10-CM | POA: Diagnosis not present

## 2021-01-02 DIAGNOSIS — I10 Essential (primary) hypertension: Secondary | ICD-10-CM | POA: Diagnosis not present

## 2021-01-02 DIAGNOSIS — R531 Weakness: Secondary | ICD-10-CM | POA: Diagnosis not present

## 2021-01-02 DIAGNOSIS — I252 Old myocardial infarction: Secondary | ICD-10-CM | POA: Diagnosis not present

## 2021-01-02 DIAGNOSIS — M47816 Spondylosis without myelopathy or radiculopathy, lumbar region: Secondary | ICD-10-CM | POA: Diagnosis not present

## 2021-01-02 DIAGNOSIS — I2510 Atherosclerotic heart disease of native coronary artery without angina pectoris: Secondary | ICD-10-CM | POA: Diagnosis not present

## 2021-01-02 DIAGNOSIS — J449 Chronic obstructive pulmonary disease, unspecified: Secondary | ICD-10-CM | POA: Diagnosis not present

## 2021-01-02 DIAGNOSIS — E119 Type 2 diabetes mellitus without complications: Secondary | ICD-10-CM | POA: Diagnosis not present

## 2021-01-02 DIAGNOSIS — J9611 Chronic respiratory failure with hypoxia: Secondary | ICD-10-CM | POA: Diagnosis not present

## 2021-01-04 DIAGNOSIS — E119 Type 2 diabetes mellitus without complications: Secondary | ICD-10-CM | POA: Diagnosis not present

## 2021-01-04 DIAGNOSIS — I2510 Atherosclerotic heart disease of native coronary artery without angina pectoris: Secondary | ICD-10-CM | POA: Diagnosis not present

## 2021-01-04 DIAGNOSIS — I252 Old myocardial infarction: Secondary | ICD-10-CM | POA: Diagnosis not present

## 2021-01-04 DIAGNOSIS — J449 Chronic obstructive pulmonary disease, unspecified: Secondary | ICD-10-CM | POA: Diagnosis not present

## 2021-01-04 DIAGNOSIS — M47816 Spondylosis without myelopathy or radiculopathy, lumbar region: Secondary | ICD-10-CM | POA: Diagnosis not present

## 2021-01-04 DIAGNOSIS — E785 Hyperlipidemia, unspecified: Secondary | ICD-10-CM | POA: Diagnosis not present

## 2021-01-04 DIAGNOSIS — R531 Weakness: Secondary | ICD-10-CM | POA: Diagnosis not present

## 2021-01-04 DIAGNOSIS — J9611 Chronic respiratory failure with hypoxia: Secondary | ICD-10-CM | POA: Diagnosis not present

## 2021-01-04 DIAGNOSIS — G8929 Other chronic pain: Secondary | ICD-10-CM | POA: Diagnosis not present

## 2021-01-04 DIAGNOSIS — I10 Essential (primary) hypertension: Secondary | ICD-10-CM | POA: Diagnosis not present

## 2021-01-08 DIAGNOSIS — J449 Chronic obstructive pulmonary disease, unspecified: Secondary | ICD-10-CM | POA: Diagnosis not present

## 2021-01-08 DIAGNOSIS — E119 Type 2 diabetes mellitus without complications: Secondary | ICD-10-CM | POA: Diagnosis not present

## 2021-01-08 DIAGNOSIS — R531 Weakness: Secondary | ICD-10-CM | POA: Diagnosis not present

## 2021-01-08 DIAGNOSIS — I2510 Atherosclerotic heart disease of native coronary artery without angina pectoris: Secondary | ICD-10-CM | POA: Diagnosis not present

## 2021-01-08 DIAGNOSIS — G8929 Other chronic pain: Secondary | ICD-10-CM | POA: Diagnosis not present

## 2021-01-08 DIAGNOSIS — J9611 Chronic respiratory failure with hypoxia: Secondary | ICD-10-CM | POA: Diagnosis not present

## 2021-01-08 DIAGNOSIS — E785 Hyperlipidemia, unspecified: Secondary | ICD-10-CM | POA: Diagnosis not present

## 2021-01-08 DIAGNOSIS — M47816 Spondylosis without myelopathy or radiculopathy, lumbar region: Secondary | ICD-10-CM | POA: Diagnosis not present

## 2021-01-08 DIAGNOSIS — I252 Old myocardial infarction: Secondary | ICD-10-CM | POA: Diagnosis not present

## 2021-01-08 DIAGNOSIS — I10 Essential (primary) hypertension: Secondary | ICD-10-CM | POA: Diagnosis not present

## 2021-01-10 DIAGNOSIS — M47816 Spondylosis without myelopathy or radiculopathy, lumbar region: Secondary | ICD-10-CM | POA: Diagnosis not present

## 2021-01-10 DIAGNOSIS — J449 Chronic obstructive pulmonary disease, unspecified: Secondary | ICD-10-CM | POA: Diagnosis not present

## 2021-01-10 DIAGNOSIS — I252 Old myocardial infarction: Secondary | ICD-10-CM | POA: Diagnosis not present

## 2021-01-10 DIAGNOSIS — J9611 Chronic respiratory failure with hypoxia: Secondary | ICD-10-CM | POA: Diagnosis not present

## 2021-01-10 DIAGNOSIS — R531 Weakness: Secondary | ICD-10-CM | POA: Diagnosis not present

## 2021-01-10 DIAGNOSIS — E785 Hyperlipidemia, unspecified: Secondary | ICD-10-CM | POA: Diagnosis not present

## 2021-01-10 DIAGNOSIS — E119 Type 2 diabetes mellitus without complications: Secondary | ICD-10-CM | POA: Diagnosis not present

## 2021-01-10 DIAGNOSIS — I10 Essential (primary) hypertension: Secondary | ICD-10-CM | POA: Diagnosis not present

## 2021-01-10 DIAGNOSIS — G8929 Other chronic pain: Secondary | ICD-10-CM | POA: Diagnosis not present

## 2021-01-10 DIAGNOSIS — I2510 Atherosclerotic heart disease of native coronary artery without angina pectoris: Secondary | ICD-10-CM | POA: Diagnosis not present

## 2021-01-13 DIAGNOSIS — J9621 Acute and chronic respiratory failure with hypoxia: Secondary | ICD-10-CM | POA: Diagnosis not present

## 2021-01-13 DIAGNOSIS — J441 Chronic obstructive pulmonary disease with (acute) exacerbation: Secondary | ICD-10-CM | POA: Diagnosis not present

## 2021-01-15 DIAGNOSIS — J9621 Acute and chronic respiratory failure with hypoxia: Secondary | ICD-10-CM | POA: Diagnosis not present

## 2021-01-15 DIAGNOSIS — J441 Chronic obstructive pulmonary disease with (acute) exacerbation: Secondary | ICD-10-CM | POA: Diagnosis not present

## 2021-01-22 DIAGNOSIS — J9621 Acute and chronic respiratory failure with hypoxia: Secondary | ICD-10-CM | POA: Diagnosis not present

## 2021-01-22 DIAGNOSIS — J441 Chronic obstructive pulmonary disease with (acute) exacerbation: Secondary | ICD-10-CM | POA: Diagnosis not present

## 2021-01-24 DIAGNOSIS — Z515 Encounter for palliative care: Secondary | ICD-10-CM | POA: Diagnosis not present

## 2021-01-24 DIAGNOSIS — R531 Weakness: Secondary | ICD-10-CM | POA: Diagnosis not present

## 2021-01-24 DIAGNOSIS — J449 Chronic obstructive pulmonary disease, unspecified: Secondary | ICD-10-CM | POA: Diagnosis not present

## 2021-01-24 DIAGNOSIS — J9611 Chronic respiratory failure with hypoxia: Secondary | ICD-10-CM | POA: Diagnosis not present

## 2021-02-12 ENCOUNTER — Ambulatory Visit (INDEPENDENT_AMBULATORY_CARE_PROVIDER_SITE_OTHER): Payer: Medicare HMO

## 2021-02-12 DIAGNOSIS — R0602 Shortness of breath: Secondary | ICD-10-CM

## 2021-02-12 LAB — ECHOCARDIOGRAM COMPLETE
AR max vel: 2.02 cm2
AV Area VTI: 1.96 cm2
AV Area mean vel: 1.84 cm2
AV Mean grad: 4 mmHg
AV Peak grad: 6.8 mmHg
Ao pk vel: 1.3 m/s
Area-P 1/2: 2.48 cm2
Calc EF: 59.9 %
S' Lateral: 3.62 cm
Single Plane A2C EF: 65.1 %
Single Plane A4C EF: 52.1 %

## 2021-02-13 DIAGNOSIS — J9621 Acute and chronic respiratory failure with hypoxia: Secondary | ICD-10-CM | POA: Diagnosis not present

## 2021-02-13 DIAGNOSIS — J441 Chronic obstructive pulmonary disease with (acute) exacerbation: Secondary | ICD-10-CM | POA: Diagnosis not present

## 2021-02-15 DIAGNOSIS — J9621 Acute and chronic respiratory failure with hypoxia: Secondary | ICD-10-CM | POA: Diagnosis not present

## 2021-02-15 DIAGNOSIS — J441 Chronic obstructive pulmonary disease with (acute) exacerbation: Secondary | ICD-10-CM | POA: Diagnosis not present

## 2021-02-22 DIAGNOSIS — J441 Chronic obstructive pulmonary disease with (acute) exacerbation: Secondary | ICD-10-CM | POA: Diagnosis not present

## 2021-02-22 DIAGNOSIS — J9621 Acute and chronic respiratory failure with hypoxia: Secondary | ICD-10-CM | POA: Diagnosis not present

## 2021-03-04 ENCOUNTER — Encounter: Payer: Self-pay | Admitting: *Deleted

## 2021-03-04 ENCOUNTER — Telehealth: Payer: Self-pay | Admitting: *Deleted

## 2021-03-04 NOTE — Telephone Encounter (Signed)
-----   Message from Arnoldo Lenis, MD sent at 03/03/2021  5:17 PM EST ----- Echo overall looks good, normal heart function   Zandra Abts MD

## 2021-03-04 NOTE — Telephone Encounter (Signed)
Laurine Blazer, LPN  03/06/3660  9:47 PM EST Back to Top    Patient notified via letter.  Copy to pcp.

## 2021-03-16 DIAGNOSIS — J441 Chronic obstructive pulmonary disease with (acute) exacerbation: Secondary | ICD-10-CM | POA: Diagnosis not present

## 2021-03-16 DIAGNOSIS — J9621 Acute and chronic respiratory failure with hypoxia: Secondary | ICD-10-CM | POA: Diagnosis not present

## 2021-03-18 DIAGNOSIS — J441 Chronic obstructive pulmonary disease with (acute) exacerbation: Secondary | ICD-10-CM | POA: Diagnosis not present

## 2021-03-18 DIAGNOSIS — J9621 Acute and chronic respiratory failure with hypoxia: Secondary | ICD-10-CM | POA: Diagnosis not present

## 2021-03-25 DIAGNOSIS — J441 Chronic obstructive pulmonary disease with (acute) exacerbation: Secondary | ICD-10-CM | POA: Diagnosis not present

## 2021-03-25 DIAGNOSIS — J9621 Acute and chronic respiratory failure with hypoxia: Secondary | ICD-10-CM | POA: Diagnosis not present

## 2021-03-25 DIAGNOSIS — G894 Chronic pain syndrome: Secondary | ICD-10-CM | POA: Diagnosis not present

## 2021-03-25 DIAGNOSIS — Z79891 Long term (current) use of opiate analgesic: Secondary | ICD-10-CM | POA: Diagnosis not present

## 2021-03-25 DIAGNOSIS — I10 Essential (primary) hypertension: Secondary | ICD-10-CM | POA: Diagnosis not present

## 2021-03-25 DIAGNOSIS — R269 Unspecified abnormalities of gait and mobility: Secondary | ICD-10-CM | POA: Diagnosis not present

## 2021-03-25 DIAGNOSIS — M961 Postlaminectomy syndrome, not elsewhere classified: Secondary | ICD-10-CM | POA: Diagnosis not present

## 2021-03-25 DIAGNOSIS — M5459 Other low back pain: Secondary | ICD-10-CM | POA: Diagnosis not present

## 2021-03-26 DIAGNOSIS — J449 Chronic obstructive pulmonary disease, unspecified: Secondary | ICD-10-CM | POA: Diagnosis not present

## 2021-03-26 DIAGNOSIS — E039 Hypothyroidism, unspecified: Secondary | ICD-10-CM | POA: Diagnosis not present

## 2021-03-26 DIAGNOSIS — E1165 Type 2 diabetes mellitus with hyperglycemia: Secondary | ICD-10-CM | POA: Diagnosis not present

## 2021-03-26 DIAGNOSIS — E7800 Pure hypercholesterolemia, unspecified: Secondary | ICD-10-CM | POA: Diagnosis not present

## 2021-04-10 ENCOUNTER — Other Ambulatory Visit: Payer: Self-pay

## 2021-04-13 DIAGNOSIS — J9621 Acute and chronic respiratory failure with hypoxia: Secondary | ICD-10-CM | POA: Diagnosis not present

## 2021-04-13 DIAGNOSIS — J441 Chronic obstructive pulmonary disease with (acute) exacerbation: Secondary | ICD-10-CM | POA: Diagnosis not present

## 2021-04-15 DIAGNOSIS — J9621 Acute and chronic respiratory failure with hypoxia: Secondary | ICD-10-CM | POA: Diagnosis not present

## 2021-04-15 DIAGNOSIS — J441 Chronic obstructive pulmonary disease with (acute) exacerbation: Secondary | ICD-10-CM | POA: Diagnosis not present

## 2021-04-22 DIAGNOSIS — J441 Chronic obstructive pulmonary disease with (acute) exacerbation: Secondary | ICD-10-CM | POA: Diagnosis not present

## 2021-04-22 DIAGNOSIS — J9621 Acute and chronic respiratory failure with hypoxia: Secondary | ICD-10-CM | POA: Diagnosis not present

## 2021-05-08 ENCOUNTER — Ambulatory Visit
Admission: RE | Admit: 2021-05-08 | Discharge: 2021-05-08 | Disposition: A | Discharge status: Home / Self Care | Payer: Medicare HMO | Source: Ambulatory Visit | Attending: Internal Medicine | Admitting: Internal Medicine

## 2021-05-08 DIAGNOSIS — Z1231 Encounter for screening mammogram for malignant neoplasm of breast: Secondary | ICD-10-CM | POA: Diagnosis not present

## 2021-05-14 DIAGNOSIS — J441 Chronic obstructive pulmonary disease with (acute) exacerbation: Secondary | ICD-10-CM | POA: Diagnosis not present

## 2021-05-14 DIAGNOSIS — J9621 Acute and chronic respiratory failure with hypoxia: Secondary | ICD-10-CM | POA: Diagnosis not present

## 2021-05-16 DIAGNOSIS — J9621 Acute and chronic respiratory failure with hypoxia: Secondary | ICD-10-CM | POA: Diagnosis not present

## 2021-05-16 DIAGNOSIS — J441 Chronic obstructive pulmonary disease with (acute) exacerbation: Secondary | ICD-10-CM | POA: Diagnosis not present

## 2021-05-23 DIAGNOSIS — J441 Chronic obstructive pulmonary disease with (acute) exacerbation: Secondary | ICD-10-CM | POA: Diagnosis not present

## 2021-05-23 DIAGNOSIS — J9621 Acute and chronic respiratory failure with hypoxia: Secondary | ICD-10-CM | POA: Diagnosis not present

## 2021-05-26 DIAGNOSIS — J449 Chronic obstructive pulmonary disease, unspecified: Secondary | ICD-10-CM | POA: Diagnosis not present

## 2021-05-26 DIAGNOSIS — E1165 Type 2 diabetes mellitus with hyperglycemia: Secondary | ICD-10-CM | POA: Diagnosis not present

## 2021-05-26 DIAGNOSIS — E039 Hypothyroidism, unspecified: Secondary | ICD-10-CM | POA: Diagnosis not present

## 2021-05-26 DIAGNOSIS — E7800 Pure hypercholesterolemia, unspecified: Secondary | ICD-10-CM | POA: Diagnosis not present

## 2021-05-27 DIAGNOSIS — I10 Essential (primary) hypertension: Secondary | ICD-10-CM | POA: Diagnosis not present

## 2021-05-27 DIAGNOSIS — J441 Chronic obstructive pulmonary disease with (acute) exacerbation: Secondary | ICD-10-CM | POA: Diagnosis not present

## 2021-05-27 DIAGNOSIS — R69 Illness, unspecified: Secondary | ICD-10-CM | POA: Diagnosis not present

## 2021-05-27 DIAGNOSIS — Z299 Encounter for prophylactic measures, unspecified: Secondary | ICD-10-CM | POA: Diagnosis not present

## 2021-05-27 DIAGNOSIS — E1165 Type 2 diabetes mellitus with hyperglycemia: Secondary | ICD-10-CM | POA: Diagnosis not present

## 2021-06-11 DIAGNOSIS — G894 Chronic pain syndrome: Secondary | ICD-10-CM | POA: Diagnosis not present

## 2021-06-11 DIAGNOSIS — Z79891 Long term (current) use of opiate analgesic: Secondary | ICD-10-CM | POA: Diagnosis not present

## 2021-06-11 DIAGNOSIS — M961 Postlaminectomy syndrome, not elsewhere classified: Secondary | ICD-10-CM | POA: Diagnosis not present

## 2021-06-11 DIAGNOSIS — R269 Unspecified abnormalities of gait and mobility: Secondary | ICD-10-CM | POA: Diagnosis not present

## 2021-06-13 DIAGNOSIS — J441 Chronic obstructive pulmonary disease with (acute) exacerbation: Secondary | ICD-10-CM | POA: Diagnosis not present

## 2021-06-13 DIAGNOSIS — J9621 Acute and chronic respiratory failure with hypoxia: Secondary | ICD-10-CM | POA: Diagnosis not present

## 2021-06-15 DIAGNOSIS — J9621 Acute and chronic respiratory failure with hypoxia: Secondary | ICD-10-CM | POA: Diagnosis not present

## 2021-06-15 DIAGNOSIS — J441 Chronic obstructive pulmonary disease with (acute) exacerbation: Secondary | ICD-10-CM | POA: Diagnosis not present

## 2021-06-22 DIAGNOSIS — J9621 Acute and chronic respiratory failure with hypoxia: Secondary | ICD-10-CM | POA: Diagnosis not present

## 2021-06-22 DIAGNOSIS — J441 Chronic obstructive pulmonary disease with (acute) exacerbation: Secondary | ICD-10-CM | POA: Diagnosis not present

## 2021-07-04 ENCOUNTER — Ambulatory Visit: Payer: Medicare HMO | Admitting: Cardiology

## 2021-07-04 ENCOUNTER — Encounter: Payer: Self-pay | Admitting: Cardiology

## 2021-07-04 VITALS — BP 166/70 | HR 62 | Ht 64.0 in | Wt 148.8 lb

## 2021-07-04 DIAGNOSIS — E782 Mixed hyperlipidemia: Secondary | ICD-10-CM | POA: Diagnosis not present

## 2021-07-04 DIAGNOSIS — I2510 Atherosclerotic heart disease of native coronary artery without angina pectoris: Secondary | ICD-10-CM

## 2021-07-04 DIAGNOSIS — I6529 Occlusion and stenosis of unspecified carotid artery: Secondary | ICD-10-CM | POA: Diagnosis not present

## 2021-07-04 NOTE — Patient Instructions (Addendum)
Medication Instructions:  Your physician recommends that you continue on your current medications as directed. Please refer to the Current Medication list given to you today.  Labwork: none  Testing/Procedures: Your physician has requested that you have a carotid duplex. This test is an ultrasound of the carotid arteries in your neck. It looks at blood flow through these arteries that supply the brain with blood. Allow one hour for this exam. There are no restrictions or special instructions.  Follow-Up: Your physician recommends that you schedule a follow-up appointment in: 6 months  Any Other Special Instructions Will Be Listed Below (If Applicable). Nurse visit for blood pressure check   If you need a refill on your cardiac medications before your next appointment, please call your pharmacy.

## 2021-07-04 NOTE — Progress Notes (Signed)
Clinical Summary [name] is a 71 y.o.female seen today for follow up of the following medical problems.    1. CAD   - prior STEMI 12/2012, found to have severe multivessel disease on cath. Had emergent CABG 4 vessel (LIMA-LAD, SVG to ramus, SVG to LCX, SVG to RCA. LVEF 40% by LV gram   - repeat echo 04/2013 showed normalized LVEF, 55-60%    Jan 2023 echo: LVEF 55-60%, grade I dd, normal RV, mild MR, normal IVC - chest pains. Chronic SOB progressing. - no recent edema.  - compliant with meds   2. Hyperlipidemia   - Prior side effects on crestor, tolerating atorvastatin well.    -labs followed by pcp  11/2020 TC 122 TG 141 HDL 42 LDL 55   3. HTN -has not taken meds yet -    4. COPD - PFTs 10/2014 with severe COPD -followed by pcp   10/2020 admission with COPD exacerbation to [facility] - discharge notes discuss hospice/palliative care for end stage COPD however patient did not commit at the time.  -pro BNP was 9500, CXR was clear   - started on home O2 since discharge, on 3L  -    5. Anemia - noted during 10/2020 admission. Received 2 units pRBCs     Husband passed from [place] in January     Past Medical History:  Diagnosis Date   Anxiety    Arthritis    Chronic back pain    Complication of anesthesia    Depression    Depression    Hypertension    Myocardial infarction ([place])    x2.   PONV (postoperative nausea and vomiting)      Allergies  Allergen Reactions   Black Pepper [Piper] Hives, Itching and Rash   Other Hives, Itching, Rash and Other (See Comments)    METALS   Tape Hives, Itching and Rash     Current Outpatient Medications  Medication Sig Dispense Refill   albuterol (PROVENTIL HFA;VENTOLIN HFA) 108 (90 Base) MCG/ACT inhaler Inhale 2 puffs into the lungs every 6 (six) hours as needed for wheezing or shortness of breath.     ALPRAZolam (XANAX) 0.5 MG tablet Take 0.5 mg by mouth 3 (three) times daily as needed.     ANORO ELLIPTA  62.5-25 MCG/INH AEPB Inhale 1 puff into the lungs daily.      aspirin EC 81 MG tablet Take 81 mg by mouth daily.     atorvastatin (LIPITOR) 80 MG tablet Take 80 mg by mouth daily.     bisoprolol (ZEBETA) 5 MG tablet Take 1 tablet (5 mg total) by mouth daily. 30 tablet 6   DULoxetine (CYMBALTA) 30 MG capsule Take 30 mg by mouth 2 (two) times daily.     gabapentin (NEURONTIN) 300 MG capsule Take 300 mg by mouth 3 (three) times daily.     hydrOXYzine (ATARAX/VISTARIL) 25 MG tablet Take 25 mg by mouth at bedtime.     ipratropium-albuterol (DUONEB) 0.5-2.5 (3) MG/3ML SOLN Take 3 mLs by nebulization 4 (four) times daily.     lisinopril (PRINIVIL,ZESTRIL) 2.5 MG tablet TAKE 1 TABLET BY MOUTH ONCE DAILY 90 tablet 0   metFORMIN (GLUCOPHAGE) 500 MG tablet Take 500 mg by mouth daily with breakfast.     oxyCODONE-acetaminophen (PERCOCET/ROXICET) 5-325 MG tablet Take 1 tablet by mouth every 8 (eight) hours.     No current facility-administered medications for this visit.     Past Surgical History:  Procedure Laterality  Date   ABDOMINAL HYSTERECTOMY     APPENDECTOMY     BACK SURGERY     x3; fusion with pins and rods   BREAST LUMPECTOMY Left    CHOLECYSTECTOMY     COLONOSCOPY N/A 11/18/2013   Procedure: COLONOSCOPY;  Surgeon: Rogene Houston, MD;  [facility];  Service: Endoscopy;  Laterality: N/A;  1200   CORONARY ARTERY BYPASS GRAFT N/A 01/22/2013   Procedure: Coronary Artery Bypass Grafting Times Four Using Left Internal Mammary Artery and Right Saphenous Leg Vein Harvested Endoscopically;  Surgeon: Grace Isaac, MD;  Location: [place];  Service: Open Heart Surgery;  Laterality: N/A;   ESOPHAGOGASTRODUODENOSCOPY (EGD) WITH PROPOFOL N/A 11/23/2015   Procedure: ESOPHAGOGASTRODUODENOSCOPY (EGD) WITH PROPOFOL;  Surgeon: Rogene Houston, MD;  [facility];  Service: Endoscopy;  Laterality: N/A;  12:35   EXPLORATORY LAPAROTOMY     LEFT HEART CATHETERIZATION WITH CORONARY  ANGIOGRAM  01/22/2013   Procedure: LEFT HEART CATHETERIZATION WITH CORONARY ANGIOGRAM;  Surgeon: Lorretta Harp, MD;  Location: [facility] CATH LAB;  Service: Cardiovascular;;   ORIF ANKLE FRACTURE Left    SPINAL FUSION     x 2   TONSILLECTOMY     age 16   TUBAL LIGATION       Allergies  Allergen Reactions   Black Pepper [Piper] Hives, Itching and Rash   Other Hives, Itching, Rash and Other (See Comments)    METALS   Tape Hives, Itching and Rash      Family History  Problem Relation Age of Onset   Alcohol abuse Father    Schizophrenia Father    Alcohol abuse Sister    Alcohol abuse Brother    Heart disease Other      Social History [name] reports that she has been smoking cigarettes and e-cigarettes. She started smoking about 56 years ago. She has a 25.00 pack-year smoking history. She has never used smokeless tobacco. [name] reports no history of alcohol use.   Review of Systems CONSTITUTIONAL: No weight loss, fever, chills, weakness or fatigue.  HEENT: Eyes: No visual loss, blurred vision, double vision or yellow sclerae.No hearing loss, sneezing, congestion, runny nose or sore throat.  SKIN: No rash or itching.  CARDIOVASCULAR: per hpi RESPIRATORY: per hpi GASTROINTESTINAL: No anorexia, nausea, vomiting or diarrhea. No abdominal pain or blood.  GENITOURINARY: No burning on urination, no polyuria NEUROLOGICAL: No headache, dizziness, syncope, paralysis, ataxia, numbness or tingling in the extremities. No change in bowel or bladder control.  MUSCULOSKELETAL: No muscle, back pain, joint pain or stiffness.  LYMPHATICS: No enlarged nodes. No history of splenectomy.  PSYCHIATRIC: No history of depression or anxiety.  ENDOCRINOLOGIC: No reports of sweating, cold or heat intolerance. No polyuria or polydipsia.  [name]   Physical Examination Today's Vitals   07/04/21 1406  BP: (!) 166/70  Pulse: 62  SpO2: 93%  Weight: 148 lb 12.8 oz (67.5 kg)  Height: '5\' 4"'$   (1.626 m)   Body mass index is 25.54 kg/m.  Gen: resting comfortably, no acute distress HEENT: no scleral icterus, pupils equal round and reactive, no palptable cervical adenopathy,  CV: RRR, no m/r/g no jvd. +carotid bruits bilateral Resp: Clear to auscultation bilaterally GI: abdomen is soft, non-tender, non-distended, normal bowel sounds, no hepatosplenomegaly MSK: extremities are warm, no edema.  Skin: warm, no rash Neuro:  no focal deficits Psych: appropriate affect   Diagnostic Studies 04/2013 Echo LVEF 17-79%, grade I diastolic dysfunction, mild MR   10/2014  PFTs: severe COPD  Jan 2023 echo 1. Left ventricular ejection fraction, by estimation, is 55 to 60%. The  left ventricle has normal function. The left ventricle demonstrates  regional wall motion abnormalities (see scoring diagram/findings for  description). Left ventricular diastolic  parameters are consistent with Grade I diastolic dysfunction (impaired  relaxation). Mildly reduced global longitudinal strain of -14%.   2. Right ventricular systolic function is normal. The right ventricular  size is normal. Tricuspid regurgitation signal is inadequate for assessing  PA pressure.   3. The mitral valve is grossly normal. Mild mitral valve regurgitation.   4. The aortic valve is tricuspid. Aortic valve regurgitation is not  visualized. Aortic valve sclerosis/calcification is present, without any  evidence of aortic stenosis. Aortic valve mean gradient measures 4.0 mmHg.   5. The inferior vena cava is normal in size with greater than 50%  respiratory variability, suggesting right atrial pressure of 3 mmHg.   Assessment and Plan  1. CAD - no symptoms, continue current meds   2. Hyperlipidemia   - at goal, continue atorvastatin   3. Carotid stenosis - noted bruits bilaterally today. Mild stenosis from prior US, will repeat carotid US   F/u 6 months      Arnoldo Lenis, M.D.

## 2021-07-05 ENCOUNTER — Other Ambulatory Visit: Payer: Self-pay | Admitting: Cardiology

## 2021-07-14 DIAGNOSIS — J441 Chronic obstructive pulmonary disease with (acute) exacerbation: Secondary | ICD-10-CM | POA: Diagnosis not present

## 2021-07-14 DIAGNOSIS — J9621 Acute and chronic respiratory failure with hypoxia: Secondary | ICD-10-CM | POA: Diagnosis not present

## 2021-07-16 DIAGNOSIS — J441 Chronic obstructive pulmonary disease with (acute) exacerbation: Secondary | ICD-10-CM | POA: Diagnosis not present

## 2021-07-16 DIAGNOSIS — J9621 Acute and chronic respiratory failure with hypoxia: Secondary | ICD-10-CM | POA: Diagnosis not present

## 2021-07-23 ENCOUNTER — Ambulatory Visit (INDEPENDENT_AMBULATORY_CARE_PROVIDER_SITE_OTHER): Payer: Medicare HMO

## 2021-07-23 ENCOUNTER — Ambulatory Visit: Payer: Medicare HMO | Admitting: *Deleted

## 2021-07-23 VITALS — BP 158/80 | HR 69

## 2021-07-23 DIAGNOSIS — J441 Chronic obstructive pulmonary disease with (acute) exacerbation: Secondary | ICD-10-CM | POA: Diagnosis not present

## 2021-07-23 DIAGNOSIS — I6529 Occlusion and stenosis of unspecified carotid artery: Secondary | ICD-10-CM

## 2021-07-23 DIAGNOSIS — J9621 Acute and chronic respiratory failure with hypoxia: Secondary | ICD-10-CM | POA: Diagnosis not present

## 2021-07-23 DIAGNOSIS — I6521 Occlusion and stenosis of right carotid artery: Secondary | ICD-10-CM | POA: Diagnosis not present

## 2021-07-23 DIAGNOSIS — I10 Essential (primary) hypertension: Secondary | ICD-10-CM

## 2021-07-23 NOTE — Progress Notes (Signed)
Patient notified and verbalized understanding.   States [name]) takes care of her medications & she is not sure about that dose.  She will have her daughter call back to confirm.  Advised her that she can take 2 tabs of the 2.'5mg'$  tablet to make the '5mg'$  for now if that indeed is what she has.  After we get confirmation from daughter, I will send in new prescription for '5mg'$  tablet.

## 2021-07-23 NOTE — Progress Notes (Signed)
Patient in office for nurse BO check -   158/80  44   Taking all medications as prescribed.    Will also have carotid doppler this evening as well.

## 2021-07-24 ENCOUNTER — Telehealth: Payer: Self-pay | Admitting: Cardiology

## 2021-07-24 MED ORDER — LISINOPRIL 5 MG PO TABS: 5.0000 mg | ORAL_TABLET | Freq: Every day | ORAL | 1 refills | Status: DC

## 2021-07-24 NOTE — Telephone Encounter (Signed)
Per Lattie Haw, daughter, patient takes lisinopril 2.5 mg daily Lisinopril 5 mg daily sent to pharmacy per [place].

## 2021-07-24 NOTE — Telephone Encounter (Signed)
  Pt c/o medication issue:  1. Name of Medication: lisinopril  2. How are you currently taking this medication (dosage and times per day)? Not sure  3. Are you having a reaction (difficulty breathing--STAT)?   4. What is your medication issue?  Lattie Haw pt's daughter calling and would like to ask what is the right dosage of pt's lisinopril

## 2021-08-06 ENCOUNTER — Telehealth: Payer: Self-pay | Admitting: *Deleted

## 2021-08-06 NOTE — Telephone Encounter (Signed)
-----   Message from Arnoldo Lenis, MD sent at 08/04/2021 11:28 AM EDT ----- Mild to moderate blockages on carotid US, just something to monitor at this time  Zandra Abts MD

## 2021-08-06 NOTE — Telephone Encounter (Signed)
Laurine Blazer, LPN  2/92/9090  3:01 PM EDT Back to Top    Notified, copy to pcp.

## 2021-08-13 ENCOUNTER — Other Ambulatory Visit: Payer: Self-pay | Admitting: Cardiology

## 2021-08-13 DIAGNOSIS — I6523 Occlusion and stenosis of bilateral carotid arteries: Secondary | ICD-10-CM

## 2021-08-13 DIAGNOSIS — J9621 Acute and chronic respiratory failure with hypoxia: Secondary | ICD-10-CM | POA: Diagnosis not present

## 2021-08-13 DIAGNOSIS — J441 Chronic obstructive pulmonary disease with (acute) exacerbation: Secondary | ICD-10-CM | POA: Diagnosis not present

## 2021-08-15 DIAGNOSIS — J9621 Acute and chronic respiratory failure with hypoxia: Secondary | ICD-10-CM | POA: Diagnosis not present

## 2021-08-15 DIAGNOSIS — J441 Chronic obstructive pulmonary disease with (acute) exacerbation: Secondary | ICD-10-CM | POA: Diagnosis not present

## 2021-08-22 DIAGNOSIS — J441 Chronic obstructive pulmonary disease with (acute) exacerbation: Secondary | ICD-10-CM | POA: Diagnosis not present

## 2021-08-22 DIAGNOSIS — J9621 Acute and chronic respiratory failure with hypoxia: Secondary | ICD-10-CM | POA: Diagnosis not present

## 2021-09-03 DIAGNOSIS — Z79891 Long term (current) use of opiate analgesic: Secondary | ICD-10-CM | POA: Diagnosis not present

## 2021-09-03 DIAGNOSIS — M961 Postlaminectomy syndrome, not elsewhere classified: Secondary | ICD-10-CM | POA: Diagnosis not present

## 2021-09-03 DIAGNOSIS — R269 Unspecified abnormalities of gait and mobility: Secondary | ICD-10-CM | POA: Diagnosis not present

## 2021-09-03 DIAGNOSIS — G894 Chronic pain syndrome: Secondary | ICD-10-CM | POA: Diagnosis not present

## 2021-09-13 DIAGNOSIS — J9621 Acute and chronic respiratory failure with hypoxia: Secondary | ICD-10-CM | POA: Diagnosis not present

## 2021-09-13 DIAGNOSIS — J441 Chronic obstructive pulmonary disease with (acute) exacerbation: Secondary | ICD-10-CM | POA: Diagnosis not present

## 2021-09-22 DIAGNOSIS — J441 Chronic obstructive pulmonary disease with (acute) exacerbation: Secondary | ICD-10-CM | POA: Diagnosis not present

## 2021-09-22 DIAGNOSIS — J9621 Acute and chronic respiratory failure with hypoxia: Secondary | ICD-10-CM | POA: Diagnosis not present

## 2021-10-14 DIAGNOSIS — J441 Chronic obstructive pulmonary disease with (acute) exacerbation: Secondary | ICD-10-CM | POA: Diagnosis not present

## 2021-10-14 DIAGNOSIS — J9621 Acute and chronic respiratory failure with hypoxia: Secondary | ICD-10-CM | POA: Diagnosis not present

## 2021-10-23 DIAGNOSIS — J441 Chronic obstructive pulmonary disease with (acute) exacerbation: Secondary | ICD-10-CM | POA: Diagnosis not present

## 2021-10-23 DIAGNOSIS — J9621 Acute and chronic respiratory failure with hypoxia: Secondary | ICD-10-CM | POA: Diagnosis not present

## 2021-11-04 DIAGNOSIS — G894 Chronic pain syndrome: Secondary | ICD-10-CM | POA: Diagnosis not present

## 2021-11-04 DIAGNOSIS — Z79891 Long term (current) use of opiate analgesic: Secondary | ICD-10-CM | POA: Diagnosis not present

## 2021-11-04 DIAGNOSIS — R269 Unspecified abnormalities of gait and mobility: Secondary | ICD-10-CM | POA: Diagnosis not present

## 2021-11-04 DIAGNOSIS — M961 Postlaminectomy syndrome, not elsewhere classified: Secondary | ICD-10-CM | POA: Diagnosis not present

## 2021-11-13 DIAGNOSIS — J9621 Acute and chronic respiratory failure with hypoxia: Secondary | ICD-10-CM | POA: Diagnosis not present

## 2021-11-13 DIAGNOSIS — J441 Chronic obstructive pulmonary disease with (acute) exacerbation: Secondary | ICD-10-CM | POA: Diagnosis not present

## 2021-11-22 DIAGNOSIS — J441 Chronic obstructive pulmonary disease with (acute) exacerbation: Secondary | ICD-10-CM | POA: Diagnosis not present

## 2021-11-22 DIAGNOSIS — J9621 Acute and chronic respiratory failure with hypoxia: Secondary | ICD-10-CM | POA: Diagnosis not present

## 2021-12-14 DIAGNOSIS — J9621 Acute and chronic respiratory failure with hypoxia: Secondary | ICD-10-CM | POA: Diagnosis not present

## 2021-12-14 DIAGNOSIS — J441 Chronic obstructive pulmonary disease with (acute) exacerbation: Secondary | ICD-10-CM | POA: Diagnosis not present

## 2021-12-23 DIAGNOSIS — J9621 Acute and chronic respiratory failure with hypoxia: Secondary | ICD-10-CM | POA: Diagnosis not present

## 2021-12-23 DIAGNOSIS — J441 Chronic obstructive pulmonary disease with (acute) exacerbation: Secondary | ICD-10-CM | POA: Diagnosis not present

## 2021-12-30 DIAGNOSIS — R269 Unspecified abnormalities of gait and mobility: Secondary | ICD-10-CM | POA: Diagnosis not present

## 2021-12-30 DIAGNOSIS — Z79891 Long term (current) use of opiate analgesic: Secondary | ICD-10-CM | POA: Diagnosis not present

## 2021-12-30 DIAGNOSIS — M961 Postlaminectomy syndrome, not elsewhere classified: Secondary | ICD-10-CM | POA: Diagnosis not present

## 2021-12-30 DIAGNOSIS — G894 Chronic pain syndrome: Secondary | ICD-10-CM | POA: Diagnosis not present

## 2022-01-02 DIAGNOSIS — Z6824 Body mass index (BMI) 24.0-24.9, adult: Secondary | ICD-10-CM | POA: Diagnosis not present

## 2022-01-02 DIAGNOSIS — Z1331 Encounter for screening for depression: Secondary | ICD-10-CM | POA: Diagnosis not present

## 2022-01-02 DIAGNOSIS — I10 Essential (primary) hypertension: Secondary | ICD-10-CM | POA: Diagnosis not present

## 2022-01-02 DIAGNOSIS — E7800 Pure hypercholesterolemia, unspecified: Secondary | ICD-10-CM | POA: Diagnosis not present

## 2022-01-02 DIAGNOSIS — R69 Illness, unspecified: Secondary | ICD-10-CM | POA: Diagnosis not present

## 2022-01-02 DIAGNOSIS — E1165 Type 2 diabetes mellitus with hyperglycemia: Secondary | ICD-10-CM | POA: Diagnosis not present

## 2022-01-02 DIAGNOSIS — Z7189 Other specified counseling: Secondary | ICD-10-CM | POA: Diagnosis not present

## 2022-01-02 DIAGNOSIS — Z1211 Encounter for screening for malignant neoplasm of colon: Secondary | ICD-10-CM | POA: Diagnosis not present

## 2022-01-02 DIAGNOSIS — Z299 Encounter for prophylactic measures, unspecified: Secondary | ICD-10-CM | POA: Diagnosis not present

## 2022-01-02 DIAGNOSIS — Z1339 Encounter for screening examination for other mental health and behavioral disorders: Secondary | ICD-10-CM | POA: Diagnosis not present

## 2022-01-02 DIAGNOSIS — Z79899 Other long term (current) drug therapy: Secondary | ICD-10-CM | POA: Diagnosis not present

## 2022-01-02 DIAGNOSIS — Z0000 Encounter for general adult medical examination without abnormal findings: Secondary | ICD-10-CM | POA: Diagnosis not present

## 2022-01-02 DIAGNOSIS — F17210 Nicotine dependence, cigarettes, uncomplicated: Secondary | ICD-10-CM | POA: Diagnosis not present

## 2022-01-02 DIAGNOSIS — R5383 Other fatigue: Secondary | ICD-10-CM | POA: Diagnosis not present

## 2022-01-13 DIAGNOSIS — J9621 Acute and chronic respiratory failure with hypoxia: Secondary | ICD-10-CM | POA: Diagnosis not present

## 2022-01-13 DIAGNOSIS — J441 Chronic obstructive pulmonary disease with (acute) exacerbation: Secondary | ICD-10-CM | POA: Diagnosis not present

## 2022-01-15 ENCOUNTER — Ambulatory Visit: Payer: Medicare HMO | Attending: Cardiology | Admitting: Cardiology

## 2022-01-15 ENCOUNTER — Encounter: Payer: Self-pay | Admitting: Cardiology

## 2022-01-15 VITALS — BP 144/75 | HR 56 | Ht 64.0 in | Wt 149.8 lb

## 2022-01-15 DIAGNOSIS — I10 Essential (primary) hypertension: Secondary | ICD-10-CM

## 2022-01-15 DIAGNOSIS — I6523 Occlusion and stenosis of bilateral carotid arteries: Secondary | ICD-10-CM | POA: Diagnosis not present

## 2022-01-15 DIAGNOSIS — I2510 Atherosclerotic heart disease of native coronary artery without angina pectoris: Secondary | ICD-10-CM

## 2022-01-15 DIAGNOSIS — E782 Mixed hyperlipidemia: Secondary | ICD-10-CM

## 2022-01-15 MED ORDER — AMLODIPINE BESYLATE 5 MG PO TABS: 5.0000 mg | ORAL_TABLET | Freq: Every day | ORAL | 1 refills | Status: DC

## 2022-01-15 NOTE — Progress Notes (Signed)
Clinical Summary [name] is a 71 y.o.female seen today for follow up of the following medical problems.    1. CAD   - prior STEMI 12/2012, found to have severe multivessel disease on cath. Had emergent CABG 4 vessel (LIMA-LAD, SVG to ramus, SVG to LCX, SVG to RCA. LVEF 40% by LV gram   - repeat echo 04/2013 showed normalized LVEF, 55-60%     Jan 2023 echo: LVEF 55-60%, grade I dd, normal RV, mild MR, normal IVC  - no chest pains, chronic SOB that is stable, remains on 3L [place] for her COPD - compliant with meds    2. Hyperlipidemia   - Prior side effects on crestor, tolerating atorvastatin well.    -labs followed by pcp   11/2020 TC 122 TG 141 HDL 42 LDL 55 12/2021 TC 116 TG 116 HDL 47 LDL 48   3. HTN - pcp increased lisinopril earlier this month.    4. COPD - PFTs 10/2014 with severe COPD -followed by pcp   10/2020 admission with COPD exacerbation to [facility] - discharge notes discuss hospice/palliative care for end stage COPD however patient did not commit at the time.  -pro BNP was 9500, CXR was clear   - started on home O2 since discharge, on 3L     5. Anemia - noted during 10/2020 admission. Received 2 units pRBCs   6. Carotid stenosis - 06/2021 carotid US: RICA 24-23%, LICA 5-36%. Left subclavian was stenosis   Husband passed from [place] in January Past Medical History:  Diagnosis Date   Anxiety    Arthritis    Chronic back pain    Complication of anesthesia    Depression    Depression    Hypertension    Myocardial infarction ([place])    x2.   PONV (postoperative nausea and vomiting)      Allergies  Allergen Reactions   Black Pepper [[place]] Hives, Itching and Rash   Other Hives, Itching, Rash and Other (See Comments)    METALS   Tape Hives, Itching and Rash     Current Outpatient Medications  Medication Sig Dispense Refill   albuterol (PROVENTIL HFA;VENTOLIN HFA) 108 (90 Base) MCG/ACT inhaler Inhale 2 puffs into the lungs every 6  (six) hours as needed for wheezing or shortness of breath.     ANORO ELLIPTA 62.5-25 MCG/INH AEPB Inhale 1 puff into the lungs daily.      aspirin EC 81 MG tablet Take 81 mg by mouth daily.     atorvastatin (LIPITOR) 80 MG tablet Take 80 mg by mouth daily.     bisoprolol (ZEBETA) 5 MG tablet Take 1 tablet by mouth once daily 30 tablet 6   DULoxetine (CYMBALTA) 30 MG capsule Take 30 mg by mouth 2 (two) times daily.     gabapentin (NEURONTIN) 600 MG tablet Take 600 mg by mouth every 8 (eight) hours.     glipiZIDE (GLUCOTROL XL) 5 MG 24 hr tablet Take 5 mg by mouth 2 (two) times daily.     HYDROcodone-acetaminophen (NORCO) 7.5-325 MG tablet Take 1 tablet by mouth every 8 (eight) hours as needed.     ipratropium-albuterol (DUONEB) 0.5-2.5 (3) MG/3ML SOLN Take 3 mLs by nebulization 4 (four) times daily.     levothyroxine (SYNTHROID) 50 MCG tablet Take 50 mcg by mouth daily.     lisinopril (ZESTRIL) 20 MG tablet Take 20 mg by mouth daily.     metFORMIN (GLUCOPHAGE) 500 MG tablet Take  500 mg by mouth daily with breakfast.     No current facility-administered medications for this visit.     Past Surgical History:  Procedure Laterality Date   ABDOMINAL HYSTERECTOMY     APPENDECTOMY     BACK SURGERY     x3; fusion with pins and rods   BREAST LUMPECTOMY Left    CHOLECYSTECTOMY     COLONOSCOPY N/A 11/18/2013   Procedure: COLONOSCOPY;  Surgeon: Rogene Houston, MD;  [facility];  Service: Endoscopy;  Laterality: N/A;  1200   CORONARY ARTERY BYPASS GRAFT N/A 01/22/2013   Procedure: Coronary Artery Bypass Grafting Times Four Using Left Internal Mammary Artery and Right Saphenous Leg Vein Harvested Endoscopically;  Surgeon: Grace Isaac, MD;  Location: [place];  Service: Open Heart Surgery;  Laterality: N/A;   ESOPHAGOGASTRODUODENOSCOPY (EGD) WITH PROPOFOL N/A 11/23/2015   Procedure: ESOPHAGOGASTRODUODENOSCOPY (EGD) WITH PROPOFOL;  Surgeon: Rogene Houston, MD;  [facility];   Service: Endoscopy;  Laterality: N/A;  12:35   EXPLORATORY LAPAROTOMY     LEFT HEART CATHETERIZATION WITH CORONARY ANGIOGRAM  01/22/2013   Procedure: LEFT HEART CATHETERIZATION WITH CORONARY ANGIOGRAM;  Surgeon: Lorretta Harp, MD;  Location: [facility] CATH LAB;  Service: Cardiovascular;;   ORIF ANKLE FRACTURE Left    SPINAL FUSION     x 2   TONSILLECTOMY     age 42   TUBAL LIGATION       Allergies  Allergen Reactions   Black Pepper [Piper] Hives, Itching and Rash   Other Hives, Itching, Rash and Other (See Comments)    METALS   Tape Hives, Itching and Rash      Family History  Problem Relation Age of Onset   Alcohol abuse Father    Schizophrenia Father    Alcohol abuse Sister    Alcohol abuse Brother    Heart disease Other      Social History [name] reports that she has been smoking cigarettes and e-cigarettes. She started smoking about 56 years ago. She has a 25.00 pack-year smoking history. She has never used smokeless tobacco. [name] reports no history of alcohol use.   Review of Systems CONSTITUTIONAL: No weight loss, fever, chills, weakness or fatigue.  HEENT: Eyes: No visual loss, blurred vision, double vision or yellow sclerae.No hearing loss, sneezing, congestion, runny nose or sore throat.  SKIN: No rash or itching.  CARDIOVASCULAR: per hpi RESPIRATORY: No shortness of breath, cough or sputum.  GASTROINTESTINAL: No anorexia, nausea, vomiting or diarrhea. No abdominal pain or blood.  GENITOURINARY: No burning on urination, no polyuria NEUROLOGICAL: No headache, dizziness, syncope, paralysis, ataxia, numbness or tingling in the extremities. No change in bowel or bladder control.  MUSCULOSKELETAL: No muscle, back pain, joint pain or stiffness.  LYMPHATICS: No enlarged nodes. No history of splenectomy.  PSYCHIATRIC: No history of depression or anxiety.  ENDOCRINOLOGIC: No reports of sweating, cold or heat intolerance. No polyuria or polydipsia.   [name]   Physical Examination Today's Vitals   01/15/22 1253 01/15/22 1320  BP: (!) 160/86 (!) 144/75  Pulse: (!) 56   SpO2: 100%   Weight: 149 lb 12.8 oz (67.9 kg)   Height: '5\' 4"'$  (1.626 m)    Body mass index is 25.71 kg/m.  Gen: resting comfortably, no acute distress HEENT: no scleral icterus, pupils equal round and reactive, no palptable cervical adenopathy,  CV: RRR, no m/r/g no jvd Resp: Clear to auscultation bilaterally GI: abdomen is soft, non-tender, non-distended, normal  bowel sounds, no hepatosplenomegaly MSK: extremities are warm, no edema.  Skin: warm, no rash Neuro:  no focal deficits Psych: appropriate affect   Diagnostic Studies  04/2013 Echo LVEF 63-84%, grade I diastolic dysfunction, mild MR   10/2014 PFTs: severe COPD   Jan 2023 echo 1. Left ventricular ejection fraction, by estimation, is 55 to 60%. The  left ventricle has normal function. The left ventricle demonstrates  regional wall motion abnormalities (see scoring diagram/findings for  description). Left ventricular diastolic  parameters are consistent with Grade I diastolic dysfunction (impaired  relaxation). Mildly reduced global longitudinal strain of -14%.   2. Right ventricular systolic function is normal. The right ventricular  size is normal. Tricuspid regurgitation signal is inadequate for assessing  PA pressure.   3. The mitral valve is grossly normal. Mild mitral valve regurgitation.   4. The aortic valve is tricuspid. Aortic valve regurgitation is not  visualized. Aortic valve sclerosis/calcification is present, without any  evidence of aortic stenosis. Aortic valve mean gradient measures 4.0 mmHg.   5. The inferior vena cava is normal in size with greater than 50%  respiratory variability, suggesting right atrial pressure of 3 mmHg.   06/2021 carotid US Summary:  Right Carotid: Velocities in the right ICA are consistent with a 40-59%                 stenosis.   Left Carotid:  Velocities in the left ICA are consistent with a 1-39%  stenosis.   Vertebrals: Bilateral vertebral arteries demonstrate antegrade flow.  Subclavians: Left subclavian artery was stenotic. Normal flow hemodynamics  were              seen in the right subclavian artery.    Assessment and Plan   1. CAD - denies any recent symptoms, continue current meds   2. Hyperlipidemia   - lipids are at goal, continue current meds   3. Carotid stenosis - mild to moderate disease, continue to monitor with repeat US next year  4.HTN - above goal, start norvasc '5mg'$  daily.    Arnoldo Lenis, M.D.

## 2022-01-15 NOTE — Patient Instructions (Signed)
Medication Instructions:  Your physician has recommended you make the following change in your medication:  Start amlodipine 5 mg daily Continue other medications the same  Labwork: none  Testing/Procedures: none  Follow-Up: Your physician recommends that you schedule a follow-up appointment in: 6 months  Any Other Special Instructions Will Be Listed Below (If Applicable).  If you need a refill on your cardiac medications before your next appointment, please call your pharmacy.

## 2022-01-15 NOTE — Addendum Note (Signed)
Addended by: Merlene Laughter on: 01/15/2022 01:25 PM   Modules accepted: Orders

## 2022-01-22 DIAGNOSIS — J9621 Acute and chronic respiratory failure with hypoxia: Secondary | ICD-10-CM | POA: Diagnosis not present

## 2022-01-22 DIAGNOSIS — J441 Chronic obstructive pulmonary disease with (acute) exacerbation: Secondary | ICD-10-CM | POA: Diagnosis not present

## 2022-01-24 DIAGNOSIS — J449 Chronic obstructive pulmonary disease, unspecified: Secondary | ICD-10-CM | POA: Diagnosis not present

## 2022-01-24 DIAGNOSIS — I10 Essential (primary) hypertension: Secondary | ICD-10-CM | POA: Diagnosis not present

## 2022-01-24 DIAGNOSIS — N1831 Chronic kidney disease, stage 3a: Secondary | ICD-10-CM | POA: Diagnosis not present

## 2022-01-24 DIAGNOSIS — Z299 Encounter for prophylactic measures, unspecified: Secondary | ICD-10-CM | POA: Diagnosis not present

## 2022-01-24 DIAGNOSIS — R69 Illness, unspecified: Secondary | ICD-10-CM | POA: Diagnosis not present

## 2022-01-24 DIAGNOSIS — F17210 Nicotine dependence, cigarettes, uncomplicated: Secondary | ICD-10-CM | POA: Diagnosis not present

## 2022-01-24 DIAGNOSIS — J9611 Chronic respiratory failure with hypoxia: Secondary | ICD-10-CM | POA: Diagnosis not present

## 2022-02-10 ENCOUNTER — Other Ambulatory Visit: Payer: Self-pay | Admitting: Cardiology

## 2022-02-13 DIAGNOSIS — J9621 Acute and chronic respiratory failure with hypoxia: Secondary | ICD-10-CM | POA: Diagnosis not present

## 2022-02-13 DIAGNOSIS — J441 Chronic obstructive pulmonary disease with (acute) exacerbation: Secondary | ICD-10-CM | POA: Diagnosis not present

## 2022-02-22 DIAGNOSIS — J9621 Acute and chronic respiratory failure with hypoxia: Secondary | ICD-10-CM | POA: Diagnosis not present

## 2022-02-22 DIAGNOSIS — J441 Chronic obstructive pulmonary disease with (acute) exacerbation: Secondary | ICD-10-CM | POA: Diagnosis not present

## 2022-03-16 DIAGNOSIS — J441 Chronic obstructive pulmonary disease with (acute) exacerbation: Secondary | ICD-10-CM | POA: Diagnosis not present

## 2022-03-16 DIAGNOSIS — J9621 Acute and chronic respiratory failure with hypoxia: Secondary | ICD-10-CM | POA: Diagnosis not present

## 2022-03-25 DIAGNOSIS — J9621 Acute and chronic respiratory failure with hypoxia: Secondary | ICD-10-CM | POA: Diagnosis not present

## 2022-03-25 DIAGNOSIS — J441 Chronic obstructive pulmonary disease with (acute) exacerbation: Secondary | ICD-10-CM | POA: Diagnosis not present

## 2022-04-09 DIAGNOSIS — F17210 Nicotine dependence, cigarettes, uncomplicated: Secondary | ICD-10-CM | POA: Diagnosis not present

## 2022-04-09 DIAGNOSIS — R69 Illness, unspecified: Secondary | ICD-10-CM | POA: Diagnosis not present

## 2022-04-09 DIAGNOSIS — Z299 Encounter for prophylactic measures, unspecified: Secondary | ICD-10-CM | POA: Diagnosis not present

## 2022-04-09 DIAGNOSIS — I10 Essential (primary) hypertension: Secondary | ICD-10-CM | POA: Diagnosis not present

## 2022-04-09 DIAGNOSIS — M545 Low back pain, unspecified: Secondary | ICD-10-CM | POA: Diagnosis not present

## 2022-04-14 DIAGNOSIS — J9621 Acute and chronic respiratory failure with hypoxia: Secondary | ICD-10-CM | POA: Diagnosis not present

## 2022-04-14 DIAGNOSIS — J441 Chronic obstructive pulmonary disease with (acute) exacerbation: Secondary | ICD-10-CM | POA: Diagnosis not present

## 2022-04-16 DIAGNOSIS — J9621 Acute and chronic respiratory failure with hypoxia: Secondary | ICD-10-CM | POA: Diagnosis not present

## 2022-04-16 DIAGNOSIS — J441 Chronic obstructive pulmonary disease with (acute) exacerbation: Secondary | ICD-10-CM | POA: Diagnosis not present

## 2022-04-23 DIAGNOSIS — I10 Essential (primary) hypertension: Secondary | ICD-10-CM | POA: Diagnosis not present

## 2022-04-23 DIAGNOSIS — J441 Chronic obstructive pulmonary disease with (acute) exacerbation: Secondary | ICD-10-CM | POA: Diagnosis not present

## 2022-04-23 DIAGNOSIS — E1165 Type 2 diabetes mellitus with hyperglycemia: Secondary | ICD-10-CM | POA: Diagnosis not present

## 2022-04-23 DIAGNOSIS — F17210 Nicotine dependence, cigarettes, uncomplicated: Secondary | ICD-10-CM | POA: Diagnosis not present

## 2022-04-23 DIAGNOSIS — R69 Illness, unspecified: Secondary | ICD-10-CM | POA: Diagnosis not present

## 2022-04-23 DIAGNOSIS — J9621 Acute and chronic respiratory failure with hypoxia: Secondary | ICD-10-CM | POA: Diagnosis not present

## 2022-04-23 DIAGNOSIS — Z299 Encounter for prophylactic measures, unspecified: Secondary | ICD-10-CM | POA: Diagnosis not present

## 2022-04-23 DIAGNOSIS — J449 Chronic obstructive pulmonary disease, unspecified: Secondary | ICD-10-CM | POA: Diagnosis not present

## 2022-05-09 DIAGNOSIS — J449 Chronic obstructive pulmonary disease, unspecified: Secondary | ICD-10-CM | POA: Diagnosis not present

## 2022-05-09 DIAGNOSIS — J9611 Chronic respiratory failure with hypoxia: Secondary | ICD-10-CM | POA: Diagnosis not present

## 2022-05-09 DIAGNOSIS — M545 Low back pain, unspecified: Secondary | ICD-10-CM | POA: Diagnosis not present

## 2022-05-09 DIAGNOSIS — Z79899 Other long term (current) drug therapy: Secondary | ICD-10-CM | POA: Diagnosis not present

## 2022-05-09 DIAGNOSIS — R52 Pain, unspecified: Secondary | ICD-10-CM | POA: Diagnosis not present

## 2022-05-15 DIAGNOSIS — J441 Chronic obstructive pulmonary disease with (acute) exacerbation: Secondary | ICD-10-CM | POA: Diagnosis not present

## 2022-05-15 DIAGNOSIS — J9621 Acute and chronic respiratory failure with hypoxia: Secondary | ICD-10-CM | POA: Diagnosis not present

## 2022-05-24 DIAGNOSIS — J9621 Acute and chronic respiratory failure with hypoxia: Secondary | ICD-10-CM | POA: Diagnosis not present

## 2022-05-24 DIAGNOSIS — J441 Chronic obstructive pulmonary disease with (acute) exacerbation: Secondary | ICD-10-CM | POA: Diagnosis not present

## 2022-06-10 DIAGNOSIS — N1831 Chronic kidney disease, stage 3a: Secondary | ICD-10-CM | POA: Diagnosis not present

## 2022-06-10 DIAGNOSIS — F17210 Nicotine dependence, cigarettes, uncomplicated: Secondary | ICD-10-CM | POA: Diagnosis not present

## 2022-06-10 DIAGNOSIS — Z79899 Other long term (current) drug therapy: Secondary | ICD-10-CM | POA: Diagnosis not present

## 2022-06-10 DIAGNOSIS — M545 Low back pain, unspecified: Secondary | ICD-10-CM | POA: Diagnosis not present

## 2022-06-10 DIAGNOSIS — J441 Chronic obstructive pulmonary disease with (acute) exacerbation: Secondary | ICD-10-CM | POA: Diagnosis not present

## 2022-06-10 DIAGNOSIS — J449 Chronic obstructive pulmonary disease, unspecified: Secondary | ICD-10-CM | POA: Diagnosis not present

## 2022-06-10 DIAGNOSIS — J9611 Chronic respiratory failure with hypoxia: Secondary | ICD-10-CM | POA: Diagnosis not present

## 2022-06-14 DIAGNOSIS — J441 Chronic obstructive pulmonary disease with (acute) exacerbation: Secondary | ICD-10-CM | POA: Diagnosis not present

## 2022-06-14 DIAGNOSIS — J9621 Acute and chronic respiratory failure with hypoxia: Secondary | ICD-10-CM | POA: Diagnosis not present

## 2022-06-18 ENCOUNTER — Other Ambulatory Visit: Payer: Self-pay | Admitting: Cardiology

## 2022-06-19 DIAGNOSIS — F319 Bipolar disorder, unspecified: Secondary | ICD-10-CM | POA: Diagnosis not present

## 2022-06-19 DIAGNOSIS — Z0000 Encounter for general adult medical examination without abnormal findings: Secondary | ICD-10-CM | POA: Diagnosis not present

## 2022-06-19 DIAGNOSIS — Z299 Encounter for prophylactic measures, unspecified: Secondary | ICD-10-CM | POA: Diagnosis not present

## 2022-06-19 DIAGNOSIS — Z7189 Other specified counseling: Secondary | ICD-10-CM | POA: Diagnosis not present

## 2022-06-19 DIAGNOSIS — Z1339 Encounter for screening examination for other mental health and behavioral disorders: Secondary | ICD-10-CM | POA: Diagnosis not present

## 2022-06-19 DIAGNOSIS — I10 Essential (primary) hypertension: Secondary | ICD-10-CM | POA: Diagnosis not present

## 2022-06-19 DIAGNOSIS — J449 Chronic obstructive pulmonary disease, unspecified: Secondary | ICD-10-CM | POA: Diagnosis not present

## 2022-06-19 DIAGNOSIS — F339 Major depressive disorder, recurrent, unspecified: Secondary | ICD-10-CM | POA: Diagnosis not present

## 2022-06-19 DIAGNOSIS — Z1331 Encounter for screening for depression: Secondary | ICD-10-CM | POA: Diagnosis not present

## 2022-06-23 DIAGNOSIS — J441 Chronic obstructive pulmonary disease with (acute) exacerbation: Secondary | ICD-10-CM | POA: Diagnosis not present

## 2022-06-23 DIAGNOSIS — J9621 Acute and chronic respiratory failure with hypoxia: Secondary | ICD-10-CM | POA: Diagnosis not present

## 2022-07-08 ENCOUNTER — Other Ambulatory Visit: Payer: Self-pay | Admitting: Cardiology

## 2022-07-14 DIAGNOSIS — I10 Essential (primary) hypertension: Secondary | ICD-10-CM | POA: Diagnosis not present

## 2022-07-14 DIAGNOSIS — M545 Low back pain, unspecified: Secondary | ICD-10-CM | POA: Diagnosis not present

## 2022-07-15 DIAGNOSIS — J9621 Acute and chronic respiratory failure with hypoxia: Secondary | ICD-10-CM | POA: Diagnosis not present

## 2022-07-15 DIAGNOSIS — J441 Chronic obstructive pulmonary disease with (acute) exacerbation: Secondary | ICD-10-CM | POA: Diagnosis not present

## 2022-07-16 ENCOUNTER — Encounter: Payer: Self-pay | Admitting: Cardiology

## 2022-07-24 DIAGNOSIS — J441 Chronic obstructive pulmonary disease with (acute) exacerbation: Secondary | ICD-10-CM | POA: Diagnosis not present

## 2022-07-24 DIAGNOSIS — J9621 Acute and chronic respiratory failure with hypoxia: Secondary | ICD-10-CM | POA: Diagnosis not present

## 2022-07-30 DIAGNOSIS — J9611 Chronic respiratory failure with hypoxia: Secondary | ICD-10-CM | POA: Diagnosis not present

## 2022-07-30 DIAGNOSIS — I10 Essential (primary) hypertension: Secondary | ICD-10-CM | POA: Diagnosis not present

## 2022-07-30 DIAGNOSIS — F17210 Nicotine dependence, cigarettes, uncomplicated: Secondary | ICD-10-CM | POA: Diagnosis not present

## 2022-07-30 DIAGNOSIS — I152 Hypertension secondary to endocrine disorders: Secondary | ICD-10-CM | POA: Diagnosis not present

## 2022-07-30 DIAGNOSIS — Z299 Encounter for prophylactic measures, unspecified: Secondary | ICD-10-CM | POA: Diagnosis not present

## 2022-07-30 DIAGNOSIS — E1159 Type 2 diabetes mellitus with other circulatory complications: Secondary | ICD-10-CM | POA: Diagnosis not present

## 2022-08-07 ENCOUNTER — Ambulatory Visit: Payer: Medicare HMO | Attending: Cardiology

## 2022-08-07 DIAGNOSIS — I6523 Occlusion and stenosis of bilateral carotid arteries: Secondary | ICD-10-CM | POA: Diagnosis not present

## 2022-08-14 DIAGNOSIS — Z79899 Other long term (current) drug therapy: Secondary | ICD-10-CM | POA: Diagnosis not present

## 2022-08-14 DIAGNOSIS — J441 Chronic obstructive pulmonary disease with (acute) exacerbation: Secondary | ICD-10-CM | POA: Diagnosis not present

## 2022-08-14 DIAGNOSIS — F17210 Nicotine dependence, cigarettes, uncomplicated: Secondary | ICD-10-CM | POA: Diagnosis not present

## 2022-08-14 DIAGNOSIS — N1831 Chronic kidney disease, stage 3a: Secondary | ICD-10-CM | POA: Diagnosis not present

## 2022-08-14 DIAGNOSIS — J9611 Chronic respiratory failure with hypoxia: Secondary | ICD-10-CM | POA: Diagnosis not present

## 2022-08-14 DIAGNOSIS — J9621 Acute and chronic respiratory failure with hypoxia: Secondary | ICD-10-CM | POA: Diagnosis not present

## 2022-08-14 DIAGNOSIS — M545 Low back pain, unspecified: Secondary | ICD-10-CM | POA: Diagnosis not present

## 2022-08-14 DIAGNOSIS — Z299 Encounter for prophylactic measures, unspecified: Secondary | ICD-10-CM | POA: Diagnosis not present

## 2022-08-14 DIAGNOSIS — I10 Essential (primary) hypertension: Secondary | ICD-10-CM | POA: Diagnosis not present

## 2022-08-14 DIAGNOSIS — I2510 Atherosclerotic heart disease of native coronary artery without angina pectoris: Secondary | ICD-10-CM | POA: Diagnosis not present

## 2022-08-19 ENCOUNTER — Telehealth: Payer: Self-pay | Admitting: *Deleted

## 2022-08-19 NOTE — Telephone Encounter (Signed)
-----   Message from Dina Rich sent at 08/18/2022  3:16 PM EDT ----- Carotid US shows moderate blockages on both sides, we will continue to monitor at this time  J BrancH MD

## 2022-08-19 NOTE — Telephone Encounter (Signed)
Lesle Chris, LPN 1/61/0960  4:54 PM EDT Back to Top    Notified, copy to pcp.

## 2022-08-23 DIAGNOSIS — J9621 Acute and chronic respiratory failure with hypoxia: Secondary | ICD-10-CM | POA: Diagnosis not present

## 2022-08-23 DIAGNOSIS — J441 Chronic obstructive pulmonary disease with (acute) exacerbation: Secondary | ICD-10-CM | POA: Diagnosis not present

## 2022-09-11 DIAGNOSIS — R059 Cough, unspecified: Secondary | ICD-10-CM | POA: Diagnosis not present

## 2022-09-11 DIAGNOSIS — F17210 Nicotine dependence, cigarettes, uncomplicated: Secondary | ICD-10-CM | POA: Diagnosis not present

## 2022-09-11 DIAGNOSIS — Z299 Encounter for prophylactic measures, unspecified: Secondary | ICD-10-CM | POA: Diagnosis not present

## 2022-09-11 DIAGNOSIS — M545 Low back pain, unspecified: Secondary | ICD-10-CM | POA: Diagnosis not present

## 2022-09-14 DIAGNOSIS — J9621 Acute and chronic respiratory failure with hypoxia: Secondary | ICD-10-CM | POA: Diagnosis not present

## 2022-09-14 DIAGNOSIS — J441 Chronic obstructive pulmonary disease with (acute) exacerbation: Secondary | ICD-10-CM | POA: Diagnosis not present

## 2022-09-17 DIAGNOSIS — Z79899 Other long term (current) drug therapy: Secondary | ICD-10-CM | POA: Diagnosis not present

## 2022-09-17 DIAGNOSIS — R5383 Other fatigue: Secondary | ICD-10-CM | POA: Diagnosis not present

## 2022-09-17 DIAGNOSIS — Z299 Encounter for prophylactic measures, unspecified: Secondary | ICD-10-CM | POA: Diagnosis not present

## 2022-09-17 DIAGNOSIS — F17210 Nicotine dependence, cigarettes, uncomplicated: Secondary | ICD-10-CM | POA: Diagnosis not present

## 2022-09-17 DIAGNOSIS — E7800 Pure hypercholesterolemia, unspecified: Secondary | ICD-10-CM | POA: Diagnosis not present

## 2022-09-17 DIAGNOSIS — E039 Hypothyroidism, unspecified: Secondary | ICD-10-CM | POA: Diagnosis not present

## 2022-09-17 DIAGNOSIS — Z0000 Encounter for general adult medical examination without abnormal findings: Secondary | ICD-10-CM | POA: Diagnosis not present

## 2022-09-17 DIAGNOSIS — I10 Essential (primary) hypertension: Secondary | ICD-10-CM | POA: Diagnosis not present

## 2022-09-23 DIAGNOSIS — J441 Chronic obstructive pulmonary disease with (acute) exacerbation: Secondary | ICD-10-CM | POA: Diagnosis not present

## 2022-09-23 DIAGNOSIS — J9621 Acute and chronic respiratory failure with hypoxia: Secondary | ICD-10-CM | POA: Diagnosis not present

## 2022-10-09 ENCOUNTER — Other Ambulatory Visit: Payer: Self-pay | Admitting: Cardiology

## 2022-10-14 DIAGNOSIS — M545 Low back pain, unspecified: Secondary | ICD-10-CM | POA: Diagnosis not present

## 2022-10-15 DIAGNOSIS — J9621 Acute and chronic respiratory failure with hypoxia: Secondary | ICD-10-CM | POA: Diagnosis not present

## 2022-10-15 DIAGNOSIS — J441 Chronic obstructive pulmonary disease with (acute) exacerbation: Secondary | ICD-10-CM | POA: Diagnosis not present

## 2022-10-24 DIAGNOSIS — J9621 Acute and chronic respiratory failure with hypoxia: Secondary | ICD-10-CM | POA: Diagnosis not present

## 2022-10-24 DIAGNOSIS — J441 Chronic obstructive pulmonary disease with (acute) exacerbation: Secondary | ICD-10-CM | POA: Diagnosis not present

## 2022-11-04 ENCOUNTER — Ambulatory Visit: Payer: Medicare HMO | Attending: Cardiology | Admitting: Cardiology

## 2022-11-04 ENCOUNTER — Encounter: Payer: Self-pay | Admitting: Cardiology

## 2022-11-04 VITALS — BP 120/65 | HR 55 | Ht 64.0 in | Wt 140.2 lb

## 2022-11-04 DIAGNOSIS — I6523 Occlusion and stenosis of bilateral carotid arteries: Secondary | ICD-10-CM

## 2022-11-04 DIAGNOSIS — I2510 Atherosclerotic heart disease of native coronary artery without angina pectoris: Secondary | ICD-10-CM

## 2022-11-04 DIAGNOSIS — I10 Essential (primary) hypertension: Secondary | ICD-10-CM

## 2022-11-04 DIAGNOSIS — E782 Mixed hyperlipidemia: Secondary | ICD-10-CM | POA: Diagnosis not present

## 2022-11-04 NOTE — Progress Notes (Signed)
Clinical Summary Ms. Donahoo is a 72 y.o.female seen today for follow up of the following medical problems.    1. CAD   - prior STEMI 12/2012, found to have severe multivessel disease on cath. Had emergent CABG 4 vessel (LIMA-LAD, SVG to ramus, SVG to LCX, SVG to RCA. LVEF 40% by LV gram   - repeat echo 04/2013 showed normalized LVEF, 55-60%     Jan 2023 echo: LVEF 55-60%, grade I dd, normal RV, mild MR, normal IVC   - no chest pains. Chronic SOB related to her COPD, worst in humidity and better in the cool water. On home O2 3L  - compliant with meds     2. Hyperlipidemia   - Prior side effects on crestor, tolerating atorvastatin well.    -labs followed by pcp   11/2020 TC 122 TG 141 HDL 42 LDL 55 12/2021 TC 116 TG 116 HDL 47 LDL 48 - 08/2022 TC 161 TG 096 HDL 36 LDL 51   3. HTN - last visit started norvasc 5mg  daily - compliant with meds   4. COPD - PFTs 10/2014 with severe COPD -followed by pcp   10/2020 admission with COPD exacerbation to [facility] - discharge notes discuss hospice/palliative care for end stage COPD however patient did not commit at the time.  -pro BNP was 9500, CXR was clear   - started on home O2 since discharge, on 3L      5. Anemia - noted during 10/2020 admission. Received 2 units pRBCs   6. Carotid stenosis - 06/2021 carotid US: RICA 40-59%, LICA 1-39%. Left subclavian was stenosis - 07/2022 carotid US: RICA 40-59%, LICA 40-59%     Husband passed from [place] in January Past Medical History:  Diagnosis Date   Anxiety    Arthritis    Chronic back pain    Complication of anesthesia    Depression    Depression    Hypertension    Myocardial infarction ([facility])    x2.   PONV (postoperative nausea and vomiting)      Allergies  Allergen Reactions   Black Pepper [Piper] Hives, Itching and Rash   Other Hives, Itching, Rash and Other (See Comments)    METALS   Tape Hives, Itching and Rash     Current Outpatient Medications   Medication Sig Dispense Refill   albuterol (PROVENTIL HFA;VENTOLIN HFA) 108 (90 Base) MCG/ACT inhaler Inhale 2 puffs into the lungs every 6 (six) hours as needed for wheezing or shortness of breath.     amLODipine (NORVASC) 5 MG tablet TAKE 1 TABLET BY MOUTH ONCE DAILY . APPOINTMENT REQUIRED FOR FUTURE REFILLS 90 tablet 0   ANORO ELLIPTA 62.5-25 MCG/INH AEPB Inhale 1 puff into the lungs daily.      aspirin EC 81 MG tablet Take 81 mg by mouth daily.     atorvastatin (LIPITOR) 80 MG tablet Take 80 mg by mouth daily.     bisoprolol (ZEBETA) 5 MG tablet Take 1 tablet by mouth once daily 30 tablet 9   DULoxetine (CYMBALTA) 30 MG capsule Take 30 mg by mouth 2 (two) times daily.     gabapentin (NEURONTIN) 600 MG tablet Take 600 mg by mouth every 8 (eight) hours.     glipiZIDE (GLUCOTROL XL) 5 MG 24 hr tablet Take 5 mg by mouth 2 (two) times daily.     HYDROcodone-acetaminophen (NORCO) 7.5-325 MG tablet Take 1 tablet by mouth every 8 (eight) hours as needed.  ipratropium-albuterol (DUONEB) 0.5-2.5 (3) MG/3ML SOLN Take 3 mLs by nebulization 4 (four) times daily.     levothyroxine (SYNTHROID) 50 MCG tablet Take 50 mcg by mouth daily.     lisinopril (ZESTRIL) 20 MG tablet Take 20 mg by mouth daily.     metFORMIN (GLUCOPHAGE) 500 MG tablet Take 500 mg by mouth daily with breakfast.     ondansetron (ZOFRAN) 4 MG tablet Take 4 mg by mouth 4 (four) times daily as needed.     No current facility-administered medications for this visit.     Past Surgical History:  Procedure Laterality Date   ABDOMINAL HYSTERECTOMY     APPENDECTOMY     BACK SURGERY     x3; fusion with pins and rods   BREAST LUMPECTOMY Left    CHOLECYSTECTOMY     COLONOSCOPY N/A 11/18/2013   Procedure: COLONOSCOPY;  Surgeon: Malissa Hippo, MD;  [facility];  Service: Endoscopy;  Laterality: N/A;  1200   CORONARY ARTERY BYPASS GRAFT N/A 01/22/2013   Procedure: Coronary Artery Bypass Grafting Times Four Using Left  Internal Mammary Artery and Right Saphenous Leg Vein Harvested Endoscopically;  Surgeon: Delight Ovens, MD;  Location: [facility];  Service: Open Heart Surgery;  Laterality: N/A;   ESOPHAGOGASTRODUODENOSCOPY (EGD) WITH PROPOFOL N/A 11/23/2015   Procedure: ESOPHAGOGASTRODUODENOSCOPY (EGD) WITH PROPOFOL;  Surgeon: Malissa Hippo, MD;  [facility];  Service: Endoscopy;  Laterality: N/A;  12:35   EXPLORATORY LAPAROTOMY     LEFT HEART CATHETERIZATION WITH CORONARY ANGIOGRAM  01/22/2013   Procedure: LEFT HEART CATHETERIZATION WITH CORONARY ANGIOGRAM;  Surgeon: Runell Gess, MD;  Location: [facility] CATH LAB;  Service: Cardiovascular;;   ORIF ANKLE FRACTURE Left    SPINAL FUSION     x 2   TONSILLECTOMY     age 65   TUBAL LIGATION       Allergies  Allergen Reactions   Black Pepper [Piper] Hives, Itching and Rash   Other Hives, Itching, Rash and Other (See Comments)    METALS   Tape Hives, Itching and Rash      Family History  Problem Relation Age of Onset   Alcohol abuse Father    Schizophrenia Father    Alcohol abuse Sister    Alcohol abuse Brother    Heart disease Other      Social History Ms. Smucker reports that she has been smoking cigarettes and e-cigarettes. She started smoking about 57 years ago. She has a 28.9 pack-year smoking history. She has never used smokeless tobacco. Ms. Barcomb reports no history of alcohol use.   Review of Systems CONSTITUTIONAL: No weight loss, fever, chills, weakness or fatigue.  HEENT: Eyes: No visual loss, blurred vision, double vision or yellow sclerae.No hearing loss, sneezing, congestion, runny nose or sore throat.  SKIN: No rash or itching.  CARDIOVASCULAR: per hpi RESPIRATORY: No shortness of breath, cough or sputum.  GASTROINTESTINAL: No anorexia, nausea, vomiting or diarrhea. No abdominal pain or blood.  GENITOURINARY: No burning on urination, no polyuria NEUROLOGICAL: No headache, dizziness, syncope, paralysis,  ataxia, numbness or tingling in the extremities. No change in bowel or bladder control.  MUSCULOSKELETAL: No muscle, back pain, joint pain or stiffness.  LYMPHATICS: No enlarged nodes. No history of splenectomy.  PSYCHIATRIC: No history of depression or anxiety.  ENDOCRINOLOGIC: No reports of sweating, cold or heat intolerance. No polyuria or polydipsia.  [name]   Physical Examination Today's Vitals   11/04/22 1425 11/04/22 1441  BP: [name])  146/82 120/65  Pulse: (!) 55   SpO2: 98%   Weight: 140 lb 3.2 oz (63.6 kg)   Height: 5\' 4"  (1.626 m)    Body mass index is 24.07 kg/m.  Gen: resting comfortably, no acute distress HEENT: no scleral icterus, pupils equal round and reactive, no palptable cervical adenopathy,  CV: RRR, no mrg, no jvd Resp: Clear to auscultation bilaterally GI: abdomen is soft, non-tender, non-distended, normal bowel sounds, no hepatosplenomegaly MSK: extremities are warm, no edema.  Skin: warm, no rash Neuro:  no focal deficits Psych: appropriate affect   Diagnostic Studies  04/2013 Echo LVEF 55-60%, grade I diastolic dysfunction, mild MR   10/2014 PFTs: severe COPD   Jan 2023 echo 1. Left ventricular ejection fraction, by estimation, is 55 to 60%. The  left ventricle has normal function. The left ventricle demonstrates  regional wall motion abnormalities (see scoring diagram/findings for  description). Left ventricular diastolic  parameters are consistent with Grade I diastolic dysfunction (impaired  relaxation). Mildly reduced global longitudinal strain of -14%.   2. Right ventricular systolic function is normal. The right ventricular  size is normal. Tricuspid regurgitation signal is inadequate for assessing  PA pressure.   3. The mitral valve is grossly normal. Mild mitral valve regurgitation.   4. The aortic valve is tricuspid. Aortic valve regurgitation is not  visualized. Aortic valve sclerosis/calcification is present, without any  evidence of aortic  stenosis. Aortic valve mean gradient measures 4.0 mmHg.   5. The inferior vena cava is normal in size with greater than 50%  respiratory variability, suggesting right atrial pressure of 3 mmHg.    06/2021 carotid US Summary:  Right Carotid: Velocities in the right ICA are consistent with a 40-59%                 stenosis.   Left Carotid: Velocities in the left ICA are consistent with a 1-39%  stenosis.   Vertebrals: Bilateral vertebral arteries demonstrate antegrade flow.  Subclavians: Left subclavian artery was stenotic. Normal flow hemodynamics  were              seen in the right subclavian artery.    07/2022 carotid US Summary:  Right Carotid: Velocities in the right ICA are consistent with a 40-59%                 stenosis.   Left Carotid: Velocities in the left ICA are consistent with a 40-59%  stenosis.   Vertebrals: Bilateral vertebral arteries demonstrate antegrade flow.  Subclavians: Normal flow hemodynamics were seen in bilateral subclavian               arteries.   Assessment and Plan   1. CAD - no symptoms, continue current meds   2. Hyperlipidemia   - LDL at goal, discussed dietary changes to lower TGs   3. Carotid stenosis - moderate bilateral disease by [place] this year, conitnue medical therapy - repeat US next year   4.HTN -at goal, based on manual recheck, continue current meds    Antoine Poche, M.D.

## 2022-11-04 NOTE — Patient Instructions (Signed)
Medication Instructions:  Continue all current medications.  Labwork: none  Testing/Procedures: none  Follow-Up: 6 months   Any Other Special Instructions Will Be Listed Below (If Applicable).  If you need a refill on your cardiac medications before your next appointment, please call your pharmacy.  

## 2022-11-05 ENCOUNTER — Other Ambulatory Visit: Payer: Self-pay | Admitting: Cardiology

## 2022-11-14 DIAGNOSIS — J9621 Acute and chronic respiratory failure with hypoxia: Secondary | ICD-10-CM | POA: Diagnosis not present

## 2022-11-14 DIAGNOSIS — J441 Chronic obstructive pulmonary disease with (acute) exacerbation: Secondary | ICD-10-CM | POA: Diagnosis not present

## 2022-11-20 DIAGNOSIS — E1165 Type 2 diabetes mellitus with hyperglycemia: Secondary | ICD-10-CM | POA: Diagnosis not present

## 2022-11-23 DIAGNOSIS — J9621 Acute and chronic respiratory failure with hypoxia: Secondary | ICD-10-CM | POA: Diagnosis not present

## 2022-11-23 DIAGNOSIS — J441 Chronic obstructive pulmonary disease with (acute) exacerbation: Secondary | ICD-10-CM | POA: Diagnosis not present

## 2022-12-07 ENCOUNTER — Other Ambulatory Visit: Payer: Self-pay | Admitting: Cardiology

## 2022-12-10 DIAGNOSIS — M545 Low back pain, unspecified: Secondary | ICD-10-CM | POA: Diagnosis not present

## 2022-12-10 DIAGNOSIS — R059 Cough, unspecified: Secondary | ICD-10-CM | POA: Diagnosis not present

## 2022-12-15 DIAGNOSIS — J441 Chronic obstructive pulmonary disease with (acute) exacerbation: Secondary | ICD-10-CM | POA: Diagnosis not present

## 2022-12-15 DIAGNOSIS — J9621 Acute and chronic respiratory failure with hypoxia: Secondary | ICD-10-CM | POA: Diagnosis not present

## 2022-12-24 DIAGNOSIS — J9621 Acute and chronic respiratory failure with hypoxia: Secondary | ICD-10-CM | POA: Diagnosis not present

## 2022-12-24 DIAGNOSIS — J441 Chronic obstructive pulmonary disease with (acute) exacerbation: Secondary | ICD-10-CM | POA: Diagnosis not present

## 2023-01-04 ENCOUNTER — Other Ambulatory Visit: Payer: Self-pay | Admitting: Cardiology

## 2023-01-06 DIAGNOSIS — H2513 Age-related nuclear cataract, bilateral: Secondary | ICD-10-CM | POA: Diagnosis not present

## 2023-01-06 DIAGNOSIS — E119 Type 2 diabetes mellitus without complications: Secondary | ICD-10-CM | POA: Diagnosis not present

## 2023-01-06 DIAGNOSIS — Z7984 Long term (current) use of oral hypoglycemic drugs: Secondary | ICD-10-CM | POA: Diagnosis not present

## 2023-01-14 DIAGNOSIS — J9621 Acute and chronic respiratory failure with hypoxia: Secondary | ICD-10-CM | POA: Diagnosis not present

## 2023-01-14 DIAGNOSIS — J441 Chronic obstructive pulmonary disease with (acute) exacerbation: Secondary | ICD-10-CM | POA: Diagnosis not present

## 2023-01-19 DIAGNOSIS — J449 Chronic obstructive pulmonary disease, unspecified: Secondary | ICD-10-CM | POA: Diagnosis not present

## 2023-01-19 DIAGNOSIS — J441 Chronic obstructive pulmonary disease with (acute) exacerbation: Secondary | ICD-10-CM | POA: Diagnosis not present

## 2023-01-19 DIAGNOSIS — J9621 Acute and chronic respiratory failure with hypoxia: Secondary | ICD-10-CM | POA: Diagnosis not present

## 2023-01-23 DIAGNOSIS — J441 Chronic obstructive pulmonary disease with (acute) exacerbation: Secondary | ICD-10-CM | POA: Diagnosis not present

## 2023-01-23 DIAGNOSIS — J9621 Acute and chronic respiratory failure with hypoxia: Secondary | ICD-10-CM | POA: Diagnosis not present

## 2023-02-14 DIAGNOSIS — J9621 Acute and chronic respiratory failure with hypoxia: Secondary | ICD-10-CM | POA: Diagnosis not present

## 2023-02-14 DIAGNOSIS — J441 Chronic obstructive pulmonary disease with (acute) exacerbation: Secondary | ICD-10-CM | POA: Diagnosis not present

## 2023-02-23 DIAGNOSIS — J9621 Acute and chronic respiratory failure with hypoxia: Secondary | ICD-10-CM | POA: Diagnosis not present

## 2023-02-23 DIAGNOSIS — J441 Chronic obstructive pulmonary disease with (acute) exacerbation: Secondary | ICD-10-CM | POA: Diagnosis not present

## 2023-03-17 DIAGNOSIS — J9621 Acute and chronic respiratory failure with hypoxia: Secondary | ICD-10-CM | POA: Diagnosis not present

## 2023-03-17 DIAGNOSIS — J441 Chronic obstructive pulmonary disease with (acute) exacerbation: Secondary | ICD-10-CM | POA: Diagnosis not present

## 2023-03-26 DIAGNOSIS — J441 Chronic obstructive pulmonary disease with (acute) exacerbation: Secondary | ICD-10-CM | POA: Diagnosis not present

## 2023-03-26 DIAGNOSIS — J9621 Acute and chronic respiratory failure with hypoxia: Secondary | ICD-10-CM | POA: Diagnosis not present

## 2023-04-14 DIAGNOSIS — J9621 Acute and chronic respiratory failure with hypoxia: Secondary | ICD-10-CM | POA: Diagnosis not present

## 2023-04-14 DIAGNOSIS — J441 Chronic obstructive pulmonary disease with (acute) exacerbation: Secondary | ICD-10-CM | POA: Diagnosis not present

## 2023-04-23 DIAGNOSIS — J9621 Acute and chronic respiratory failure with hypoxia: Secondary | ICD-10-CM | POA: Diagnosis not present

## 2023-04-23 DIAGNOSIS — J441 Chronic obstructive pulmonary disease with (acute) exacerbation: Secondary | ICD-10-CM | POA: Diagnosis not present

## 2023-05-11 ENCOUNTER — Encounter: Payer: Self-pay | Admitting: Cardiology

## 2023-05-11 ENCOUNTER — Ambulatory Visit: Payer: Medicare HMO | Attending: Cardiology | Admitting: Cardiology

## 2023-05-11 VITALS — BP 118/59 | HR 60 | Ht 64.0 in | Wt 134.8 lb

## 2023-05-11 DIAGNOSIS — E782 Mixed hyperlipidemia: Secondary | ICD-10-CM

## 2023-05-11 DIAGNOSIS — I6523 Occlusion and stenosis of bilateral carotid arteries: Secondary | ICD-10-CM

## 2023-05-11 DIAGNOSIS — I2510 Atherosclerotic heart disease of native coronary artery without angina pectoris: Secondary | ICD-10-CM | POA: Diagnosis not present

## 2023-05-11 DIAGNOSIS — I10 Essential (primary) hypertension: Secondary | ICD-10-CM | POA: Diagnosis not present

## 2023-05-11 NOTE — Patient Instructions (Signed)
 Medication Instructions:  Continue all current medications.   Labwork: none  Testing/Procedures: none  Follow-Up: 6 months   Any Other Special Instructions Will Be Listed Below (If Applicable).   If you need a refill on your cardiac medications before your next appointment, please call your pharmacy.

## 2023-05-11 NOTE — Progress Notes (Signed)
 Clinical Summary [name] is a 73 y.o.female seen today for follow up of the following medical problems.    1. CAD   - prior STEMI 12/2012, found to have severe multivessel disease on cath. Had emergent CABG 4 vessel (LIMA-LAD, SVG to ramus, SVG to LCX, SVG to RCA. LVEF 40% by LV gram   - repeat echo 04/2013 showed normalized LVEF, 55-60%  Jan 2023 echo: LVEF 55-60%, grade I dd, normal RV, mild MR, normal IVC   - no chest pains. Chronic SOB related to her COPD, worst in humidity and better in the cool water. On home O2 3L  - compliant with meds  - no chest pains. Chronic SOB/DOE, gradually worsening. On 3L 24 hrs a day. Compliant with inhalers - compliant with meds     2. Hyperlipidemia   - Prior side effects on crestor, tolerating atorvastatin well.    -labs followed by pcp   11/2020 TC 122 TG 141 HDL 42 LDL 55 12/2021 TC 116 TG 116 HDL 47 LDL 48 - 08/2022 TC 409 TG 811 HDL 36 LDL 51   3. HTN - last visit started norvasc 5mg  daily - compliant with meds   4. COPD - PFTs 10/2014 with severe COPD -followed by pcp   10/2020 admission with COPD exacerbation to [facility] - discharge notes discuss hospice/palliative care for end stage COPD however patient did not commit at the time.  - started on home O2 since discharge, on 3L      5. Anemia - noted during 10/2020 admission. Received 2 units pRBCs   6. Carotid stenosis - 06/2021 carotid US : RICA 40-59%, LICA 1-39%. Left subclavian was stenosis - 07/2022 carotid US : RICA 40-59%, LICA 40-59% - she is not sure she would like to repeat carotid US  at this time.      Husband passed from COVID in January Past Medical History:  Diagnosis Date   Anxiety    Arthritis    Chronic back pain    Complication of anesthesia    Depression    Depression    Hypertension    Myocardial infarction (HCC)    x2.   PONV (postoperative nausea and vomiting)      Allergies  Allergen Reactions   Black Pepper [Piper] Hives,  Itching and Rash   Other Hives, Itching, Rash and Other (See Comments)    METALS   Tape Hives, Itching and Rash     Current Outpatient Medications  Medication Sig Dispense Refill   albuterol (PROVENTIL HFA;VENTOLIN HFA) 108 (90 Base) MCG/ACT inhaler Inhale 2 puffs into the lungs every 6 (six) hours as needed for wheezing or shortness of breath.     amLODipine (NORVASC) 5 MG tablet Take 1 tablet (5 mg total) by mouth daily. 90 tablet 1   ANORO ELLIPTA 62.5-25 MCG/INH AEPB Inhale 1 puff into the lungs daily.      aspirin EC 81 MG tablet Take 81 mg by mouth daily.     atorvastatin (LIPITOR) 80 MG tablet Take 80 mg by mouth daily.     bisoprolol (ZEBETA) 5 MG tablet Take 1 tablet by mouth once daily 30 tablet 5   DULoxetine (CYMBALTA) 30 MG capsule Take 30 mg by mouth 2 (two) times daily.     gabapentin (NEURONTIN) 600 MG tablet Take 600 mg by mouth every 8 (eight) hours.     glipiZIDE (GLUCOTROL XL) 5 MG 24 hr tablet Take 5 mg by mouth 2 (two) times daily.  HYDROcodone-acetaminophen (NORCO) 7.5-325 MG tablet Take 1 tablet by mouth every 8 (eight) hours as needed.     ipratropium-albuterol (DUONEB) 0.5-2.5 (3) MG/3ML SOLN Take 3 mLs by nebulization 4 (four) times daily.     levothyroxine (SYNTHROID) 50 MCG tablet Take 50 mcg by mouth daily.     lisinopril (ZESTRIL) 20 MG tablet Take 20 mg by mouth daily.     metFORMIN (GLUCOPHAGE) 500 MG tablet Take 500 mg by mouth daily with breakfast.     ondansetron (ZOFRAN) 4 MG tablet Take 4 mg by mouth 4 (four) times daily as needed.     No current facility-administered medications for this visit.     Past Surgical History:  Procedure Laterality Date   ABDOMINAL HYSTERECTOMY     APPENDECTOMY     BACK SURGERY     x3; fusion with pins and rods   BREAST LUMPECTOMY Left    CHOLECYSTECTOMY     COLONOSCOPY N/A 11/18/2013   Procedure: COLONOSCOPY;  Surgeon: Ruby Corporal, MD;  Location: [facility];  Service: Endoscopy;  Laterality: N/A;  1200    CORONARY ARTERY BYPASS GRAFT N/A 01/22/2013   Procedure: Coronary Artery Bypass Grafting Times Four Using Left Internal Mammary Artery and Right Saphenous Leg Vein Harvested Endoscopically;  Surgeon: Norita Beauvais, MD;  Location: [facility] OR;  Service: Open Heart Surgery;  Laterality: N/A;   ESOPHAGOGASTRODUODENOSCOPY (EGD) WITH PROPOFOL N/A 11/23/2015   Procedure: ESOPHAGOGASTRODUODENOSCOPY (EGD) WITH PROPOFOL;  Surgeon: Ruby Corporal, MD;  Location: [facility];  Service: Endoscopy;  Laterality: N/A;  12:35   EXPLORATORY LAPAROTOMY     LEFT HEART CATHETERIZATION WITH CORONARY ANGIOGRAM  01/22/2013   Procedure: LEFT HEART CATHETERIZATION WITH CORONARY ANGIOGRAM;  Surgeon: Avanell Leigh, MD;  Location: [facility] CATH LAB;  Service: Cardiovascular;;   ORIF ANKLE FRACTURE Left    SPINAL FUSION     x 2   TONSILLECTOMY     age 73   TUBAL LIGATION       Allergies  Allergen Reactions   Black Pepper [Piper] Hives, Itching and Rash   Other Hives, Itching, Rash and Other (See Comments)    METALS   Tape Hives, Itching and Rash      Family History  Problem Relation Age of Onset   Alcohol abuse Father    Schizophrenia Father    Alcohol abuse Sister    Alcohol abuse Brother    Heart disease Other      Social History Ms. Badeaux reports that she has been smoking cigarettes and e-cigarettes. She started smoking about 58 years ago. She has a 29.1 pack-year smoking history. She has never used smokeless tobacco. Ms. Monds reports no history of alcohol use.     Physical Examination Today's Vitals   05/11/23 1328  BP: (!) 118/59  Pulse: 60  SpO2: 99%  Weight: 134 lb 12.8 oz (61.1 kg)  Height: 5\' 4"  (1.626 m)   Body mass index is 23.14 kg/m.  Gen: resting comfortably, no acute distress HEENT: no scleral icterus, pupils equal round and reactive, no palptable cervical adenopathy,  CV: RRR, no mrg, no jvd Resp: Clear to auscultation bilaterally GI: abdomen is soft,  non-tender, non-distended, normal bowel sounds, no hepatosplenomegaly MSK: extremities are warm, no edema.  Skin: warm, no rash Neuro:  no focal deficits Psych: appropriate affect   Diagnostic Studies  04/2013 Echo LVEF 55-60%, grade I diastolic dysfunction, mild MR   10/2014 PFTs: severe COPD   Jan 2023 echo 1.  Left ventricular ejection fraction, by estimation, is 55 to 60%. The  left ventricle has normal function. The left ventricle demonstrates  regional wall motion abnormalities (see scoring diagram/findings for  description). Left ventricular diastolic  parameters are consistent with Grade I diastolic dysfunction (impaired  relaxation). Mildly reduced global longitudinal strain of -14%.   2. Right ventricular systolic function is normal. The right ventricular  size is normal. Tricuspid regurgitation signal is inadequate for assessing  PA pressure.   3. The mitral valve is grossly normal. Mild mitral valve regurgitation.   4. The aortic valve is tricuspid. Aortic valve regurgitation is not  visualized. Aortic valve sclerosis/calcification is present, without any  evidence of aortic stenosis. Aortic valve mean gradient measures 4.0 mmHg.   5. The inferior vena cava is normal in size with greater than 50%  respiratory variability, suggesting right atrial pressure of 3 mmHg.    06/2021 carotid US  Summary:  Right Carotid: Velocities in the right ICA are consistent with a 40-59%                 stenosis.   Left Carotid: Velocities in the left ICA are consistent with a 1-39%  stenosis.   Vertebrals: Bilateral vertebral arteries demonstrate antegrade flow.  Subclavians: Left subclavian artery was stenotic. Normal flow hemodynamics  were              seen in the right subclavian artery.      07/2022 carotid US  Summary:  Right Carotid: Velocities in the right ICA are consistent with a 40-59%                 stenosis.   Left Carotid: Velocities in the left ICA are consistent  with a 40-59%  stenosis.   Vertebrals: Bilateral vertebral arteries demonstrate antegrade flow.  Subclavians: Normal flow hemodynamics were seen in bilateral subclavian               arteries.    Assessment and Plan   1. CAD - no recent symptoms, continue current meds - EKG shows SR, no ischemic changes   2. Hyperlipidemia   - at goal, continue current meds   3. Carotid stenosis - moderate bilateral disease - she is unsure if she wants to continue surveillance US , she will contact us  if she decides to repeat this year   4.HTN -at goal, continue current meds    Laurann Pollock, M.D.

## 2023-05-15 DIAGNOSIS — J9621 Acute and chronic respiratory failure with hypoxia: Secondary | ICD-10-CM | POA: Diagnosis not present

## 2023-05-15 DIAGNOSIS — J441 Chronic obstructive pulmonary disease with (acute) exacerbation: Secondary | ICD-10-CM | POA: Diagnosis not present

## 2023-05-24 DIAGNOSIS — J9621 Acute and chronic respiratory failure with hypoxia: Secondary | ICD-10-CM | POA: Diagnosis not present

## 2023-05-24 DIAGNOSIS — J441 Chronic obstructive pulmonary disease with (acute) exacerbation: Secondary | ICD-10-CM | POA: Diagnosis not present

## 2023-06-05 DIAGNOSIS — R093 Abnormal sputum: Secondary | ICD-10-CM | POA: Diagnosis not present

## 2023-06-05 DIAGNOSIS — M545 Low back pain, unspecified: Secondary | ICD-10-CM | POA: Diagnosis not present

## 2023-06-05 DIAGNOSIS — R059 Cough, unspecified: Secondary | ICD-10-CM | POA: Diagnosis not present

## 2023-06-14 DIAGNOSIS — J441 Chronic obstructive pulmonary disease with (acute) exacerbation: Secondary | ICD-10-CM | POA: Diagnosis not present

## 2023-06-14 DIAGNOSIS — J9621 Acute and chronic respiratory failure with hypoxia: Secondary | ICD-10-CM | POA: Diagnosis not present

## 2023-06-23 DIAGNOSIS — J9621 Acute and chronic respiratory failure with hypoxia: Secondary | ICD-10-CM | POA: Diagnosis not present

## 2023-06-23 DIAGNOSIS — J441 Chronic obstructive pulmonary disease with (acute) exacerbation: Secondary | ICD-10-CM | POA: Diagnosis not present

## 2023-06-25 ENCOUNTER — Other Ambulatory Visit: Payer: Self-pay | Admitting: Cardiology

## 2023-06-29 ENCOUNTER — Other Ambulatory Visit: Payer: Self-pay | Admitting: Cardiology

## 2023-07-10 DIAGNOSIS — R531 Weakness: Secondary | ICD-10-CM | POA: Diagnosis not present

## 2023-07-10 DIAGNOSIS — I10 Essential (primary) hypertension: Secondary | ICD-10-CM | POA: Diagnosis not present

## 2023-07-10 DIAGNOSIS — M545 Low back pain, unspecified: Secondary | ICD-10-CM | POA: Diagnosis not present

## 2023-07-15 DIAGNOSIS — J441 Chronic obstructive pulmonary disease with (acute) exacerbation: Secondary | ICD-10-CM | POA: Diagnosis not present

## 2023-07-15 DIAGNOSIS — J9621 Acute and chronic respiratory failure with hypoxia: Secondary | ICD-10-CM | POA: Diagnosis not present

## 2023-07-24 DIAGNOSIS — J9621 Acute and chronic respiratory failure with hypoxia: Secondary | ICD-10-CM | POA: Diagnosis not present

## 2023-07-24 DIAGNOSIS — J441 Chronic obstructive pulmonary disease with (acute) exacerbation: Secondary | ICD-10-CM | POA: Diagnosis not present

## 2023-08-14 DIAGNOSIS — J9621 Acute and chronic respiratory failure with hypoxia: Secondary | ICD-10-CM | POA: Diagnosis not present

## 2023-08-14 DIAGNOSIS — J441 Chronic obstructive pulmonary disease with (acute) exacerbation: Secondary | ICD-10-CM | POA: Diagnosis not present

## 2023-08-23 DIAGNOSIS — J9621 Acute and chronic respiratory failure with hypoxia: Secondary | ICD-10-CM | POA: Diagnosis not present

## 2023-08-23 DIAGNOSIS — J441 Chronic obstructive pulmonary disease with (acute) exacerbation: Secondary | ICD-10-CM | POA: Diagnosis not present

## 2023-08-24 DIAGNOSIS — Z1211 Encounter for screening for malignant neoplasm of colon: Secondary | ICD-10-CM | POA: Diagnosis not present

## 2023-08-24 DIAGNOSIS — Z1212 Encounter for screening for malignant neoplasm of rectum: Secondary | ICD-10-CM | POA: Diagnosis not present

## 2023-09-14 DIAGNOSIS — J441 Chronic obstructive pulmonary disease with (acute) exacerbation: Secondary | ICD-10-CM | POA: Diagnosis not present

## 2023-09-14 DIAGNOSIS — J9621 Acute and chronic respiratory failure with hypoxia: Secondary | ICD-10-CM | POA: Diagnosis not present

## 2023-09-23 DIAGNOSIS — J441 Chronic obstructive pulmonary disease with (acute) exacerbation: Secondary | ICD-10-CM | POA: Diagnosis not present

## 2023-09-23 DIAGNOSIS — J9621 Acute and chronic respiratory failure with hypoxia: Secondary | ICD-10-CM | POA: Diagnosis not present

## 2023-10-09 DIAGNOSIS — Z0000 Encounter for general adult medical examination without abnormal findings: Secondary | ICD-10-CM | POA: Diagnosis not present

## 2023-10-09 DIAGNOSIS — E039 Hypothyroidism, unspecified: Secondary | ICD-10-CM | POA: Diagnosis not present

## 2023-10-09 DIAGNOSIS — Z79899 Other long term (current) drug therapy: Secondary | ICD-10-CM | POA: Diagnosis not present

## 2023-10-09 DIAGNOSIS — E7800 Pure hypercholesterolemia, unspecified: Secondary | ICD-10-CM | POA: Diagnosis not present

## 2023-10-15 DIAGNOSIS — J441 Chronic obstructive pulmonary disease with (acute) exacerbation: Secondary | ICD-10-CM | POA: Diagnosis not present

## 2023-10-15 DIAGNOSIS — J9621 Acute and chronic respiratory failure with hypoxia: Secondary | ICD-10-CM | POA: Diagnosis not present

## 2023-10-23 IMAGING — MG MM DIGITAL SCREENING BILAT W/ TOMO AND CAD
8 series · 8 of 24 positions shown · non-contrast
Comparison: Previous exam(s).

ACR Breast Density Category a: The breast tissue is almost entirely
fatty.

CLINICAL DATA: Screening.

EXAM:
DIGITAL SCREENING BILATERAL MAMMOGRAM WITH TOMOSYNTHESIS AND CAD
TECHNIQUE: Bilateral screening digital craniocaudal and mediolateral oblique
mammograms were obtained. Bilateral screening digital breast
tomosynthesis was performed. The images were evaluated with
computer-aided detection.

[R CC synth-2D]
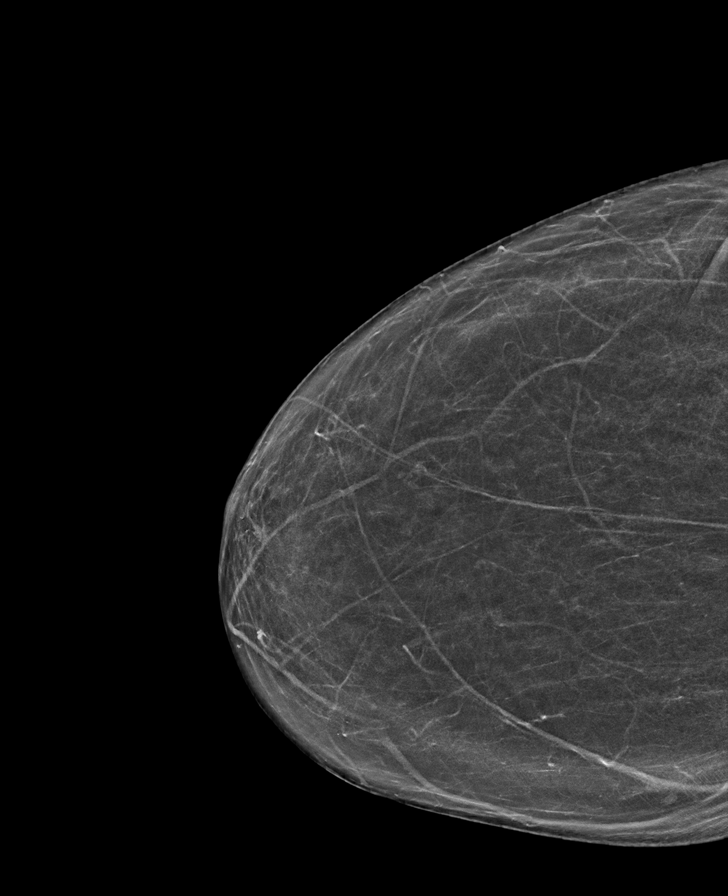

[L MLO synth-2D]
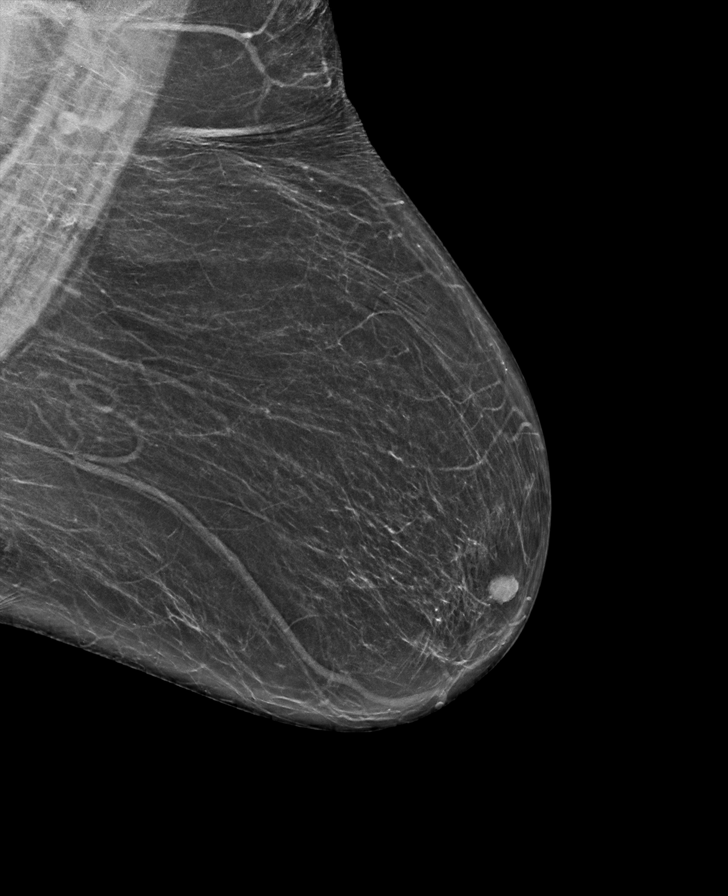

[L CC synth-2D]
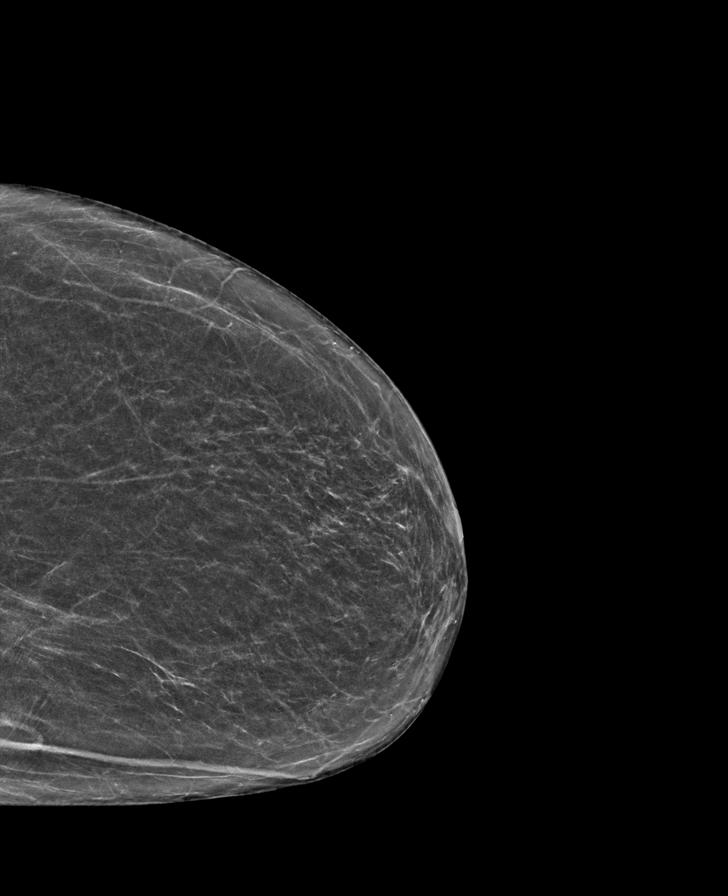

[R MLO synth-2D]
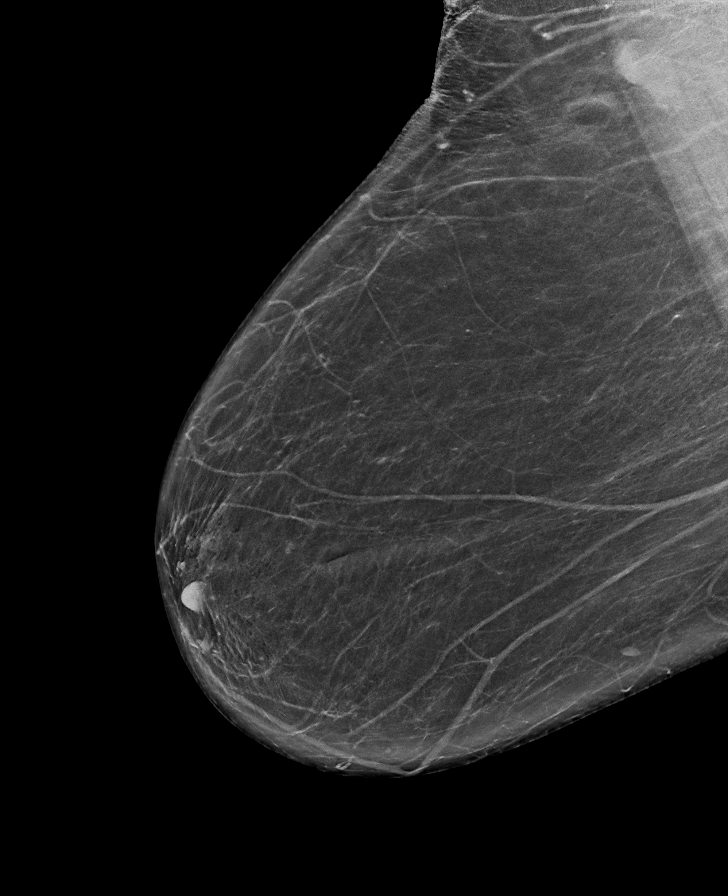

[R MLO tomo · tomo slice 37/73.0]
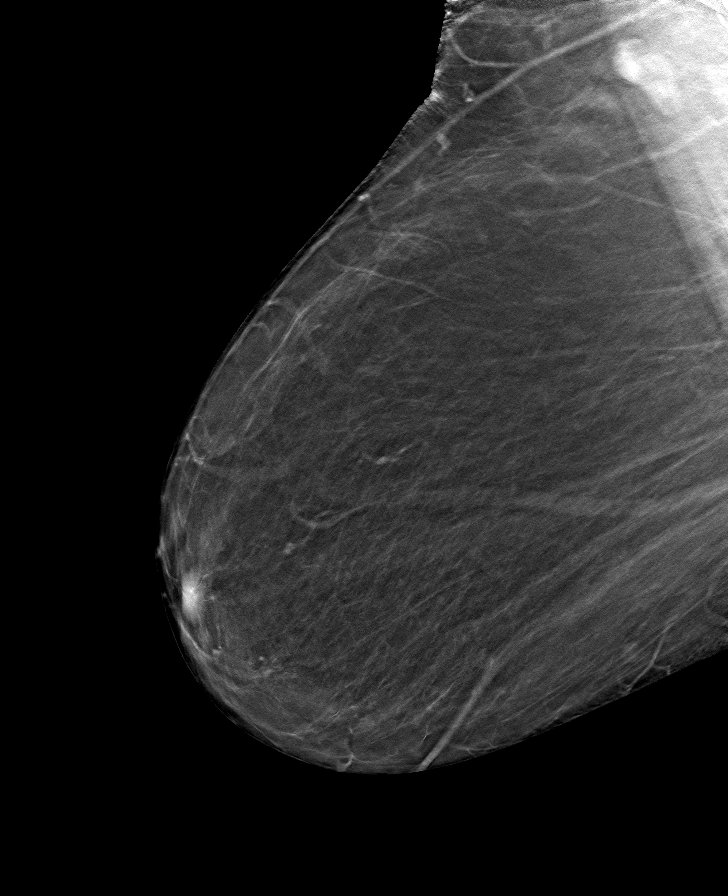

[L CC tomo · tomo slice 31/62.0]
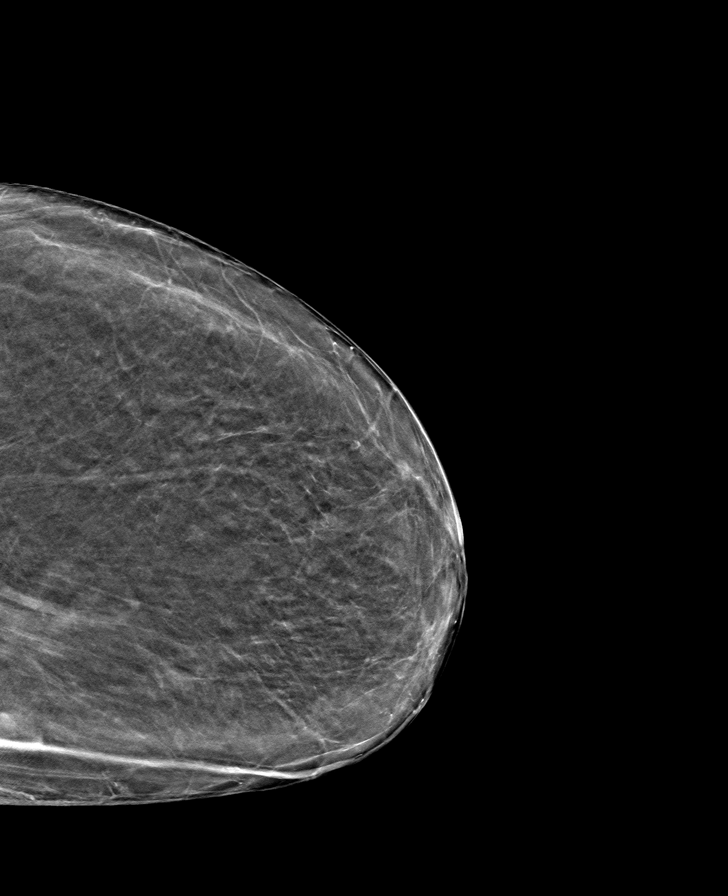

[R CC tomo · tomo slice 33/66.0]
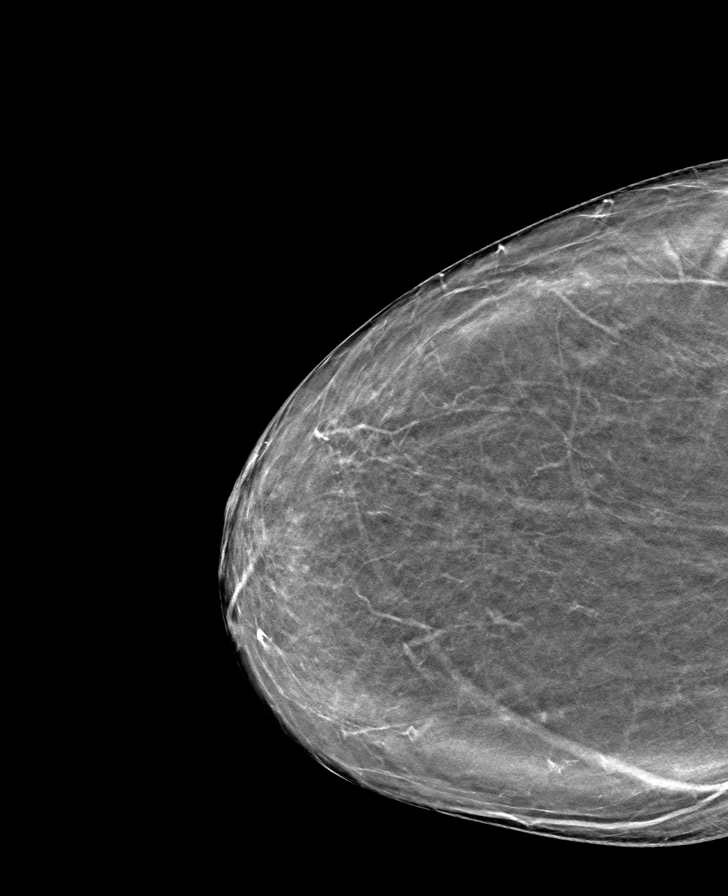

[L MLO tomo · tomo slice 35/69.0]
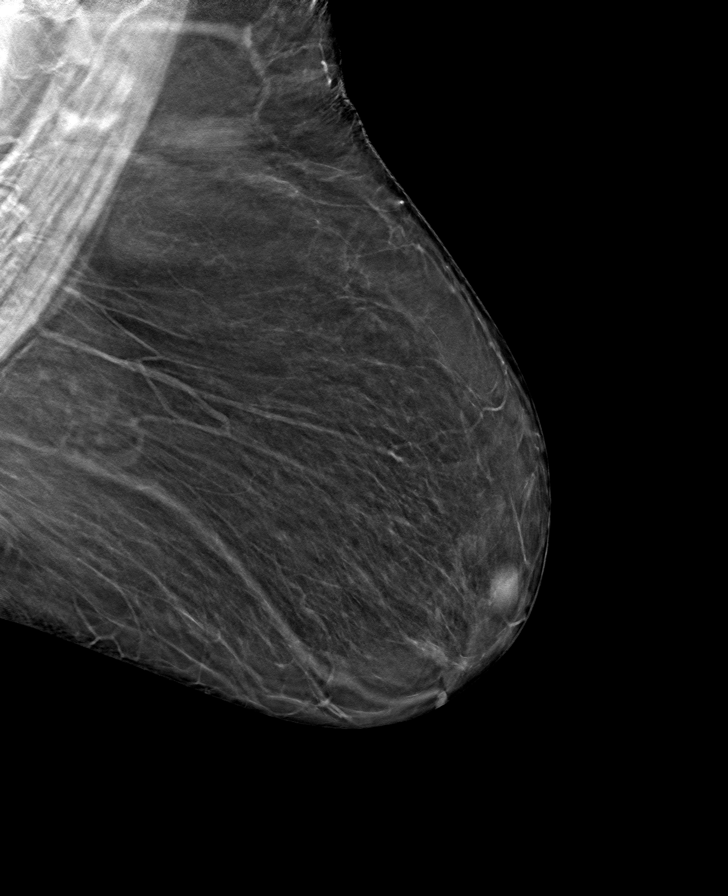

[8 of 24 positions shown; findings below may reference images not displayed]

FINDINGS: There are no findings suspicious for malignancy.
IMPRESSION: No mammographic evidence of malignancy. A result letter of this
screening mammogram will be mailed directly to the patient.

RECOMMENDATION:
Screening mammogram in one year. (Code:0E-3-N98)

BI-RADS CATEGORY  1: Negative.

## 2023-10-24 DIAGNOSIS — J9621 Acute and chronic respiratory failure with hypoxia: Secondary | ICD-10-CM | POA: Diagnosis not present

## 2023-10-24 DIAGNOSIS — J441 Chronic obstructive pulmonary disease with (acute) exacerbation: Secondary | ICD-10-CM | POA: Diagnosis not present

## 2023-11-14 ENCOUNTER — Inpatient Hospital Stay (HOSPITAL_COMMUNITY)
Admission: EM | Admit: 2023-11-14 | Discharge: 2023-11-26 | DRG: 493 | Disposition: A | Discharge status: Home / Self Care | Attending: Internal Medicine | Admitting: Internal Medicine

## 2023-11-14 ENCOUNTER — Inpatient Hospital Stay (HOSPITAL_COMMUNITY)

## 2023-11-14 ENCOUNTER — Inpatient Hospital Stay (HOSPITAL_COMMUNITY): Admitting: Anesthesiology

## 2023-11-14 ENCOUNTER — Other Ambulatory Visit: Payer: Self-pay

## 2023-11-14 ENCOUNTER — Encounter (HOSPITAL_COMMUNITY)
Admission: EM | Disposition: A | Discharge status: Home Health | Payer: Self-pay | Source: Home / Self Care | Attending: Internal Medicine

## 2023-11-14 ENCOUNTER — Emergency Department (HOSPITAL_COMMUNITY)

## 2023-11-14 ENCOUNTER — Encounter (HOSPITAL_COMMUNITY): Payer: Self-pay | Admitting: Emergency Medicine

## 2023-11-14 DIAGNOSIS — I700 Atherosclerosis of aorta: Secondary | ICD-10-CM | POA: Diagnosis not present

## 2023-11-14 DIAGNOSIS — W06XXXA Fall from bed, initial encounter: Secondary | ICD-10-CM | POA: Diagnosis present

## 2023-11-14 DIAGNOSIS — S82891A Other fracture of right lower leg, initial encounter for closed fracture: Secondary | ICD-10-CM | POA: Diagnosis present

## 2023-11-14 DIAGNOSIS — M549 Dorsalgia, unspecified: Secondary | ICD-10-CM | POA: Diagnosis present

## 2023-11-14 DIAGNOSIS — I5032 Chronic diastolic (congestive) heart failure: Secondary | ICD-10-CM | POA: Diagnosis not present

## 2023-11-14 DIAGNOSIS — M25571 Pain in right ankle and joints of right foot: Secondary | ICD-10-CM | POA: Diagnosis not present

## 2023-11-14 DIAGNOSIS — M810 Age-related osteoporosis without current pathological fracture: Secondary | ICD-10-CM | POA: Diagnosis present

## 2023-11-14 DIAGNOSIS — S82851A Displaced trimalleolar fracture of right lower leg, initial encounter for closed fracture: Secondary | ICD-10-CM | POA: Diagnosis not present

## 2023-11-14 DIAGNOSIS — Z7401 Bed confinement status: Secondary | ICD-10-CM | POA: Diagnosis not present

## 2023-11-14 DIAGNOSIS — Z9049 Acquired absence of other specified parts of digestive tract: Secondary | ICD-10-CM

## 2023-11-14 DIAGNOSIS — E559 Vitamin D deficiency, unspecified: Secondary | ICD-10-CM | POA: Diagnosis present

## 2023-11-14 DIAGNOSIS — M25572 Pain in left ankle and joints of left foot: Secondary | ICD-10-CM | POA: Diagnosis present

## 2023-11-14 DIAGNOSIS — Z885 Allergy status to narcotic agent status: Secondary | ICD-10-CM

## 2023-11-14 DIAGNOSIS — R531 Weakness: Secondary | ICD-10-CM | POA: Diagnosis not present

## 2023-11-14 DIAGNOSIS — I10 Essential (primary) hypertension: Secondary | ICD-10-CM | POA: Diagnosis not present

## 2023-11-14 DIAGNOSIS — M1990 Unspecified osteoarthritis, unspecified site: Secondary | ICD-10-CM | POA: Diagnosis present

## 2023-11-14 DIAGNOSIS — S82891D Other fracture of right lower leg, subsequent encounter for closed fracture with routine healing: Secondary | ICD-10-CM | POA: Diagnosis not present

## 2023-11-14 DIAGNOSIS — I252 Old myocardial infarction: Secondary | ICD-10-CM

## 2023-11-14 DIAGNOSIS — Z23 Encounter for immunization: Secondary | ICD-10-CM | POA: Diagnosis not present

## 2023-11-14 DIAGNOSIS — D72829 Elevated white blood cell count, unspecified: Secondary | ICD-10-CM | POA: Diagnosis present

## 2023-11-14 DIAGNOSIS — G8929 Other chronic pain: Secondary | ICD-10-CM | POA: Diagnosis present

## 2023-11-14 DIAGNOSIS — Z9981 Dependence on supplemental oxygen: Secondary | ICD-10-CM | POA: Diagnosis not present

## 2023-11-14 DIAGNOSIS — E039 Hypothyroidism, unspecified: Secondary | ICD-10-CM | POA: Diagnosis not present

## 2023-11-14 DIAGNOSIS — S82201A Unspecified fracture of shaft of right tibia, initial encounter for closed fracture: Secondary | ICD-10-CM | POA: Diagnosis not present

## 2023-11-14 DIAGNOSIS — S82301A Unspecified fracture of lower end of right tibia, initial encounter for closed fracture: Secondary | ICD-10-CM | POA: Diagnosis not present

## 2023-11-14 DIAGNOSIS — I2510 Atherosclerotic heart disease of native coronary artery without angina pectoris: Secondary | ICD-10-CM

## 2023-11-14 DIAGNOSIS — S9304XA Dislocation of right ankle joint, initial encounter: Secondary | ICD-10-CM | POA: Diagnosis present

## 2023-11-14 DIAGNOSIS — Z91018 Allergy to other foods: Secondary | ICD-10-CM

## 2023-11-14 DIAGNOSIS — Z981 Arthrodesis status: Secondary | ICD-10-CM

## 2023-11-14 DIAGNOSIS — N179 Acute kidney failure, unspecified: Secondary | ICD-10-CM | POA: Diagnosis present

## 2023-11-14 DIAGNOSIS — E875 Hyperkalemia: Secondary | ICD-10-CM | POA: Diagnosis present

## 2023-11-14 DIAGNOSIS — R509 Fever, unspecified: Secondary | ICD-10-CM | POA: Diagnosis not present

## 2023-11-14 DIAGNOSIS — J449 Chronic obstructive pulmonary disease, unspecified: Secondary | ICD-10-CM | POA: Diagnosis not present

## 2023-11-14 DIAGNOSIS — R0902 Hypoxemia: Secondary | ICD-10-CM | POA: Diagnosis not present

## 2023-11-14 DIAGNOSIS — E119 Type 2 diabetes mellitus without complications: Secondary | ICD-10-CM | POA: Diagnosis not present

## 2023-11-14 DIAGNOSIS — J9611 Chronic respiratory failure with hypoxia: Secondary | ICD-10-CM | POA: Diagnosis present

## 2023-11-14 DIAGNOSIS — Z951 Presence of aortocoronary bypass graft: Secondary | ICD-10-CM

## 2023-11-14 DIAGNOSIS — F419 Anxiety disorder, unspecified: Secondary | ICD-10-CM | POA: Diagnosis present

## 2023-11-14 DIAGNOSIS — S8251XA Displaced fracture of medial malleolus of right tibia, initial encounter for closed fracture: Secondary | ICD-10-CM | POA: Diagnosis not present

## 2023-11-14 DIAGNOSIS — S82831A Other fracture of upper and lower end of right fibula, initial encounter for closed fracture: Secondary | ICD-10-CM | POA: Diagnosis not present

## 2023-11-14 DIAGNOSIS — F17210 Nicotine dependence, cigarettes, uncomplicated: Secondary | ICD-10-CM

## 2023-11-14 DIAGNOSIS — Z7982 Long term (current) use of aspirin: Secondary | ICD-10-CM

## 2023-11-14 DIAGNOSIS — I110 Hypertensive heart disease with heart failure: Secondary | ICD-10-CM | POA: Diagnosis present

## 2023-11-14 DIAGNOSIS — D649 Anemia, unspecified: Secondary | ICD-10-CM | POA: Diagnosis not present

## 2023-11-14 DIAGNOSIS — Z79899 Other long term (current) drug therapy: Secondary | ICD-10-CM

## 2023-11-14 DIAGNOSIS — Z90710 Acquired absence of both cervix and uterus: Secondary | ICD-10-CM

## 2023-11-14 DIAGNOSIS — F32A Depression, unspecified: Secondary | ICD-10-CM | POA: Diagnosis not present

## 2023-11-14 DIAGNOSIS — Z9109 Other allergy status, other than to drugs and biological substances: Secondary | ICD-10-CM

## 2023-11-14 DIAGNOSIS — E8889 Other specified metabolic disorders: Secondary | ICD-10-CM | POA: Diagnosis present

## 2023-11-14 DIAGNOSIS — J441 Chronic obstructive pulmonary disease with (acute) exacerbation: Secondary | ICD-10-CM | POA: Diagnosis not present

## 2023-11-14 DIAGNOSIS — R6510 Systemic inflammatory response syndrome (SIRS) of non-infectious origin without acute organ dysfunction: Secondary | ICD-10-CM | POA: Diagnosis present

## 2023-11-14 DIAGNOSIS — G8918 Other acute postprocedural pain: Secondary | ICD-10-CM | POA: Diagnosis not present

## 2023-11-14 DIAGNOSIS — Z7984 Long term (current) use of oral hypoglycemic drugs: Secondary | ICD-10-CM

## 2023-11-14 DIAGNOSIS — Z1152 Encounter for screening for COVID-19: Secondary | ICD-10-CM

## 2023-11-14 DIAGNOSIS — R8281 Pyuria: Secondary | ICD-10-CM | POA: Diagnosis present

## 2023-11-14 DIAGNOSIS — W19XXXA Unspecified fall, initial encounter: Principal | ICD-10-CM

## 2023-11-14 DIAGNOSIS — I959 Hypotension, unspecified: Secondary | ICD-10-CM | POA: Diagnosis not present

## 2023-11-14 DIAGNOSIS — E785 Hyperlipidemia, unspecified: Secondary | ICD-10-CM | POA: Diagnosis not present

## 2023-11-14 DIAGNOSIS — Z79890 Hormone replacement therapy (postmenopausal): Secondary | ICD-10-CM

## 2023-11-14 DIAGNOSIS — S99922A Unspecified injury of left foot, initial encounter: Secondary | ICD-10-CM | POA: Diagnosis not present

## 2023-11-14 DIAGNOSIS — J9621 Acute and chronic respiratory failure with hypoxia: Secondary | ICD-10-CM | POA: Diagnosis not present

## 2023-11-14 DIAGNOSIS — R8271 Bacteriuria: Secondary | ICD-10-CM | POA: Diagnosis present

## 2023-11-14 DIAGNOSIS — S82401A Unspecified fracture of shaft of right fibula, initial encounter for closed fracture: Secondary | ICD-10-CM | POA: Diagnosis not present

## 2023-11-14 DIAGNOSIS — Z818 Family history of other mental and behavioral disorders: Secondary | ICD-10-CM

## 2023-11-14 HISTORY — DX: Chronic obstructive pulmonary disease, unspecified: J44.9

## 2023-11-14 HISTORY — DX: Type 2 diabetes mellitus without complications: E11.9

## 2023-11-14 HISTORY — DX: Hypothyroidism, unspecified: E03.9

## 2023-11-14 HISTORY — DX: Tobacco use: Z72.0

## 2023-11-14 HISTORY — PX: ANKLE CLOSED REDUCTION: SHX880

## 2023-11-14 HISTORY — DX: Atherosclerotic heart disease of native coronary artery without angina pectoris: I25.10

## 2023-11-14 LAB — COMPREHENSIVE METABOLIC PANEL WITH GFR
ALT: 6 U/L (ref 0–44)
AST: 16 U/L (ref 15–41)
Albumin: 3.5 g/dL (ref 3.5–5.0)
Alkaline Phosphatase: 85 U/L (ref 38–126)
Anion gap: 4 — ABNORMAL LOW (ref 5–15)
BUN: 12 mg/dL (ref 8–23)
CO2: 34 mmol/L — ABNORMAL HIGH (ref 22–32)
Calcium: 9.2 mg/dL (ref 8.9–10.3)
Chloride: 101 mmol/L (ref 98–111)
Creatinine, Ser: 1.06 mg/dL — ABNORMAL HIGH (ref 0.44–1.00)
GFR, Estimated: 55 mL/min — ABNORMAL LOW (ref >60)
Glucose, Bld: 96 mg/dL (ref 70–99)
Potassium: 5.3 mmol/L — ABNORMAL HIGH (ref 3.5–5.1)
Sodium: 138 mmol/L (ref 135–145)
Total Bilirubin: <0.2 mg/dL (ref 0.0–1.2)
Total Protein: 6.4 g/dL — ABNORMAL LOW (ref 6.5–8.1)

## 2023-11-14 LAB — CBC WITH DIFFERENTIAL/PLATELET
Abs Immature Granulocytes: 0.13 K/uL — ABNORMAL HIGH (ref 0.00–0.07)
Basophils Absolute: 0.1 K/uL (ref 0.0–0.1)
Basophils Relative: 0 %
Eosinophils Absolute: 0.4 K/uL (ref 0.0–0.5)
Eosinophils Relative: 2 %
HCT: 36.2 % (ref 36.0–46.0)
Hemoglobin: 10.8 g/dL — ABNORMAL LOW (ref 12.0–15.0)
Immature Granulocytes: 1 %
Lymphocytes Relative: 19 %
Lymphs Abs: 3 K/uL (ref 0.7–4.0)
MCH: 28 pg (ref 26.0–34.0)
MCHC: 29.8 g/dL — ABNORMAL LOW (ref 30.0–36.0)
MCV: 93.8 fL (ref 80.0–100.0)
Monocytes Absolute: 1.2 K/uL — ABNORMAL HIGH (ref 0.1–1.0)
Monocytes Relative: 8 %
Neutro Abs: 11.1 K/uL — ABNORMAL HIGH (ref 1.7–7.7)
Neutrophils Relative %: 70 %
Platelets: 330 K/uL (ref 150–400)
RBC: 3.86 MIL/uL — ABNORMAL LOW (ref 3.87–5.11)
RDW: 15.9 % — ABNORMAL HIGH (ref 11.5–15.5)
WBC: 15.8 K/uL — ABNORMAL HIGH (ref 4.0–10.5)
nRBC: 0 % (ref 0.0–0.2)

## 2023-11-14 LAB — URINALYSIS, W/ REFLEX TO CULTURE (INFECTION SUSPECTED)
Bilirubin Urine: NEGATIVE
Glucose, UA: NEGATIVE mg/dL
Hgb urine dipstick: NEGATIVE
Ketones, ur: NEGATIVE mg/dL
Nitrite: NEGATIVE
Protein, ur: NEGATIVE mg/dL
Specific Gravity, Urine: 1.009 (ref 1.005–1.030)
pH: 5 (ref 5.0–8.0)

## 2023-11-14 LAB — VITAMIN D 25 HYDROXY (VIT D DEFICIENCY, FRACTURES): Vit D, 25-Hydroxy: 19.73 ng/mL — ABNORMAL LOW (ref 30–100)

## 2023-11-14 LAB — GLUCOSE, CAPILLARY
Glucose-Capillary: 111 mg/dL — ABNORMAL HIGH (ref 70–99)
Glucose-Capillary: 132 mg/dL — ABNORMAL HIGH (ref 70–99)
Glucose-Capillary: 140 mg/dL — ABNORMAL HIGH (ref 70–99)

## 2023-11-14 LAB — CREATININE, SERUM
Creatinine, Ser: 1.24 mg/dL — ABNORMAL HIGH (ref 0.44–1.00)
GFR, Estimated: 46 mL/min — ABNORMAL LOW (ref >60)

## 2023-11-14 LAB — CBC
HCT: 36.1 % (ref 36.0–46.0)
Hemoglobin: 10.7 g/dL — ABNORMAL LOW (ref 12.0–15.0)
MCH: 27.8 pg (ref 26.0–34.0)
MCHC: 29.6 g/dL — ABNORMAL LOW (ref 30.0–36.0)
MCV: 93.8 fL (ref 80.0–100.0)
Platelets: 204 K/uL (ref 150–400)
RBC: 3.85 MIL/uL — ABNORMAL LOW (ref 3.87–5.11)
RDW: 15.8 % — ABNORMAL HIGH (ref 11.5–15.5)
WBC: 12.5 K/uL — ABNORMAL HIGH (ref 4.0–10.5)
nRBC: 0 % (ref 0.0–0.2)

## 2023-11-14 LAB — RESP PANEL BY RT-PCR (RSV, FLU A&B, COVID)  RVPGX2
Influenza A by PCR: NEGATIVE
Influenza B by PCR: NEGATIVE
Resp Syncytial Virus by PCR: NEGATIVE
SARS Coronavirus 2 by RT PCR: NEGATIVE

## 2023-11-14 LAB — HEMOGLOBIN A1C
Hgb A1c MFr Bld: 5.6 % (ref 4.8–5.6)
Mean Plasma Glucose: 114.02 mg/dL

## 2023-11-14 SURGERY — CLOSED REDUCTION, ANKLE
Anesthesia: Monitor Anesthesia Care | Site: Ankle | Laterality: Right

## 2023-11-14 MED ORDER — OXYCODONE HCL 5 MG PO TABS
10.0000 mg | ORAL_TABLET | Freq: Four times a day (QID) | ORAL | Status: DC | PRN
  Administered 2023-11-15: 10 mg via ORAL
  Filled 2023-11-14: qty 2

## 2023-11-14 MED ORDER — PROPOFOL 10 MG/ML IV BOLUS
INTRAVENOUS | Status: AC
  Filled 2023-11-14: qty 20

## 2023-11-14 MED ORDER — FENTANYL CITRATE (PF) 100 MCG/2ML IJ SOLN: 25.0000 ug | INTRAMUSCULAR | Status: DC | PRN

## 2023-11-14 MED ORDER — ORAL CARE MOUTH RINSE: 15.0000 mL | Freq: Once | OROMUCOSAL | Status: AC

## 2023-11-14 MED ORDER — GLIPIZIDE ER 5 MG PO TB24
5.0000 mg | ORAL_TABLET | Freq: Two times a day (BID) | ORAL | Status: DC
Start: 2023-11-14 — End: 2023-11-15
  Filled 2023-11-14 (×2): qty 1

## 2023-11-14 MED ORDER — UMECLIDINIUM-VILANTEROL 62.5-25 MCG/ACT IN AEPB
1.0000 | INHALATION_SPRAY | Freq: Every day | RESPIRATORY_TRACT | Status: DC
  Administered 2023-11-15: 1 via RESPIRATORY_TRACT
  Filled 2023-11-14: qty 14

## 2023-11-14 MED ORDER — CHLORHEXIDINE GLUCONATE 0.12 % MT SOLN
15.0000 mL | Freq: Once | OROMUCOSAL | Status: AC
  Administered 2023-11-14: 15 mL via OROMUCOSAL

## 2023-11-14 MED ORDER — CHLORHEXIDINE GLUCONATE 0.12 % MT SOLN
OROMUCOSAL | Status: AC
  Filled 2023-11-14: qty 15

## 2023-11-14 MED ORDER — ACETAMINOPHEN 500 MG PO TABS
500.0000 mg | ORAL_TABLET | Freq: Once | ORAL | Status: AC
  Administered 2023-11-14: 500 mg via ORAL

## 2023-11-14 MED ORDER — ASPIRIN 81 MG PO TBEC
81.0000 mg | DELAYED_RELEASE_TABLET | Freq: Every day | ORAL | Status: DC
  Administered 2023-11-15 – 2023-11-26 (×12): 81 mg via ORAL
  Filled 2023-11-14 (×12): qty 1

## 2023-11-14 MED ORDER — FENTANYL CITRATE (PF) 250 MCG/5ML IJ SOLN
INTRAMUSCULAR | Status: AC
  Filled 2023-11-14: qty 5

## 2023-11-14 MED ORDER — OXYCODONE HCL 5 MG PO TABS
5.0000 mg | ORAL_TABLET | Freq: Four times a day (QID) | ORAL | Status: DC | PRN
  Administered 2023-11-14 – 2023-11-15 (×2): 5 mg via ORAL
  Filled 2023-11-14 (×2): qty 1

## 2023-11-14 MED ORDER — HYDROMORPHONE HCL 1 MG/ML IJ SOLN
0.5000 mg | Freq: Once | INTRAMUSCULAR | Status: AC
  Administered 2023-11-14: 0.5 mg via INTRAVENOUS

## 2023-11-14 MED ORDER — MORPHINE SULFATE (PF) 4 MG/ML IV SOLN
4.0000 mg | Freq: Once | INTRAVENOUS | Status: AC
  Administered 2023-11-14: 4 mg via INTRAVENOUS
  Filled 2023-11-14: qty 1

## 2023-11-14 MED ORDER — HYDROMORPHONE HCL 1 MG/ML IJ SOLN
0.5000 mg | Freq: Once | INTRAMUSCULAR | Status: AC
  Administered 2023-11-15: 0.5 mg via INTRAVENOUS
  Filled 2023-11-14 (×2): qty 0.5

## 2023-11-14 MED ORDER — ACETAMINOPHEN 500 MG PO TABS
ORAL_TABLET | ORAL | Status: AC
  Filled 2023-11-14: qty 1

## 2023-11-14 MED ORDER — CEFAZOLIN SODIUM-DEXTROSE 2-4 GM/100ML-% IV SOLN
INTRAVENOUS | Status: AC
  Filled 2023-11-14: qty 100

## 2023-11-14 MED ORDER — ONDANSETRON HCL 4 MG/2ML IJ SOLN
INTRAMUSCULAR | Status: DC | PRN
  Administered 2023-11-14: 4 mg via INTRAVENOUS

## 2023-11-14 MED ORDER — HYDROMORPHONE HCL 1 MG/ML IJ SOLN
INTRAMUSCULAR | Status: AC
  Filled 2023-11-14: qty 1

## 2023-11-14 MED ORDER — INSULIN ASPART 100 UNIT/ML IJ SOLN
0.0000 [IU] | Freq: Three times a day (TID) | INTRAMUSCULAR | Status: DC
  Administered 2023-11-15: 2 [IU] via SUBCUTANEOUS
  Administered 2023-11-16: 3 [IU] via SUBCUTANEOUS
  Administered 2023-11-17 – 2023-11-18 (×2): 2 [IU] via SUBCUTANEOUS
  Administered 2023-11-18 – 2023-11-19 (×3): 3 [IU] via SUBCUTANEOUS
  Administered 2023-11-20 (×2): 2 [IU] via SUBCUTANEOUS
  Administered 2023-11-21: 3 [IU] via SUBCUTANEOUS
  Administered 2023-11-21: 2 [IU] via SUBCUTANEOUS
  Administered 2023-11-22: 3 [IU] via SUBCUTANEOUS
  Administered 2023-11-22: 2 [IU] via SUBCUTANEOUS
  Administered 2023-11-22: 3 [IU] via SUBCUTANEOUS
  Administered 2023-11-23: 5 [IU] via SUBCUTANEOUS
  Administered 2023-11-24 – 2023-11-25 (×3): 3 [IU] via SUBCUTANEOUS
  Administered 2023-11-25: 5 [IU] via SUBCUTANEOUS
  Administered 2023-11-26: 3 [IU] via SUBCUTANEOUS

## 2023-11-14 MED ORDER — MIDAZOLAM HCL 2 MG/2ML IJ SOLN
INTRAMUSCULAR | Status: AC
  Filled 2023-11-14: qty 2

## 2023-11-14 MED ORDER — LEVOTHYROXINE SODIUM 50 MCG PO TABS
50.0000 ug | ORAL_TABLET | Freq: Every day | ORAL | Status: DC
  Administered 2023-11-15 – 2023-11-26 (×12): 50 ug via ORAL
  Filled 2023-11-14 (×11): qty 1

## 2023-11-14 MED ORDER — ACETAMINOPHEN 500 MG PO TABS
1000.0000 mg | ORAL_TABLET | Freq: Three times a day (TID) | ORAL | Status: DC
  Administered 2023-11-14 – 2023-11-16 (×6): 1000 mg via ORAL
  Filled 2023-11-14 (×9): qty 2

## 2023-11-14 MED ORDER — IPRATROPIUM-ALBUTEROL 0.5-2.5 (3) MG/3ML IN SOLN
3.0000 mL | Freq: Once | RESPIRATORY_TRACT | Status: AC
  Administered 2023-11-14: 3 mL via RESPIRATORY_TRACT
  Filled 2023-11-14: qty 3

## 2023-11-14 MED ORDER — ROCURONIUM BROMIDE 10 MG/ML (PF) SYRINGE
PREFILLED_SYRINGE | INTRAVENOUS | Status: AC
  Filled 2023-11-14: qty 10

## 2023-11-14 MED ORDER — GABAPENTIN 300 MG PO CAPS
300.0000 mg | ORAL_CAPSULE | Freq: Three times a day (TID) | ORAL | Status: DC
  Administered 2023-11-14 – 2023-11-26 (×35): 300 mg via ORAL
  Filled 2023-11-14 (×35): qty 1

## 2023-11-14 MED ORDER — ATORVASTATIN CALCIUM 80 MG PO TABS
80.0000 mg | ORAL_TABLET | Freq: Every day | ORAL | Status: DC
  Administered 2023-11-14 – 2023-11-26 (×13): 80 mg via ORAL
  Filled 2023-11-14 (×13): qty 1

## 2023-11-14 MED ORDER — PROPOFOL 500 MG/50ML IV EMUL
INTRAVENOUS | Status: DC | PRN
  Administered 2023-11-14: 75 ug/kg/min via INTRAVENOUS

## 2023-11-14 MED ORDER — INSULIN ASPART 100 UNIT/ML IJ SOLN: 0.0000 [IU] | INTRAMUSCULAR | Status: DC | PRN

## 2023-11-14 MED ORDER — DROPERIDOL 2.5 MG/ML IJ SOLN: 0.6250 mg | Freq: Once | INTRAMUSCULAR | Status: DC | PRN

## 2023-11-14 MED ORDER — BISOPROLOL FUMARATE 5 MG PO TABS
5.0000 mg | ORAL_TABLET | Freq: Every day | ORAL | Status: DC
  Administered 2023-11-15 – 2023-11-26 (×11): 5 mg via ORAL
  Filled 2023-11-14 (×13): qty 1

## 2023-11-14 MED ORDER — LIDOCAINE 2% (20 MG/ML) 5 ML SYRINGE
INTRAMUSCULAR | Status: AC
  Filled 2023-11-14: qty 5

## 2023-11-14 MED ORDER — SODIUM CHLORIDE 0.9 % IV SOLN
1.0000 g | Freq: Once | INTRAVENOUS | Status: AC
  Administered 2023-11-14: 1 g via INTRAVENOUS
  Filled 2023-11-14: qty 10

## 2023-11-14 MED ORDER — LISINOPRIL 20 MG PO TABS
20.0000 mg | ORAL_TABLET | Freq: Every day | ORAL | Status: DC
  Administered 2023-11-14 – 2023-11-25 (×10): 20 mg via ORAL
  Filled 2023-11-14 (×12): qty 1

## 2023-11-14 MED ORDER — ENOXAPARIN SODIUM 40 MG/0.4ML IJ SOSY
40.0000 mg | PREFILLED_SYRINGE | INTRAMUSCULAR | Status: DC
  Administered 2023-11-15 – 2023-11-23 (×10): 40 mg via SUBCUTANEOUS
  Filled 2023-11-14 (×9): qty 0.4

## 2023-11-14 MED ORDER — DULOXETINE HCL 30 MG PO CPEP
30.0000 mg | ORAL_CAPSULE | Freq: Two times a day (BID) | ORAL | Status: DC
  Administered 2023-11-14 – 2023-11-26 (×24): 30 mg via ORAL
  Filled 2023-11-14 (×24): qty 1

## 2023-11-14 MED ORDER — LACTATED RINGERS IV SOLN: INTRAVENOUS | Status: DC

## 2023-11-14 MED ORDER — AMLODIPINE BESYLATE 5 MG PO TABS
5.0000 mg | ORAL_TABLET | Freq: Every day | ORAL | Status: DC
  Administered 2023-11-14 – 2023-11-26 (×11): 5 mg via ORAL
  Filled 2023-11-14 (×13): qty 1

## 2023-11-14 MED ORDER — FENTANYL CITRATE (PF) 250 MCG/5ML IJ SOLN
INTRAMUSCULAR | Status: DC | PRN
  Administered 2023-11-14: 50 ug via INTRAVENOUS

## 2023-11-14 MED ORDER — ACETAMINOPHEN 325 MG PO TABS
650.0000 mg | ORAL_TABLET | Freq: Once | ORAL | Status: AC
  Administered 2023-11-14: 650 mg via ORAL
  Filled 2023-11-14: qty 2

## 2023-11-14 MED ORDER — KETAMINE HCL 50 MG/5ML IJ SOSY
PREFILLED_SYRINGE | INTRAMUSCULAR | Status: DC | PRN
  Administered 2023-11-14: 15 mg via INTRAVENOUS

## 2023-11-14 MED ORDER — METHOCARBAMOL 500 MG PO TABS
500.0000 mg | ORAL_TABLET | Freq: Three times a day (TID) | ORAL | Status: DC
  Administered 2023-11-14 – 2023-11-26 (×35): 500 mg via ORAL
  Filled 2023-11-14 (×36): qty 1

## 2023-11-14 MED ORDER — CEFAZOLIN SODIUM-DEXTROSE 2-4 GM/100ML-% IV SOLN: 2.0000 g | INTRAVENOUS | Status: AC

## 2023-11-14 MED ORDER — IPRATROPIUM-ALBUTEROL 0.5-2.5 (3) MG/3ML IN SOLN
RESPIRATORY_TRACT | Status: AC
  Filled 2023-11-14: qty 3

## 2023-11-14 MED ORDER — IPRATROPIUM-ALBUTEROL 0.5-2.5 (3) MG/3ML IN SOLN: 3.0000 mL | Freq: Four times a day (QID) | RESPIRATORY_TRACT | Status: DC | PRN

## 2023-11-14 MED ORDER — HYDROMORPHONE HCL 1 MG/ML IJ SOLN
0.5000 mg | Freq: Once | INTRAMUSCULAR | Status: AC
  Administered 2023-11-14: 0.5 mg via INTRAVENOUS
  Filled 2023-11-14: qty 0.5

## 2023-11-14 MED ADMIN — PROPOFOL 200 MG/20ML IV EMUL: 20 mg | INTRAVENOUS | NDC 00069020910

## 2023-11-14 MED ADMIN — Midazolam HCl Inj PF 2 MG/2ML (Base Equivalent): 1 mg | INTRAVENOUS | NDC 00409000125

## 2023-11-14 MED FILL — Ketamine HCl Soln Pref Syr 50 MG/5ML (10 MG/ML): INTRAMUSCULAR | Qty: 5 | Status: AC

## 2023-11-14 SURGICAL SUPPLY — 5 items
BNDG COHESIVE 6X5 TAN NS LF (GAUZE/BANDAGES/DRESSINGS) IMPLANT
BNDG ELASTIC 4X5.8 VLCR NS LF (GAUZE/BANDAGES/DRESSINGS) IMPLANT
PAD CAST 3X4 CTTN HI CHSV (CAST SUPPLIES) IMPLANT
PADDING CAST ABS COTTON 6X4 NS (CAST SUPPLIES) IMPLANT
SPLINT PLASTER CAST XFAST 5X30 (CAST SUPPLIES) IMPLANT

## 2023-11-14 NOTE — Progress Notes (Signed)
 Patient removed IV prior dilaudid being given. Patient verbalized that she is misdiagnosed and staff trying to kill her. Floor mats in place. Bed alarms on. Re oriented patient. Patient refuse to have IV placed back again. Informed Abigail Chavez, NP. Charge nurse notified.

## 2023-11-14 NOTE — ED Triage Notes (Addendum)
 Pt from [facility], pt fell, denies hitting head and no blood thinners. Pt broke right ankle. Axox2

## 2023-11-14 NOTE — Anesthesia Postprocedure Evaluation (Signed)
 Anesthesia Post Note  Patient: [name]  Procedure(s) Performed: CLOSED REDUCTION, ANKLE (Right: Ankle)     Patient location during evaluation: PACU Anesthesia Type: MAC Level of consciousness: awake and alert Pain management: pain level controlled Vital Signs Assessment: post-procedure vital signs reviewed and stable Respiratory status: spontaneous breathing, nonlabored ventilation, respiratory function stable and patient connected to nasal cannula oxygen  Cardiovascular status: stable and blood pressure returned to baseline Postop Assessment: no apparent nausea or vomiting Anesthetic complications: no   No notable events documented.  Last Vitals:  Vitals:   11/14/23 1506 11/14/23 1715  BP: (!) 158/44 (!) 144/42  Pulse: 67 64  Resp: 16 16  Temp: 37 C 36.9 C  SpO2: 91% 94%    Last Pain:  Vitals:   11/14/23 1715  TempSrc: Oral  PainSc:                  Cordella SQUIBB Toriano Aikey

## 2023-11-14 NOTE — Anesthesia Preprocedure Evaluation (Addendum)
 Anesthesia Evaluation  Patient identified by MRN, date of birth, ID band Patient awake    Reviewed: Allergy & Precautions, NPO status , Patient's Chart, lab work & pertinent test results  History of Anesthesia Complications (+) PONV and history of anesthetic complications  Airway Mallampati: II  TM Distance: >3 FB Neck ROM: Full    Dental no notable dental hx. (+) Edentulous Upper, Edentulous Lower   Pulmonary COPD (3L ATC),  COPD inhaler and oxygen  dependent, Current SmokerPatient did not abstain from smoking.    + decreased breath sounds      Cardiovascular hypertension, Pt. on medications and Pt. on home beta blockers + CAD and + Past MI   Rhythm:Regular Rate:Normal  ECHO 2023:  1. Left ventricular ejection fraction, by estimation, is 55 to 60%. The  left ventricle has normal function. The left ventricle demonstrates  regional wall motion abnormalities (see scoring diagram/findings for  description). Left ventricular diastolic  parameters are consistent with Grade I diastolic dysfunction (impaired  relaxation). Mildly reduced global longitudinal strain of -14%.   2. Right ventricular systolic function is normal. The right ventricular  size is normal. Tricuspid regurgitation signal is inadequate for assessing  PA pressure.   3. The mitral valve is grossly normal. Mild mitral valve regurgitation.   4. The aortic valve is tricuspid. Aortic valve regurgitation is not  visualized. Aortic valve sclerosis/calcification is present, without any  evidence of aortic stenosis. Aortic valve mean gradient measures 4.0 mmHg.   5. The inferior vena cava is normal in size with greater than 50%  respiratory variability, suggesting right atrial pressure of 3 mmHg.      Neuro/Psych   Anxiety Depression    negative neurological ROS     GI/Hepatic negative GI ROS, Neg liver ROS,,,  Endo/Other  diabetesHypothyroidism    Renal/GU negative  Renal ROS  negative genitourinary   Musculoskeletal  (+) Arthritis ,    Abdominal Normal abdominal exam  (+)   Peds  Hematology Lab Results      Component                Value               Date                      WBC                      15.8 (H)            11/14/2023                HGB                      10.8 (L)            11/14/2023                HCT                      36.2                11/14/2023                MCV                      93.8                11/14/2023  PLT                      330                 11/14/2023              Anesthesia Other Findings   Reproductive/Obstetrics                              Anesthesia Physical Anesthesia Plan  ASA: 3  Anesthesia Plan: MAC   Post-op Pain Management: Tylenol  PO (pre-op)* and Ketamine IV*   Induction: Intravenous  PONV Risk Score and Plan: 3 and Ondansetron , Dexamethasone, Treatment may vary due to age or medical condition and Propofol  infusion  Airway Management Planned: Simple Face Mask  Additional Equipment: None  Intra-op Plan:   Post-operative Plan:   Informed Consent: I have reviewed the patients History and Physical, chart, labs and discussed the procedure including the risks, benefits and alternatives for the proposed anesthesia with the patient or authorized representative who has indicated his/her understanding and acceptance.     Dental advisory given  Plan Discussed with: CRNA  Anesthesia Plan Comments: (- Will plan for MAC if closed reduction with pulmonary preservative adjuvants given her poor lung function. If more complex repair needed, likely will convert to spinal )         Anesthesia Quick Evaluation

## 2023-11-14 NOTE — Consult Note (Addendum)
 Orthopaedic Trauma Service (OTS) Consult   Patient ID: [name] MRN: [id number] DOB/AGE: 03/09/1950 73 y.o.   Reason for Consult: Persistent right ankle fracture dislocation Referring Physician: Carlin Gula, MD (EDP)   HPI: [name] is an 73 y.o. female with complex medical history including chronic back pain, hypertension, on supplemental oxygen , diabetes, active nicotine  use,  history of MI who sustained a ground-level fall.  Patient is quite somnolent in hallway 22 as she was just transported here from [facility].  She really is unable to tell me the circumstances of her fall.  She is also unable to really tell me as to whether or not she uses any ambulatory devices at baseline.  She just kind of staring off into space but answers simple yes and no questions.  She presented to [facility] with deformity to her right ankle.  Attempt at reduction was made but it was unsuccessful.  SABRA  She was not a candidate for ED sedation due to her O2 requirements   Past Medical History:  Diagnosis Date   Anxiety    Arthritis    Chronic back pain    Complication of anesthesia    Depression    Depression    Hypertension    Myocardial infarction (HCC)    x2.   PONV (postoperative nausea and vomiting)     Past Surgical History:  Procedure Laterality Date   ABDOMINAL HYSTERECTOMY     APPENDECTOMY     BACK SURGERY     x3; fusion with pins and rods   BREAST LUMPECTOMY Left    CHOLECYSTECTOMY     COLONOSCOPY N/A 11/18/2013   Procedure: COLONOSCOPY;  Surgeon: Claudis RAYMOND Rivet, MD;  [facility];  Service: Endoscopy;  Laterality: N/A;  1200   CORONARY ARTERY BYPASS GRAFT N/A 01/22/2013   Procedure: Coronary Artery Bypass Grafting Times Four Using Left Internal Mammary Artery and Right Saphenous Leg Vein Harvested Endoscopically;  Surgeon: Dallas KATHEE Jude, MD;  Location: [facility] OR;  Service: Open Heart Surgery;  Laterality: N/A;    ESOPHAGOGASTRODUODENOSCOPY (EGD) WITH PROPOFOL  N/A 11/23/2015   Procedure: ESOPHAGOGASTRODUODENOSCOPY (EGD) WITH PROPOFOL ;  Surgeon: Claudis RAYMOND Rivet, MD;  Location: [facility];  Service: Endoscopy;  Laterality: N/A;  12:35   EXPLORATORY LAPAROTOMY     LEFT HEART CATHETERIZATION WITH CORONARY ANGIOGRAM  01/22/2013   Procedure: LEFT HEART CATHETERIZATION WITH CORONARY ANGIOGRAM;  Surgeon: Dorn JINNY Lesches, MD;  Location: [facility] CATH LAB;  Service: Cardiovascular;;   ORIF ANKLE FRACTURE Left    SPINAL FUSION     x 2   TONSILLECTOMY     age 58   TUBAL LIGATION      Family History  Problem Relation Age of Onset   Alcohol  abuse Father    Schizophrenia Father    Alcohol  abuse Sister    Alcohol  abuse Brother    Heart disease Other     Social History:  reports that she has been smoking cigarettes and e-cigarettes. She started smoking about 58 years ago. She has a 29.4 pack-year smoking history. She has never used smokeless tobacco. She reports that she does not drink alcohol  and does not use drugs.  Allergies:  Allergies  Allergen Reactions   Black Pepper [Piper] Hives, Itching and Rash   Other Hives, Itching, Rash and Other (See Comments)    METALS   Tape Hives, Itching and Rash   Morphine  Other (See Comments)  hallucinations    Medications: I have reviewed the patient's current medications. Current Outpatient Medications  Medication Instructions   albuterol  (PROVENTIL  HFA;VENTOLIN  HFA) 108 (90 Base) MCG/ACT inhaler 2 puffs, Every 6 hours PRN   amLODipine  (NORVASC ) 5 mg, Oral, Daily   ANORO ELLIPTA  62.5-25 MCG/INH AEPB 1 puff, Daily   aspirin  EC 81 mg, Daily   atorvastatin  (LIPITOR ) 80 mg, Daily   bisoprolol  (ZEBETA ) 5 mg, Oral, Daily   DULoxetine  (CYMBALTA ) 30 mg, 2 times daily   gabapentin (NEURONTIN) 600 mg, Every 8 hours   glipiZIDE (GLUCOTROL XL) 5 mg, 2 times daily   HYDROcodone -acetaminophen  (NORCO) 7.5-325 MG tablet 1 tablet, Every 8 hours PRN   ipratropium-albuterol   (DUONEB) 0.5-2.5 (3) MG/3ML SOLN 3 mLs, 4 times daily   levothyroxine (SYNTHROID) 50 mcg, Daily   lisinopril  (ZESTRIL ) 20 mg, Daily   metFORMIN  (GLUCOPHAGE ) 500 mg, Daily with breakfast   ondansetron  (ZOFRAN ) 4 mg, 4 times daily PRN     Results for orders placed or performed during the hospital encounter of 11/14/23 (from the past 48 hours)  Comprehensive metabolic panel     Status: Abnormal   Collection Time: 11/14/23  4:13 AM  Result Value Ref Range   Sodium 138 135 - 145 mmol/L   Potassium 5.3 (H) 3.5 - 5.1 mmol/L   Chloride 101 98 - 111 mmol/L   CO2 34 (H) 22 - 32 mmol/L   Glucose, Bld 96 70 - 99 mg/dL    Comment: Glucose reference range applies only to samples taken after fasting for at least 8 hours.   BUN 12 8 - 23 mg/dL   Creatinine, Ser 8.93 (H) 0.44 - 1.00 mg/dL   Calcium  9.2 8.9 - 10.3 mg/dL   Total Protein 6.4 (L) 6.5 - 8.1 g/dL   Albumin  3.5 3.5 - 5.0 g/dL   AST 16 15 - 41 U/L    Comment: HEMOLYSIS AT THIS LEVEL MAY AFFECT RESULT   ALT 6 0 - 44 U/L   Alkaline Phosphatase 85 38 - 126 U/L   Total Bilirubin <0.2 0.0 - 1.2 mg/dL   GFR, Estimated 55 (L) >60 mL/min    Comment: (NOTE) Calculated using the CKD-EPI Creatinine Equation (2021)    Anion gap 4 (L) 5 - 15    Comment: Performed at [facility], [address], [place], [place] [address]  CBC with Differential     Status: Abnormal   Collection Time: 11/14/23  4:13 AM  Result Value Ref Range   WBC 15.8 (H) 4.0 - 10.5 K/uL   RBC 3.86 (L) 3.87 - 5.11 MIL/uL   Hemoglobin 10.8 (L) 12.0 - 15.0 g/dL   HCT 63.7 63.9 - 53.9 %   MCV 93.8 80.0 - 100.0 fL   MCH 28.0 26.0 - 34.0 pg   MCHC 29.8 (L) 30.0 - 36.0 g/dL   RDW 84.0 (H) 88.4 - 84.4 %   Platelets 330 150 - 400 K/uL   nRBC 0.0 0.0 - 0.2 %   Neutrophils Relative % 70 %   Neutro Abs 11.1 (H) 1.7 - 7.7 K/uL   Lymphocytes Relative 19 %   Lymphs Abs 3.0 0.7 - 4.0 K/uL   Monocytes Relative 8 %   Monocytes Absolute 1.2 (H) 0.1 - 1.0 K/uL   Eosinophils Relative 2 %    Eosinophils Absolute 0.4 0.0 - 0.5 K/uL   Basophils Relative 0 %   Basophils Absolute 0.1 0.0 - 0.1 K/uL   Immature Granulocytes 1 %   Abs Immature  Granulocytes 0.13 (H) 0.00 - 0.07 K/uL    Comment: Performed at [facility], [address], [place], [place] [address]  Urinalysis, w/ Reflex to Culture (Infection Suspected) -Urine, Clean Catch     Status: Abnormal   Collection Time: 11/14/23  4:14 AM  Result Value Ref Range   Specimen Source URINE, CLEAN CATCH    Color, Urine YELLOW YELLOW   APPearance HAZY (A) CLEAR   Specific Gravity, Urine 1.009 1.005 - 1.030   pH 5.0 5.0 - 8.0   Glucose, UA NEGATIVE NEGATIVE mg/dL   Hgb urine dipstick NEGATIVE NEGATIVE   Bilirubin Urine NEGATIVE NEGATIVE   Ketones, ur NEGATIVE NEGATIVE mg/dL   Protein, ur NEGATIVE NEGATIVE mg/dL   Nitrite NEGATIVE NEGATIVE   Leukocytes,Ua SMALL (A) NEGATIVE   RBC / HPF 0-5 0 - 5 RBC/hpf   WBC, UA 6-10 0 - 5 WBC/hpf    Comment:        Reflex urine culture not performed if WBC <=10, OR if Squamous epithelial cells >5. If Squamous epithelial cells >5 suggest recollection.    Bacteria, UA RARE (A) NONE SEEN   Squamous Epithelial / HPF 0-5 0 - 5 /HPF    Comment: Performed at [facility], [address] [place], [place] [address]  Resp panel by RT-PCR (RSV, Flu A&B, Covid) Anterior Nasal Swab     Status: None   Collection Time: 11/14/23  4:14 AM   Specimen: Anterior Nasal Swab  Result Value Ref Range   SARS Coronavirus 2 by RT PCR NEGATIVE NEGATIVE    Comment: (NOTE) SARS-CoV-2 target nucleic acids are NOT DETECTED.  The SARS-CoV-2 RNA is generally detectable in upper respiratory specimens during the acute phase of infection. The lowest concentration of SARS-CoV-2 viral copies this assay can detect is 138 copies/mL. A negative result does not preclude SARS-Cov-2 infection and should not be used as the sole basis for treatment or other patient management decisions. A negative result may occur with   improper specimen collection/handling, submission of specimen other than nasopharyngeal swab, presence of viral mutation(s) within the areas targeted by this assay, and inadequate number of viral copies(<138 copies/mL). A negative result must be combined with clinical observations, patient history, and epidemiological information. The expected result is Negative.  Fact Sheet for Patients:  BloggerCourse.com  Fact Sheet for Healthcare Providers:  SeriousBroker.it  This test is no t yet approved or cleared by the [place]  FDA and  has been authorized for detection and/or diagnosis of SARS-CoV-2 by FDA under an Emergency Use Authorization (EUA). This EUA will remain  in effect (meaning this test can be used) for the duration of the COVID-19 declaration under Section 564(b)(1) of the Act, 21 U.S.C.section 360bbb-3(b)(1), unless the authorization is terminated  or revoked sooner.       Influenza A by PCR NEGATIVE NEGATIVE   Influenza B by PCR NEGATIVE NEGATIVE    Comment: (NOTE) The Xpert Xpress SARS-CoV-2/FLU/RSV plus assay is intended as an aid in the diagnosis of influenza from Nasopharyngeal swab specimens and should not be used as a sole basis for treatment. Nasal washings and aspirates are unacceptable for Xpert Xpress SARS-CoV-2/FLU/RSV testing.  Fact Sheet for Patients: BloggerCourse.com  Fact Sheet for Healthcare Providers: SeriousBroker.it  This test is not yet approved or cleared by the [place]  FDA and has been authorized for detection and/or diagnosis of SARS-CoV-2 by FDA under an Emergency Use Authorization (EUA). This EUA will remain in effect (meaning this test can be used) for  the duration of the COVID-19 declaration under Section 564(b)(1) of the Act, 21 U.S.C. section 360bbb-3(b)(1), unless the authorization is terminated or revoked.     Resp  Syncytial Virus by PCR NEGATIVE NEGATIVE    Comment: (NOTE) Fact Sheet for Patients: BloggerCourse.com  Fact Sheet for Healthcare Providers: SeriousBroker.it  This test is not yet approved or cleared by the [place]  FDA and has been authorized for detection and/or diagnosis of SARS-CoV-2 by FDA under an Emergency Use Authorization (EUA). This EUA will remain in effect (meaning this test can be used) for the duration of the COVID-19 declaration under Section 564(b)(1) of the Act, 21 U.S.C. section 360bbb-3(b)(1), unless the authorization is terminated or revoked.  Performed at [facility], [address] [place], [place] [address]   Culture, blood (routine x 2)     Status: None (Preliminary result)   Collection Time: 11/14/23  6:28 AM   Specimen: BLOOD RIGHT ARM  Result Value Ref Range   Specimen Description      BLOOD RIGHT ARM BOTTLES DRAWN AEROBIC AND ANAEROBIC   Special Requests      Blood Culture results may not be optimal due to an inadequate volume of blood received in culture bottles Performed at [facility], [address] [place], [place] [address]    Culture PENDING    Report Status PENDING     DG Ankle Right Port Result Date: 11/14/2023 EXAM: 1 or 2 VIEW(S) XRAY OF THE RIGHT ANKLE 11/14/2023 05:43:22 AM CLINICAL HISTORY: [id number] Ankle pain [id number]. Ankle pain/ post reduction Ankle pain/ post reduction COMPARISON: Right ankle series dated 11/14/2023. FINDINGS: BONES AND JOINTS: Trimalleolar fracture dislocation of the ankle joint is again demonstrated without significant change in the degree of displacement and angulation. SOFT TISSUES: A splint has been placed over the patient's ankle and foot. IMPRESSION: 1. Trimalleolar fracture dislocation of the right ankle joint without significant change in the degree of displacement and angulation compared to the prior study. Electronically signed by: Evalene Coho MD  11/14/2023 05:48 AM EDT RP Workstation: [id number]   DG Ankle Complete Right Result Date: 11/14/2023 EXAM: 3 OR MORE VIEW(S) XRAY OF THE RIGHT ANKLE 11/14/2023 04:29:15 AM CLINICAL HISTORY: fever, fall, ?ankle fracture. Pt arrives from home w/ c/o mechanical fall. Denies hitting head, denies thinners. C/o rt ankle pain, obvious deformity. Arrives in c-collar. COMPARISON: None available. FINDINGS: BONES AND JOINTS: Trimalleolar fracture dislocation of the ankle, with mild displacement/deformity of the fracture fragments and mild lateral dislocation of the talus in relation to the tibial plafond. Transverse fracture of the medial malleolus, oblique fracture of the lateral malleolus and mildly displaced fracture of the posterior malleolus. Lateral and posterior subluxation of the talar dome in relation to the tibial plafond. SOFT TISSUES: Diffuse moderate circumferential soft tissue swelling about the ankle. IMPRESSION: 1. Trimalleolar fracture dislocation of the ankle with mild displacement/deformity of the fracture fragments and mild lateral dislocation of the talus in relation to the tibial plafond. 2. Lateral and posterior subluxation of the talar dome in relation to the tibial plafond. 3. Diffuse moderate circumferential soft tissue swelling about the ankle. Electronically signed by: Evalene Coho MD 11/14/2023 05:21 AM EDT RP Workstation: [id number]   DG Chest Port 1 View Result Date: 11/14/2023 EXAM: 1 VIEW(S) XRAY OF THE CHEST 11/14/2023 04:28:56 AM COMPARISON: PA and lateral radiographs of the chest dated 02/24/2013. CLINICAL HISTORY: fever, fall, ?ankle fracture. Pt arrives from home w/ c/o mechanical fall. Denies hitting head, denies thinners. C/o rt ankle pain, obvious  deformity. Arrives in c-collar. FINDINGS: LUNGS AND PLEURA: Hyperinflation. Chronic coarsened interstitial markings without pulmonary edema. Right apical pleural/pulmonary scarring. No focal pulmonary opacity. No pleural effusion.  No pneumothorax. HEART AND MEDIASTINUM: Surgical changes in mediastinum. Aortic calcification. Intact sternotomy wires. No acute abnormality of the cardiac and mediastinal silhouettes. BONES AND SOFT TISSUES: No acute osseous abnormality. IMPRESSION: 1. No acute process. Electronically signed by: Evalene Coho MD 11/14/2023 05:20 AM EDT RP Workstation: [id number]    Intake/Output    None      ROS As above Blood pressure (!) 155/55, pulse 61, temperature 98.7 F (37.1 C), temperature source Oral, resp. rate 18, SpO2 98%. Physical Exam  Gen: Sitting up in gurney, eyes are open but she is minimally interactive, staring off into space.  Able to give simple yes or no answers Cardiac: S1 and S2 Abdomen: Soft, nontender  Right lower extremity  Short leg splint is in place.  Leg is externally rotated and is supported on pillows  I did partially split the Ace wrap over her medial ankle  I was able to visualize small portion of her anterior ankle and there is extensive swelling and bruising present already.  I did not remove the splint in its entirety  Extremity is warm  She is not really cooperating with motor or sensory exam  She is responsive to painful stimuli  No perceivable knee tenderness with evaluation  No appreciable hip pain with axial loading or logrolling of her hip   Assessment/Plan:  73 year old female chronically ill with acute right trimalleolar ankle fracture dislocation  - Ground-level fall  -Fragility fracture right ankle with right trimalleolar ankle fracture dislocation with persistent dislocation  OR today for closed reduction with or without application of external fixator  Based off the soft tissue that I saw on exam she is too swollen for ORIF today and this will need to be delayed for 7 to 10 days  Will obtain CT scan post external fixation or close reduction to evaluate the size of her posterior malleolus fragment   Admit to the hospitalist given her  chronic medical issues she does seem very fragile medically   PT and OT evaluations postop.  Still need to get a clear understanding of her baseline function and help that she has at home   - Pain management:  Multimodal, minimize narcotics  - Medical issues   Per primary  - DVT/PE prophylaxis:  Lovenox  postop  - ID:   Perioperative antibiotics  - Metabolic Bone Disease:  Poor bone quality as evidenced by fragility fracture  Will check some basic labs.  Do not see a DEXA in our system  - Activity:  Bedrest for now.  Therapies postop  - FEN/GI prophylaxis/Foley/Lines:  Continue n.p.o.  - Impediments to fracture healing:  Diabetes  COPD/supplemental oxygen   Fragility fracture  Nicotine  dependence  - Dispo:  OR today to address right ankle fracture dislocation   Francis MICAEL Mt, PA-C [phone] (C) 11/14/2023, 9:55 AM  [facility] [address] [place] [address] [phone] [phone] (F)    After 5pm and on the weekends please log on to Amion, go to orthopaedics and the look under the [facility] Call for the provider(s) on call. You can also call our office at [phone] and then follow the prompts to be connected to the call team.

## 2023-11-14 NOTE — Op Note (Signed)
 11/14/2023  2:11 PM  PATIENT:  [name]  73 y.o. female  PRE-OPERATIVE DIAGNOSIS:  RIGHT ANKLE FRACTURE  #[id number]

## 2023-11-14 NOTE — ED Provider Notes (Signed)
  Physical Exam  BP (!) 155/63   Pulse 71   Temp 99.1 F (37.3 C) (Other (Comment))   Resp 18   SpO2 92%   Physical Exam Cardiovascular:     Rate and Rhythm: Normal rate and regular rhythm.  Pulmonary:     Effort: Pulmonary effort is normal.     Breath sounds: Normal breath sounds.     Comments: 3L [place] Skin:    Comments: Splint on right ankle, good cap refill     Procedures  Procedures  ED Course / MDM   Clinical Course as of 11/14/23 0929  Sat Nov 14, 2023  0430 I personally viewed the images from radiology studies and awaiting radiologist interpretation: ankle xray shows displaced trimaleolar fracture.  [CS]  0433 CBC with leukocytosis, mild anemia, no recent for comparison. Meets SIRS but no apparent infection by history. Will await further testing to determine if bacterial source is likely.  [CS]  0450 CMP is unremarkable.  [CS]  0503 Covid/Flu/RSV swab is neg.  [CS]  0523 UA as possible source of fever. Will give Rocephin, add cultures. Ankle splinted with RN at bedside. Patient tolerated well, but not a candidate for ED sedation, so will check xray to determine improved alignment.  [CS]  [phone] I personally viewed the images from radiology studies and agree with radiologist interpretation: CXR is clear [CS]  [phone] I personally viewed the images from radiology studies and agree with radiologist interpretation: Xray shows no significant change in alignment of ankle fracture.  [CS]  [id number] Spoke with Dr. Celena, Ortho, who requests the patient be sent to [facility] for evaluation. May take to OR today, but if not Hospitalist will need to admit [CS]  0927 Deward Eck PA-C ortho recommending admission to medical team here at [facility]. Will take to OR later today. NPO. [JB]  [phone] Hospitalist will come see for admission.  [JB]    Clinical Course User Index [CS] Christine Carlin NOVAK, MD [JB] Kailand Seda, Warren SAILOR, PA-C   Medical Decision Making Amount and/or Complexity of Data Reviewed Labs:  ordered. Radiology: ordered.  Risk OTC drugs. Prescription drug management. Decision regarding hospitalization.   Patient coming in [facility].  Orthopedics will see her they are planning on taking her to the OR today.  She will need to remain NPO.  I will get her admitted.  Hospitalist will come see.  She is not fever right now but did have fever at some point earlier.  She has gotten Rocephin and presumably is being treated for UTI.  She reports right ankle pain but has no other complaints.        Shermon Warren SAILOR, PA-C 11/14/23 0930    Rogelia Jerilynn RAMAN, MD 11/14/23 [phone]

## 2023-11-14 NOTE — ED Triage Notes (Signed)
 Pt arrives from home w/ c/o mechanical fall. Denies hitting head, denies thinners. C/o rt ankle pain, obvious deformity. Arrives in c-collar.  18 G LAC  100mcg fent given en route  156/78 105 CBG  3L o2 at all times.

## 2023-11-14 NOTE — Transfer of Care (Signed)
 Immediate Anesthesia Transfer of Care Note  Patient: [name]  Procedure(s) Performed: CLOSED REDUCTION, ANKLE (Right: Ankle)  Patient Location: PACU  Anesthesia Type:MAC  Level of Consciousness: drowsy  Airway & Oxygen  Therapy: Patient Spontanous Breathing and Patient connected to face mask oxygen   Post-op Assessment: Report given to RN and Post -op Vital signs reviewed and stable  Post vital signs: Reviewed and stable  Last Vitals:  Vitals Value Taken Time  BP 164/45 11/14/23 13:52  Temp    Pulse 59 11/14/23 13:54  Resp 15 11/14/23 13:54  SpO2 100 % 11/14/23 13:54  Vitals shown include unfiled device data.  Last Pain:  Vitals:   11/14/23 1015  TempSrc: Oral  PainSc: 0-No pain         Complications: No notable events documented.

## 2023-11-14 NOTE — ED Provider Notes (Signed)
 [place] EMERGENCY DEPARTMENT AT [facility]  Provider Note  CSN: [id number] Arrival date & time: 11/14/23 0355  History Chief Complaint  Patient presents with   [name] is a 73 y.o. female brought to the ED via EMS for evaluation after a fall. She reports she fell getting out of bed to go to the bathroom. Per EMS, deformity of R ankle. Given fentanyl  enroute. No head injury or LOC. Not on anticoagulation. She reports feeling fatigued earlier in the day but no acute symptoms otherwise including change in chronic cough, NVD or dysuria   Home Medications Prior to Admission medications   Medication Sig Start Date End Date Taking? Authorizing Provider  albuterol  (PROVENTIL  HFA;VENTOLIN  HFA) 108 (90 Base) MCG/ACT inhaler Inhale 2 puffs into the lungs every 6 (six) hours as needed for wheezing or shortness of breath.    [provider]  amLODipine  (NORVASC ) 5 MG tablet Take 1 tablet by mouth once daily 06/25/23   Alvan Dorn FALCON, MD  ANORO ELLIPTA  62.5-25 MCG/INH AEPB Inhale 1 puff into the lungs daily.  01/08/16   [provider]  aspirin  EC 81 MG tablet Take 81 mg by mouth daily.    [provider]  atorvastatin  (LIPITOR ) 80 MG tablet Take 80 mg by mouth daily.    [provider]  bisoprolol  (ZEBETA ) 5 MG tablet Take 1 tablet by mouth once daily 06/30/23   Alvan Dorn FALCON, MD  DULoxetine  (CYMBALTA ) 30 MG capsule Take 30 mg by mouth 2 (two) times daily. 12/03/20   [provider]  gabapentin (NEURONTIN) 600 MG tablet Take 600 mg by mouth every 8 (eight) hours. 06/12/21   [provider]  glipiZIDE (GLUCOTROL XL) 5 MG 24 hr tablet Take 5 mg by mouth 2 (two) times daily. 01/02/22   [provider]  HYDROcodone -acetaminophen  (NORCO) 7.5-325 MG tablet Take 1 tablet by mouth every 8 (eight) hours as needed. 12/18/21   [provider]  ipratropium-albuterol  (DUONEB) 0.5-2.5 (3) MG/3ML SOLN Take 3  mLs by nebulization 4 (four) times daily. 12/28/20   [provider]  levothyroxine (SYNTHROID) 50 MCG tablet Take 50 mcg by mouth daily. 01/03/22   [provider]  lisinopril  (ZESTRIL ) 20 MG tablet Take 20 mg by mouth daily. 01/02/22   [provider]  metFORMIN  (GLUCOPHAGE ) 500 MG tablet Take 500 mg by mouth daily with breakfast.    [provider]  ondansetron  (ZOFRAN ) 4 MG tablet Take 4 mg by mouth 4 (four) times daily as needed. 09/17/22   [provider]     Allergies    Black pepper [piper], Other, and Tape   Review of Systems   Review of Systems Please see HPI for pertinent positives and negatives  Physical Exam BP (!) 169/54   Pulse 64   Temp (!) 101.1 F (38.4 C) (Oral)   Resp 20   SpO2 92%   Physical Exam Vitals and nursing note reviewed.  Constitutional:      Appearance: Normal appearance.  HENT:     Head: Normocephalic and atraumatic.     Nose: Nose normal.     Mouth/Throat:     Mouth: Mucous membranes are moist.  Eyes:     Extraocular Movements: Extraocular movements intact.     Conjunctiva/sclera: Conjunctivae normal.  Cardiovascular:     Rate and Rhythm: Normal rate.     Pulses: Normal pulses.  Pulmonary:     Effort: Pulmonary effort is  normal.     Breath sounds: Wheezing and rhonchi present. No rales.  Abdominal:     General: Abdomen is flat.     Palpations: Abdomen is soft.     Tenderness: There is no abdominal tenderness.  Musculoskeletal:        General: Swelling and tenderness (swelling, bruising tenderness to R superier ankle) present. Normal range of motion.     Cervical back: Neck supple. No tenderness (no midline tenderness, c-collar cleared).  Skin:    General: Skin is warm and dry.  Neurological:     General: No focal deficit present.     Mental Status: She is alert.  Psychiatric:        Mood and Affect: Mood normal.     ED Results / Procedures / Treatments   EKG EKG  Interpretation Date/Time:  Saturday November 14 2023 05:03:39 EDT Ventricular Rate:  63 PR Interval:  153 QRS Duration:  94 QT Interval:  433 QTC Calculation: 444 R Axis:   83  Text Interpretation: Sinus rhythm Borderline right axis deviation Low voltage, precordial leads Abnormal T, consider ischemia, lateral leads Interpretation limited secondary to artifact No significant change since last tracing Confirmed by Roselyn Dunnings [phone]) on 11/14/2023 5:10:41 AM  Procedures .Splint Application  Date/Time: 11/14/2023 5:33 AM  Performed by: Roselyn Dunnings NOVAK, MD Authorized by: Roselyn Dunnings NOVAK, MD   Consent:    Consent obtained:  Verbal   Consent given by:  Patient Pre-procedure details:    Distal neurologic exam:  Normal   Distal perfusion: distal pulses strong and brisk capillary refill   Procedure details:    Location:  Ankle   Ankle location:  R ankle   Splint type:  Short leg   Supplies:  Fiberglass   Attestation: Splint applied and adjusted personally by me   Post-procedure details:    Distal neurologic exam:  Normal   Distal perfusion: brisk capillary refill and unchanged     Procedure completion:  Tolerated well, no immediate complications   Post-procedure imaging: reviewed     Medications Ordered in the ED Medications  cefTRIAXone (ROCEPHIN) 1 g in sodium chloride  0.9 % 100 mL IVPB (has no administration in time range)  acetaminophen  (TYLENOL ) tablet 650 mg (650 mg Oral Given 11/14/23 0412)  morphine  (PF) 4 MG/ML injection 4 mg (4 mg Intravenous Given 11/14/23 0454)    Initial Impression and Plan  Patient here after mechanical fall with concern for ankle fracture noted to be febrile on arrival. Will check labs and CXR both for source of fever and for pre-op clearance if necessary. Pain controlled with meds given by EMS.   ED Course   Clinical Course as of 11/14/23 9391  Sat Nov 14, 2023  0430 I personally viewed the images from radiology studies and  awaiting radiologist interpretation: ankle xray shows displaced trimaleolar fracture.  [CS]  0433 CBC with leukocytosis, mild anemia, no recent for comparison. Meets SIRS but no apparent infection by history. Will await further testing to determine if bacterial source is likely.  [CS]  0450 CMP is unremarkable.  [CS]  0503 Covid/Flu/RSV swab is neg.  [CS]  0523 UA as possible source of fever. Will give Rocephin, add cultures. Ankle splinted with RN at bedside. Patient tolerated well, but not a candidate for ED sedation, so will check xray to determine improved alignment.  [CS]  [id number] I personally viewed the images from radiology studies and agree with radiologist interpretation: CXR is clear [CS]  0552 I  personally viewed the images from radiology studies and agree with radiologist interpretation: Xray shows no significant change in alignment of ankle fracture.  [CS]  9395 Spoke with Dr. Celena, Ortho, who requests the patient be sent to [facility] for evaluation. May take to OR today, but if not Hospitalist will need to admit [CS]    Clinical Course User Index [CS] Roselyn Carlin NOVAK, MD     MDM Rules/Calculators/A&P Medical Decision Making Problems Addressed: Closed fracture dislocation of right ankle, initial encounter: acute illness or injury Fall, initial encounter: acute illness or injury Febrile illness: acute illness or injury  Amount and/or Complexity of Data Reviewed Labs: ordered. Decision-making details documented in ED Course. Radiology: ordered and independent interpretation performed. Decision-making details documented in ED Course. ECG/medicine tests: ordered and independent interpretation performed. Decision-making details documented in ED Course.  Risk OTC drugs. Prescription drug management. Parenteral controlled substances. Decision regarding hospitalization.     Final Clinical Impression(s) / ED Diagnoses Final diagnoses:  Fall, initial encounter  Closed fracture  dislocation of right ankle, initial encounter  Febrile illness    Rx / DC Orders ED Discharge Orders     None        Roselyn Carlin NOVAK, MD 11/14/23 [phone]

## 2023-11-14 NOTE — Op Note (Signed)
 NAME: [name], [name] MEDICAL RECORD NO: [id number] ACCOUNT NO: [id number] DATE OF BIRTH: [birth year] FACILITY: [facility] LOCATION: [facility] PHYSICIAN: Ozell DEL. Celena, MD  Operative Report   DATE OF PROCEDURE: 11/14/2023  PREOPERATIVE DIAGNOSIS:  Right ankle trimalleolar fracture/dislocation.  POSTOPERATIVE DIAGNOSIS:  Right ankle trimalleolar fracture/dislocation.  PROCEDURES:  Closed reduction with manipulation of right ankle trimalleolar fracture/dislocation.  SURGEON:  Ozell Celena, MD.  ASSISTANT:  None.  ANESTHESIA:  MAC.  COMPLICATIONS:  None.  BRIEF SUMMARY OF INDICATIONS FOR PROCEDURE:  The patient is a 73 year old oxygen  dependent female who underwent attempted closed reduction in the ED, but could not tolerate this and had oxygen  desaturation into the low 80s even with very light sedation.   Consequently, decision was made to proceed to the operating room.  The patient's skin was markedly swollen and not suitable for internal fixation as would have been additionally desired.  BRIEF SUMMARY OF PROCEDURE:  The patient was taken to the operating room where she was sedated.  I removed the splint on the right lower extremity, which did show considerable swelling, but no fracture blisters.  I brought in the C-arm and manipulated  the ankle to achieve reduction.  It was quite unstable.  After it was reduced, I had an assistant hold it while I applied a posterior stirrup splint.  I then performed a manipulative reduction based primarily on the Quigley maneuver and checked it on  mortise and lateral views, applying and keeping the mold in place until the posterior stirrup splint had completely hardened.  At that point, the patient was awakened from anesthesia and transported to the PACU in stable condition.  C-arm was present and  assisted me throughout, and the possibility of over reduction or medialization of the ankle was possible, so I was grateful for the C-arm to be there to  confirm the appropriate amount, but not without being excessive in terms of reduction force.  PROGNOSIS:  The patient will likely stay in the hospital for several days and should be a candidate for definitive open reduction internal fixation at that time.  Should she be able physically to go home, then we would shoot for surgery in about 10 days.   PUS D: 11/14/2023 2:16:03 pm T: 11/14/2023 2:55:00 pm  JOB: [id number]/ [id number]

## 2023-11-14 NOTE — ED Notes (Signed)
 EDP at bedside. Splint placed on ankle.

## 2023-11-14 NOTE — H&P (Addendum)
 History and Physical    Patient: [name] FMW:[id number] DOB: 01-21-1951 DOA: 11/14/2023 DOS: the patient was seen and examined on 11/14/2023 PCP: Rosamond Leta NOVAK, MD  Patient coming from: Home  Chief Complaint:  Chief Complaint  Patient presents with   Fall   HPI: [name] is a 73 y.o. female with medical history significant of COPD (on 3L [place]), HTN, HLD, DM2, and hypothyroidism p/w GLF c/b R ankle fracture.   The patient presented after experiencing a fall at 0330 this morning while attempting to use the bathroom. Per her daughter, the patient woke her up to use the restroom (she sleeps in another bed nearby in the same room), and when she went to assist her mom to the bedside commode, her mom slumped down (did not have LOC or hit her head), and had immediate R ankle pain; as such, she was unable to get up and EMS was called and pt BIBA to ED.   In the ED, the pt hypertensive, tachypneic on home 3L [place], and febrile 101.1 x1. Labs notable for K 5.3, and WBC 15.8. UA positive for pyuria and bacteruria. R ankle XR showed trimalleolar fracture dislocation of the ankle with mild displacement/deformity of the fracture fragments and mild lateral dislocation of the talus in relation to the tibial plafond. EDP consulted orthopedic surgery who plan for OR surgical repair today and requested medicine admission.   Review of Systems: As mentioned in the history of present illness. All other systems reviewed and are negative. Past Medical History:  Diagnosis Date   Anxiety    Arthritis    Chronic back pain    Complication of anesthesia    COPD (chronic obstructive pulmonary disease) (HCC)    Coronary artery disease    Current nicotine  use    Depression    Depression    Diabetes (HCC)    Hypertension    Hypothyroidism    Myocardial infarction (HCC)    x2.   PONV (postoperative nausea and vomiting)    Past Surgical History:  Procedure Laterality Date   ABDOMINAL HYSTERECTOMY      APPENDECTOMY     BACK SURGERY     x3; fusion with pins and rods   BREAST LUMPECTOMY Left    CHOLECYSTECTOMY     COLONOSCOPY N/A 11/18/2013   Procedure: COLONOSCOPY;  Surgeon: Claudis RAYMOND Rivet, MD;  Location: [facility];  Service: Endoscopy;  Laterality: N/A;  1200   CORONARY ARTERY BYPASS GRAFT N/A 01/22/2013   Procedure: Coronary Artery Bypass Grafting Times Four Using Left Internal Mammary Artery and Right Saphenous Leg Vein Harvested Endoscopically;  Surgeon: Dallas NOVAK Jude, MD;  Location: [facility] OR;  Service: Open Heart Surgery;  Laterality: N/A;   ESOPHAGOGASTRODUODENOSCOPY (EGD) WITH PROPOFOL  N/A 11/23/2015   Procedure: ESOPHAGOGASTRODUODENOSCOPY (EGD) WITH PROPOFOL ;  Surgeon: Claudis RAYMOND Rivet, MD;  Location: [facility];  Service: Endoscopy;  Laterality: N/A;  12:35   EXPLORATORY LAPAROTOMY     LEFT HEART CATHETERIZATION WITH CORONARY ANGIOGRAM  01/22/2013   Procedure: LEFT HEART CATHETERIZATION WITH CORONARY ANGIOGRAM;  Surgeon: Dorn JINNY Lesches, MD;  Location: [facility] CATH LAB;  Service: Cardiovascular;;   ORIF ANKLE FRACTURE Left    SPINAL FUSION     x 2   TONSILLECTOMY     age 89   TUBAL LIGATION     Social History:  reports that she has been smoking cigarettes and e-cigarettes. She started smoking about 58 years ago. She has a 29.4 pack-year smoking  history. She has never used smokeless tobacco. She reports that she does not drink alcohol  and does not use drugs.  Allergies  Allergen Reactions   Black Pepper [[place]] Hives, Itching and Rash   Other Hives, Itching, Rash and Other (See Comments)    METALS   Tape Hives, Itching and Rash   Morphine  Other (See Comments)    hallucinations    Family History  Problem Relation Age of Onset   Alcohol  abuse Father    Schizophrenia Father    Alcohol  abuse Sister    Alcohol  abuse Brother    Heart disease Other     Prior to Admission medications   Medication Sig Start Date End Date Taking? Authorizing Provider  albuterol   (PROVENTIL  HFA;VENTOLIN  HFA) 108 (90 Base) MCG/ACT inhaler Inhale 2 puffs into the lungs every 6 (six) hours as needed for wheezing or shortness of breath.   Yes [provider]  amLODipine  (NORVASC ) 5 MG tablet Take 1 tablet by mouth once daily 06/25/23  Yes Branch, Dorn FALCON, MD  ANORO ELLIPTA  62.5-25 MCG/INH AEPB Inhale 1 puff into the lungs daily.  01/08/16  Yes [provider]  aspirin  EC 81 MG tablet Take 81 mg by mouth daily.   Yes [provider]  atorvastatin  (LIPITOR ) 80 MG tablet Take 80 mg by mouth daily.   Yes [provider]  bisoprolol  (ZEBETA ) 5 MG tablet Take 1 tablet by mouth once daily 06/30/23  Yes Branch, Dorn FALCON, MD  DULoxetine  (CYMBALTA ) 30 MG capsule Take 30 mg by mouth 2 (two) times daily. 12/03/20  Yes [provider]  gabapentin (NEURONTIN) 600 MG tablet Take 600 mg by mouth every 8 (eight) hours. 06/12/21  Yes [provider]  glipiZIDE (GLUCOTROL XL) 5 MG 24 hr tablet Take 5 mg by mouth 2 (two) times daily. 01/02/22  Yes [provider]  HYDROcodone -acetaminophen  (NORCO) 7.5-325 MG tablet Take 1 tablet by mouth every 8 (eight) hours as needed. 12/18/21  Yes [provider]  ipratropium-albuterol  (DUONEB) 0.5-2.5 (3) MG/3ML SOLN Take 3 mLs by nebulization 4 (four) times daily. 12/28/20  Yes [provider]  levothyroxine (SYNTHROID) 50 MCG tablet Take 50 mcg by mouth daily. 01/03/22  Yes [provider]  lisinopril  (ZESTRIL ) 20 MG tablet Take 20 mg by mouth daily. 01/02/22  Yes [provider]  metFORMIN  (GLUCOPHAGE ) 500 MG tablet Take 500 mg by mouth daily with breakfast.   Yes [provider]  ondansetron  (ZOFRAN ) 4 MG tablet Take 4 mg by mouth 4 (four) times daily as needed. 09/17/22   [provider]    Physical Exam: Vitals:   11/14/23 0730 11/14/23 0924 11/14/23 1006 11/14/23 1015  BP: (!) 155/63 (!) 155/55 (!) 169/52   Pulse: 71 61 69 68  Resp: 18 18   (!) 21  Temp:  98.7 F (37.1 C) 100 F (37.8 C) 100 F (37.8 C)  TempSrc:  Oral  Oral  SpO2: 92% 98% 96%   Weight:    60.8 kg  Height:    5' 4 (1.626 m)   General: Alert, oriented x3, resting comfortably in no acute distress Respiratory: Lungs clear to auscultation bilaterally with normal respiratory effort; no w/r/r Cardiovascular: Regular rate and rhythm w/o m/r/g MSK: R ankle wrapped heavily c/d/i   Data Reviewed:  Lab Results  Component Value Date   WBC 15.8 (H) 11/14/2023   HGB 10.8 (L) 11/14/2023   HCT 36.2 11/14/2023   MCV 93.8 11/14/2023   PLT 330  11/14/2023   Lab Results  Component Value Date   GLUCOSE 96 11/14/2023   CALCIUM  9.2 11/14/2023   NA 138 11/14/2023   K 5.3 (H) 11/14/2023   CO2 34 (H) 11/14/2023   CL 101 11/14/2023   BUN 12 11/14/2023   CREATININE 1.06 (H) 11/14/2023   Lab Results  Component Value Date   ALT 6 11/14/2023   AST 16 11/14/2023   ALKPHOS 85 11/14/2023   BILITOT <0.2 11/14/2023   Lab Results  Component Value Date   INR 1.51 (H) 01/22/2013   Radiology: DG Ankle Right Port Result Date: 11/14/2023 EXAM: 1 or 2 VIEW(S) XRAY OF THE RIGHT ANKLE 11/14/2023 05:43:22 AM CLINICAL HISTORY: [id number] Ankle pain 125031. Ankle pain/ post reduction Ankle pain/ post reduction COMPARISON: Right ankle series dated 11/14/2023. FINDINGS: BONES AND JOINTS: Trimalleolar fracture dislocation of the ankle joint is again demonstrated without significant change in the degree of displacement and angulation. SOFT TISSUES: A splint has been placed over the patient's ankle and foot. IMPRESSION: 1. Trimalleolar fracture dislocation of the right ankle joint without significant change in the degree of displacement and angulation compared to the prior study. Electronically signed by: Evalene Coho MD 11/14/2023 05:48 AM EDT RP Workstation: [id number]   DG Ankle Complete Right Result Date: 11/14/2023 EXAM: 3 OR MORE VIEW(S) XRAY OF THE RIGHT ANKLE 11/14/2023  04:29:15 AM CLINICAL HISTORY: fever, fall, ?ankle fracture. Pt arrives from home w/ c/o mechanical fall. Denies hitting head, denies thinners. C/o rt ankle pain, obvious deformity. Arrives in c-collar. COMPARISON: None available. FINDINGS: BONES AND JOINTS: Trimalleolar fracture dislocation of the ankle, with mild displacement/deformity of the fracture fragments and mild lateral dislocation of the talus in relation to the tibial plafond. Transverse fracture of the medial malleolus, oblique fracture of the lateral malleolus and mildly displaced fracture of the posterior malleolus. Lateral and posterior subluxation of the talar dome in relation to the tibial plafond. SOFT TISSUES: Diffuse moderate circumferential soft tissue swelling about the ankle. IMPRESSION: 1. Trimalleolar fracture dislocation of the ankle with mild displacement/deformity of the fracture fragments and mild lateral dislocation of the talus in relation to the tibial plafond. 2. Lateral and posterior subluxation of the talar dome in relation to the tibial plafond. 3. Diffuse moderate circumferential soft tissue swelling about the ankle. Electronically signed by: Evalene Coho MD 11/14/2023 05:21 AM EDT RP Workstation: [id number]   DG Chest Port 1 View Result Date: 11/14/2023 EXAM: 1 VIEW(S) XRAY OF THE CHEST 11/14/2023 04:28:56 AM COMPARISON: PA and lateral radiographs of the chest dated 02/24/2013. CLINICAL HISTORY: fever, fall, ?ankle fracture. Pt arrives from home w/ c/o mechanical fall. Denies hitting head, denies thinners. C/o rt ankle pain, obvious deformity. Arrives in c-collar. FINDINGS: LUNGS AND PLEURA: Hyperinflation. Chronic coarsened interstitial markings without pulmonary edema. Right apical pleural/pulmonary scarring. No focal pulmonary opacity. No pleural effusion. No pneumothorax. HEART AND MEDIASTINUM: Surgical changes in mediastinum. Aortic calcification. Intact sternotomy wires. No acute abnormality of the cardiac and  mediastinal silhouettes. BONES AND SOFT TISSUES: No acute osseous abnormality. IMPRESSION: 1. No acute process. Electronically signed by: Evalene Coho MD 11/14/2023 05:20 AM EDT RP Workstation: [id number]    Assessment and Plan: 28F h/o CAD s/p CABG in 12/2012, HFpEF (EF 55-60% in 01/2021), COPD (on 3L [place]), HTN, HLD, DM2, and hypothyroidism p/w GLF c/b R ankle fracture.   R ankle fracture Fragility fracture -Orthopedic surgery consulted; apprec eval/recs -PT/OT consulted; apprec eval/recs -PO tylenol  TID, robaxin TID, gabapentin TID, and oxycodone  5-10mg  QID prn -F/u  25-OH vitamin D  and treat if low w/ ergochalciferol 50,000 IU weekly for 8-12 weeks; pt will need 25-OH vitamin D  re-testing as outpt to ensure repletion in 8-12 weeks; may consider ergochalciferol 2000 IU daily after testing in 8-12 weeks for maintenance therapy -Consider bisphosphonate or Prolia at d/c and OP PCP f/u for DEXA scan  CAD s/p CABG HFpEF -PTA ASA 81mg  daily to begin 10/19, atorvastatin  80mg  daily, bisoprolol  5mg  daily, and lisinopril  20mg  daily  COPD No acute exacerabation -PTA Anora Ellipta daily and Duonebs QID prn  HTN -PTA amlodipine  and lisinopril   DM2 -PTA glipizide 5mg  BID and SSI TID AC prn  Hypothyroid -PTA Synthroid 50 mcg daily   Advance Care Planning:   Code Status: Full Code   Consults: Orthopedic surgery  Family Communication: Daughter  Severity of Illness: The appropriate patient status for this patient is INPATIENT. Inpatient status is judged to be reasonable and necessary in order to provide the required intensity of service to ensure the patient's safety. The patient's presenting symptoms, physical exam findings, and initial radiographic and laboratory data in the context of their chronic comorbidities is felt to place them at high risk for further clinical deterioration. Furthermore, it is not anticipated that the patient will be medically stable for discharge from the hospital  within 2 midnights of admission.   * I certify that at the point of admission it is my clinical judgment that the patient will require inpatient hospital care spanning beyond 2 midnights from the point of admission due to high intensity of service, high risk for further deterioration and high frequency of surveillance required.*   ------- I spent 61 minutes reviewing previous notes, at the bedside counseling/discussing the treatment plan, and performing clinical documentation.  Author: Marsha Ada, MD 11/14/2023 12:34 PM  For on call review [email or web address].

## 2023-11-14 NOTE — ED Notes (Signed)
 Called to give report to [facility] ED. Spoke with Charge nurse who said I needed to talk to the A/C, then proceeded to give number. Called A/C twice with no answer, called Charge back to inform them that [facility] is on the way.

## 2023-11-15 ENCOUNTER — Inpatient Hospital Stay (HOSPITAL_COMMUNITY)

## 2023-11-15 ENCOUNTER — Encounter (HOSPITAL_COMMUNITY): Payer: Self-pay | Admitting: Orthopedic Surgery

## 2023-11-15 DIAGNOSIS — W19XXXA Unspecified fall, initial encounter: Secondary | ICD-10-CM | POA: Diagnosis not present

## 2023-11-15 DIAGNOSIS — S82891A Other fracture of right lower leg, initial encounter for closed fracture: Secondary | ICD-10-CM | POA: Diagnosis not present

## 2023-11-15 LAB — BASIC METABOLIC PANEL WITH GFR
Anion gap: 8 (ref 5–15)
BUN: 18 mg/dL (ref 8–23)
CO2: 29 mmol/L (ref 22–32)
Calcium: 8.5 mg/dL — ABNORMAL LOW (ref 8.9–10.3)
Chloride: 99 mmol/L (ref 98–111)
Creatinine, Ser: 1.22 mg/dL — ABNORMAL HIGH (ref 0.44–1.00)
GFR, Estimated: 47 mL/min — ABNORMAL LOW (ref >60)
Glucose, Bld: 88 mg/dL (ref 70–99)
Potassium: 5 mmol/L (ref 3.5–5.1)
Sodium: 136 mmol/L (ref 135–145)

## 2023-11-15 LAB — URINE CULTURE

## 2023-11-15 LAB — CBC
HCT: 29.5 % — ABNORMAL LOW (ref 36.0–46.0)
Hemoglobin: 8.8 g/dL — ABNORMAL LOW (ref 12.0–15.0)
MCH: 27.6 pg (ref 26.0–34.0)
MCHC: 29.8 g/dL — ABNORMAL LOW (ref 30.0–36.0)
MCV: 92.5 fL (ref 80.0–100.0)
Platelets: 265 K/uL (ref 150–400)
RBC: 3.19 MIL/uL — ABNORMAL LOW (ref 3.87–5.11)
RDW: 15.7 % — ABNORMAL HIGH (ref 11.5–15.5)
WBC: 9.5 K/uL (ref 4.0–10.5)
nRBC: 0 % (ref 0.0–0.2)

## 2023-11-15 LAB — GLUCOSE, CAPILLARY
Glucose-Capillary: 130 mg/dL — ABNORMAL HIGH (ref 70–99)
Glucose-Capillary: 85 mg/dL (ref 70–99)
Glucose-Capillary: 115 mg/dL — ABNORMAL HIGH (ref 70–99)
Glucose-Capillary: 143 mg/dL — ABNORMAL HIGH (ref 70–99)

## 2023-11-15 MED ORDER — VITAMIN D (ERGOCALCIFEROL) 1.25 MG (50000 UNIT) PO CAPS
50000.0000 [IU] | ORAL_CAPSULE | ORAL | Status: DC
Start: 2023-11-15 — End: 2023-11-26
  Administered 2023-11-22: 50000 [IU] via ORAL
  Filled 2023-11-15 (×2): qty 1

## 2023-11-15 MED ORDER — MELATONIN 5 MG PO TABS
5.0000 mg | ORAL_TABLET | Freq: Every evening | ORAL | Status: DC | PRN
  Administered 2023-11-16 – 2023-11-25 (×2): 5 mg via ORAL
  Filled 2023-11-15 (×2): qty 1

## 2023-11-15 MED ORDER — HYDROCODONE-ACETAMINOPHEN 5-325 MG PO TABS
1.0000 | ORAL_TABLET | Freq: Four times a day (QID) | ORAL | Status: DC | PRN
Start: 2023-11-15 — End: 2023-11-17
  Administered 2023-11-15 – 2023-11-17 (×5): 1 via ORAL
  Filled 2023-11-15 (×6): qty 1

## 2023-11-15 MED ORDER — IPRATROPIUM-ALBUTEROL 0.5-2.5 (3) MG/3ML IN SOLN
3.0000 mL | Freq: Four times a day (QID) | RESPIRATORY_TRACT | Status: DC
  Administered 2023-11-15 – 2023-11-18 (×15): 3 mL via RESPIRATORY_TRACT
  Filled 2023-11-15 (×15): qty 3

## 2023-11-15 MED ORDER — ARFORMOTEROL TARTRATE 15 MCG/2ML IN NEBU
15.0000 ug | INHALATION_SOLUTION | Freq: Two times a day (BID) | RESPIRATORY_TRACT | Status: DC
  Administered 2023-11-16 – 2023-11-26 (×18): 15 ug via RESPIRATORY_TRACT
  Filled 2023-11-15 (×19): qty 2

## 2023-11-15 NOTE — Evaluation (Signed)
 Physical Therapy Evaluation Patient Details Name: [name] MRN: [id number] DOB: Sep 18, 1950 Today's Date: 11/15/2023  History of Present Illness  [name] is a 73 y.o. female presents with a ground level fallresulting in  R ankle fracture; now s/p ORIF, NWB; with medical history significant of COPD (on 3L [place]), HTN, HLD, DM2, and hypothyroidism  Clinical Impression  Pt admitted with above diagnosis. Lives at home with daughter, and son is local as well (per chart review); Pt's prior level of function is unknown at this time, will need more information re: home setup and specific on available assist; Presents to PT with limited mobility and activity tolerance due to pain in RLE; Pt initiated with effort to sit up, but once she had to move her RLE, the pain was too much, and she had to lay back down;  Nting she was confused this am, and Dr. Juvenal had made adjustments to her pain medication regimen; Will follow, and as more information is available and as pt progresses, will amend PT goals and DC plans as appropriate; Pt currently with functional limitations due to the deficits listed below (see PT Problem List). Pt will benefit from skilled PT to increase their independence and safety with mobility to allow discharge to the venue listed below.           If plan is discharge home, recommend the following: A lot of help with walking and/or transfers;A lot of help with bathing/dressing/bathroom;Assistance with cooking/housework;Assist for transportation;Help with stairs or ramp for entrance;Supervision due to cognitive status   Can travel by private vehicle   No    Equipment Recommendations Rolling walker (2 wheels);BSC/3in1;Wheelchair (measurements PT);Wheelchair cushion (measurements PT) (will consider drop-arm BAC)  Recommendations for Other Services  OT consult (as ordered)    Functional Status Assessment Patient has had a recent decline in their functional status and/or  demonstrates limited ability to make significant improvements in function in a reasonable and predictable amount of time     Precautions / Restrictions Precautions Precautions: Fall Restrictions RLE Weight Bearing Per Provider Order: Non weight bearing      Mobility  Bed Mobility Overal bed mobility: Needs Assistance Bed Mobility: Supine to Sit     Supine to sit: Mod assist     General bed mobility comments: Initaited sitting up to EOB, with LLE movement off of teh EOB, but when it was time to move RLE, pt was in too much pain to continue, so repositioned in the bed    Transfers                        Ambulation/Gait                  Stairs            Wheelchair Mobility     Tilt Bed    Modified Rankin (Stroke Patients Only)       Balance                                             Pertinent Vitals/Pain Pain Assessment Pain Assessment: Faces Faces Pain Scale: Hurts whole lot Pain Location: Significant grimace with movement of RLE Pain Descriptors / Indicators: Grimacing, Guarding Pain Intervention(s): Monitored during session, Limited activity within patient's tolerance    Home Living Family/patient expects to be discharged to::  Private residence Living Arrangements: Children (lives with daughter) Available Help at Discharge: Family               Additional Comments: Pt unable to give specifics re: home setup and available assist    Prior Function Prior Level of Function : Patient poor historian/Family not available                     Extremity/Trunk Assessment   Upper Extremity Assessment Upper Extremity Assessment: Defer to OT evaluation (able to manipulate bedclothes)    Lower Extremity Assessment Lower Extremity Assessment: Difficult to assess due to impaired cognition;RLE deficits/detail;LLE deficits/detail RLE Deficits / Details: sensation intact to light touch; Mod to Max physical  assist to move RLE, painful RLE: Unable to fully assess due to pain LLE Deficits / Details: able to flex hip and knee to LLE hooklying on the bed; Able to move LLE off of the bed in initiation of getting up, and tehn lift LLE back up into bed       Communication   Communication Communication: Impaired Factors Affecting Communication: Other (comment) (incr time to answer questions)    Cognition Arousal: Alert (Asleep at first, but aroused with PROM R and LLEs) Behavior During Therapy: WFL for tasks assessed/performed, Impulsive, Anxious   PT - Cognitive impairments: No family/caregiver present to determine baseline                       PT - Cognition Comments: Awoke to movement of LEs; would occasionally drift back to eyes closed at beginning of session; unclear answers re: home setup and assist Following commands: Impaired Following commands impaired: Follows one step commands inconsistently, Follows one step commands with increased time     Cueing Cueing Techniques: Verbal cues, Tactile cues, Gestural cues     General Comments General comments (skin integrity, edema, etc.): Session conducted on 3 L supplemental O2    Exercises     Assessment/Plan    PT Assessment Patient needs continued PT services  PT Problem List Decreased strength;Decreased range of motion;Decreased activity tolerance;Decreased balance;Decreased mobility;Decreased coordination;Decreased cognition;Decreased knowledge of use of DME;Decreased safety awareness;Decreased knowledge of precautions;Cardiopulmonary status limiting activity;Pain       PT Treatment Interventions DME instruction;Gait training;Stair training;Functional mobility training;Therapeutic activities;Therapeutic exercise;Balance training;Neuromuscular re-education;Cognitive remediation;Patient/family education;Wheelchair mobility training;Manual techniques    PT Goals (Current goals can be found in the Care Plan section)  Acute  Rehab PT Goals Patient Stated Goal: did not state PT Goal Formulation: Patient unable to participate in goal setting Time For Goal Achievement: 11/29/23 Potential to Achieve Goals: Fair    Frequency Min 2X/week     Co-evaluation               AM-PAC PT 6 Clicks Mobility  Outcome Measure Help needed turning from your back to your side while in a flat bed without using bedrails?: A Lot Help needed moving from lying on your back to sitting on the side of a flat bed without using bedrails?: A Lot Help needed moving to and from a bed to a chair (including a wheelchair)?: Total Help needed standing up from a chair using your arms (e.g., wheelchair or bedside chair)?: Total Help needed to walk in hospital room?: Total Help needed climbing 3-5 steps with a railing? : Total 6 Click Score: 8    End of Session Equipment Utilized During Treatment: Oxygen  Activity Tolerance: Patient limited by pain Patient left: in bed;with call  bell/phone within reach;with bed alarm set   PT Visit Diagnosis: Pain;Other abnormalities of gait and mobility (R26.89);History of falling (Z91.81);Muscle weakness (generalized) (M62.81);Difficulty in walking, not elsewhere classified (R26.2) Pain - Right/Left: Right Pain - part of body: Leg    Time: 1257-1311 PT Time Calculation (min) (ACUTE ONLY): 14 min   Charges:   PT Evaluation $PT Eval Moderate Complexity: 1 Mod   PT General Charges $$ ACUTE PT VISIT: 1 Visit         Silvano Currier, PT  Acute Rehabilitation Services Office [phone] Secure Chat welcomed   Silvano VEAR Currier 11/15/2023, 6:22 PM

## 2023-11-15 NOTE — Progress Notes (Signed)
 Attempted to have IV team to put IV for the patient for pain medication. Patient is refusing and saying she has the right to refuse. Explained to her about the medication that needs to be given for pain through IV for she is asking for pain medicine, but would reply that she is being over medicated.

## 2023-11-15 NOTE — Progress Notes (Signed)
 OT Cancellation Note  Patient Details Name: [name] MRN: [id number] DOB: 11-Apr-1950   Cancelled Treatment:    Reason Eval/Treat Not Completed: Patient declined, no reason specified (Patient reporting  its too doon to try and move  OT will reattempt as able and continue to follow)  [name] 11/15/2023, 3:53 PM

## 2023-11-15 NOTE — Plan of Care (Signed)
   Problem: Skin Integrity: Goal: Risk for impaired skin integrity will decrease Outcome: Progressing

## 2023-11-15 NOTE — Progress Notes (Signed)
 Patient stated that she is having left facial numbers and stated that it was going for a year already. Informed Abigail Chavez, NP about the patient statement.

## 2023-11-15 NOTE — Plan of Care (Signed)
  Problem: Nutrition: Goal: Adequate nutrition will be maintained Outcome: Progressing   Problem: Coping: Goal: Level of anxiety will decrease Outcome: Progressing   Problem: Pain Managment: Goal: General experience of comfort will improve and/or be controlled Outcome: Progressing   Problem: Safety: Goal: Ability to remain free from injury will improve Outcome: Progressing

## 2023-11-15 NOTE — Progress Notes (Signed)
 Held amlodipine  and lisinopril  due to BP per Dr. Harlene Bowl.   11/15/23 1008  Vitals  Temp (!) 97.4 F (36.3 C)  Temp Source Oral  BP (!) 127/42  MAP (mmHg) 66  BP Location Right Arm  BP Method Automatic  Patient Position (if appropriate) Sitting  Pulse Rate 62  Pulse Rate Source Monitor  Resp 16  MEWS COLOR  MEWS Score Color Green  Oxygen  Therapy  SpO2 (!) 88 %  O2 Device Nasal Cannula  O2 Flow Rate (L/min) 3 L/min  MEWS Score  MEWS Temp 0  MEWS Systolic 0  MEWS Pulse 0  MEWS RR 0  MEWS LOC 0  MEWS Score 0

## 2023-11-15 NOTE — Progress Notes (Signed)
 PROGRESS NOTE    [name]  FMW:[id number] DOB: 1950/05/03 DOA: 11/14/2023 PCP: Rosamond Leta NOVAK, MD    Brief Narrative:  36F h/o CAD s/p CABG in 12/2012, HFpEF (EF 55-60% in 01/2021), COPD (on 3L [place]), HTN, HLD, DM2, and hypothyroidism p/w GLF c/b R ankle fracture.  Status post ORIF 10/18.  Some confusion on the a.m. of 10/19   Assessment and Plan: R ankle fracture Fragility fracture -Orthopedic surgery -PT/OT consult -PO tylenol  TID, robaxin TID, gabapentin TID-have adjusted oxy to Norco per family request low 25-OH vitamin D  and treat if low w/ ergochalciferol 50,000 IU weekly for 8-12 weeks; pt will need 25-OH vitamin D  re-testing as outpt to ensure repletion in 8-12 weeks; may consider ergochalciferol 2000 IU daily after testing in 8-12 weeks for maintenance therapy    CAD s/p CABG HFpEF -PTA ASA 81mg  daily to begin 10/19, atorvastatin  80mg  daily, bisoprolol  5mg  daily, and lisinopril  20mg  daily   COPD No acute exacerabation -PTA Anora Ellipta daily and Duonebs QID prn   HTN -PTA amlodipine  and lisinopril    DM2 - SSI TID AC prn   Hypothyroid -PTA Synthroid 50 mcg daily   DVT prophylaxis: enoxaparin  (LOVENOX ) injection 40 mg Start: 11/14/23 0945    Code Status: Full Code Family Communication:   Disposition Plan:  Level of care: Med-Surg Status is: Inpatient     Consultants:  Orthopedics  Subjective: Confused and paranoid this a.m.  Objective: Vitals:   11/14/23 2234 11/15/23 0347 11/15/23 1008 11/15/23 1037  BP: (!) 127/48 (!) 158/60 (!) 127/42   Pulse: 65 70 62   Resp: 17 16 16    Temp: 98.1 F (36.7 C) 98 F (36.7 C) (!) 97.4 F (36.3 C)   TempSrc: Oral Oral Oral   SpO2: 94% 93% (!) 88% 96%  Weight:      Height:        Intake/Output Summary (Last 24 hours) at 11/15/2023 1313 Last data filed at 11/15/2023 0100 Gross per 24 hour  Intake 160 ml  Output 500 ml  Net -340 ml   Filed Weights   11/14/23 1015  Weight: 60.8 kg     Examination:   General: Appearance:    Well developed, well nourished female in no acute distress     Lungs:     Clear to auscultation bilaterally, respirations unlabored  Heart:    Normal heart rate..    MS:   All extremities are intact.    Neurologic:   Awake, alert-confused and irritable       Data Reviewed: I have personally reviewed following labs and imaging studies  CBC: Recent Labs  Lab 11/14/23 0413 11/14/23 1532 11/15/23 1103  WBC 15.8* 12.5* 9.5  NEUTROABS 11.1*  --   --   HGB 10.8* 10.7* 8.8*  HCT 36.2 36.1 29.5*  MCV 93.8 93.8 92.5  PLT 330 204 265   Basic Metabolic Panel: Recent Labs  Lab 11/14/23 0413 11/14/23 1532 11/15/23 1103  NA 138  --  136  K 5.3*  --  5.0  CL 101  --  99  CO2 34*  --  29  GLUCOSE 96  --  88  BUN 12  --  18  CREATININE 1.06* 1.24* 1.22*  CALCIUM  9.2  --  8.5*   GFR: Estimated Creatinine Clearance: 35.5 mL/min (A) (by C-G formula based on SCr of 1.22 mg/dL (H)). Liver Function Tests: Recent Labs  Lab 11/14/23 0413  AST 16  ALT 6  ALKPHOS 85  BILITOT <0.2  PROT 6.4*  ALBUMIN  3.5   No results for input(s): LIPASE, AMYLASE in the last 168 hours. No results for input(s): AMMONIA in the last 168 hours. Coagulation Profile: No results for input(s): INR, PROTIME in the last 168 hours. Cardiac Enzymes: No results for input(s): CKTOTAL, CKMB, CKMBINDEX, TROPONINI in the last 168 hours. BNP (last 3 results) No results for input(s): PROBNP in the last 8760 hours. HbA1C: Recent Labs    11/14/23 1831  HGBA1C 5.6   CBG: Recent Labs  Lab 11/14/23 1030 11/14/23 1418 11/14/23 1938 11/15/23 0742 11/15/23 1203  GLUCAP 111* 132* 140* 130* 85   Lipid Profile: No results for input(s): CHOL, HDL, LDLCALC, TRIG, CHOLHDL, LDLDIRECT in the last 72 hours. Thyroid  Function Tests: No results for input(s): TSH, T4TOTAL, FREET4, T3FREE, THYROIDAB in the last 72 hours. Anemia  Panel: No results for input(s): VITAMINB12, FOLATE, FERRITIN, TIBC, IRON, RETICCTPCT in the last 72 hours. Sepsis Labs: No results for input(s): PROCALCITON, LATICACIDVEN in the last 168 hours.  Recent Results (from the past 240 hours)  Resp panel by RT-PCR (RSV, Flu A&B, Covid) Anterior Nasal Swab     Status: None   Collection Time: 11/14/23  4:14 AM   Specimen: Anterior Nasal Swab  Result Value Ref Range Status   SARS Coronavirus 2 by RT PCR NEGATIVE NEGATIVE Final    Comment: (NOTE) SARS-CoV-2 target nucleic acids are NOT DETECTED.  The SARS-CoV-2 RNA is generally detectable in upper respiratory specimens during the acute phase of infection. The lowest concentration of SARS-CoV-2 viral copies this assay can detect is 138 copies/mL. A negative result does not preclude SARS-Cov-2 infection and should not be used as the sole basis for treatment or other patient management decisions. A negative result may occur with  improper specimen collection/handling, submission of specimen other than nasopharyngeal swab, presence of viral mutation(s) within the areas targeted by this assay, and inadequate number of viral copies(<138 copies/mL). A negative result must be combined with clinical observations, patient history, and epidemiological information. The expected result is Negative.  Fact Sheet for Patients:  BloggerCourse.com  Fact Sheet for Healthcare Providers:  SeriousBroker.it  This test is no t yet approved or cleared by the [place]  FDA and  has been authorized for detection and/or diagnosis of SARS-CoV-2 by FDA under an Emergency Use Authorization (EUA). This EUA will remain  in effect (meaning this test can be used) for the duration of the COVID-19 declaration under Section 564(b)(1) of the Act, 21 U.S.C.section 360bbb-3(b)(1), unless the authorization is terminated  or revoked sooner.       Influenza A  by PCR NEGATIVE NEGATIVE Final   Influenza B by PCR NEGATIVE NEGATIVE Final    Comment: (NOTE) The Xpert Xpress SARS-CoV-2/FLU/RSV plus assay is intended as an aid in the diagnosis of influenza from Nasopharyngeal swab specimens and should not be used as a sole basis for treatment. Nasal washings and aspirates are unacceptable for Xpert Xpress SARS-CoV-2/FLU/RSV testing.  Fact Sheet for Patients: BloggerCourse.com  Fact Sheet for Healthcare Providers: SeriousBroker.it  This test is not yet approved or cleared by the [place]  FDA and has been authorized for detection and/or diagnosis of SARS-CoV-2 by FDA under an Emergency Use Authorization (EUA). This EUA will remain in effect (meaning this test can be used) for the duration of the COVID-19 declaration under Section 564(b)(1) of the Act, 21 U.S.C. section 360bbb-3(b)(1), unless the authorization is terminated or revoked.     Resp Syncytial Virus by PCR  NEGATIVE NEGATIVE Final    Comment: (NOTE) Fact Sheet for Patients: BloggerCourse.com  Fact Sheet for Healthcare Providers: SeriousBroker.it  This test is not yet approved or cleared by the [place]  FDA and has been authorized for detection and/or diagnosis of SARS-CoV-2 by FDA under an Emergency Use Authorization (EUA). This EUA will remain in effect (meaning this test can be used) for the duration of the COVID-19 declaration under Section 564(b)(1) of the Act, 21 U.S.C. section 360bbb-3(b)(1), unless the authorization is terminated or revoked.  Performed at [facility], [address], [place], [place] [address]   Culture, blood (routine x 2)     Status: None (Preliminary result)   Collection Time: 11/14/23  6:28 AM   Specimen: BLOOD RIGHT ARM  Result Value Ref Range Status   Specimen Description   Final    BLOOD RIGHT ARM BOTTLES DRAWN AEROBIC AND ANAEROBIC    Special Requests   Final    Blood Culture results may not be optimal due to an inadequate volume of blood received in culture bottles   Culture   Final    NO GROWTH 1 DAY Performed at [facility], [address], [place], [place] [address]    Report Status PENDING  Incomplete         Radiology Studies: DG Ankle Complete Right Result Date: 11/14/2023 CLINICAL DATA:  Fracture, mechanical fall. EXAM: RIGHT ANKLE - COMPLETE 3+ VIEW COMPARISON:  Radiographs earlier today FINDINGS: Improved alignment of distal fibular and medial malleolar fractures. The posterior malleolar fracture is obscured due to overlying cast/splint material. Improved mortise alignment. Overlying splint/cast material limits osseous and soft tissue fine detail. IMPRESSION: Improved alignment of distal fibular and medial malleolar fractures. Improved mortise alignment. Posterior malleolar fracture is not well demonstrated on provided views. Electronically Signed   By: Andrea Gasman M.D.   On: 11/14/2023 18:33   DG Ankle Complete Right Result Date: 11/14/2023 CLINICAL DATA:  Elective surgery, closed reduction of right ankle. EXAM: RIGHT ANKLE - COMPLETE 3+ VIEW COMPARISON:  Radiograph earlier today FINDINGS: Two fluoroscopic spot views of the right ankle submitted from the operating room. Improved ankle alignment. Overlying splint material limits osseous and soft tissue fine detail. The known distal fibular and tibial fractures are not well demonstrated on provided views. Fluoroscopy time 8.8 seconds. Dose 0.4 mGy. IMPRESSION: Intraoperative fluoroscopy during closed reduction of right ankle fracture. Electronically Signed   By: Andrea Gasman M.D.   On: 11/14/2023 16:49   DG C-Arm 1-60 Min-No Report Result Date: 11/14/2023 Fluoroscopy was utilized by the requesting physician.  No radiographic interpretation.   DG Ankle Right Port Result Date: 11/14/2023 EXAM: 1 or 2 VIEW(S) XRAY OF THE RIGHT ANKLE 11/14/2023 05:43:22  AM CLINICAL HISTORY: [id number] Ankle pain [id number]. Ankle pain/ post reduction Ankle pain/ post reduction COMPARISON: Right ankle series dated 11/14/2023. FINDINGS: BONES AND JOINTS: Trimalleolar fracture dislocation of the ankle joint is again demonstrated without significant change in the degree of displacement and angulation. SOFT TISSUES: A splint has been placed over the patient's ankle and foot. IMPRESSION: 1. Trimalleolar fracture dislocation of the right ankle joint without significant change in the degree of displacement and angulation compared to the prior study. Electronically signed by: Evalene Coho MD 11/14/2023 05:48 AM EDT RP Workstation: [id number]   DG Ankle Complete Right Result Date: 11/14/2023 EXAM: 3 OR MORE VIEW(S) XRAY OF THE RIGHT ANKLE 11/14/2023 04:29:15 AM CLINICAL HISTORY: fever, fall, ?ankle fracture. Pt arrives from home w/ c/o mechanical fall. Denies hitting  head, denies thinners. C/o rt ankle pain, obvious deformity. Arrives in c-collar. COMPARISON: None available. FINDINGS: BONES AND JOINTS: Trimalleolar fracture dislocation of the ankle, with mild displacement/deformity of the fracture fragments and mild lateral dislocation of the talus in relation to the tibial plafond. Transverse fracture of the medial malleolus, oblique fracture of the lateral malleolus and mildly displaced fracture of the posterior malleolus. Lateral and posterior subluxation of the talar dome in relation to the tibial plafond. SOFT TISSUES: Diffuse moderate circumferential soft tissue swelling about the ankle. IMPRESSION: 1. Trimalleolar fracture dislocation of the ankle with mild displacement/deformity of the fracture fragments and mild lateral dislocation of the talus in relation to the tibial plafond. 2. Lateral and posterior subluxation of the talar dome in relation to the tibial plafond. 3. Diffuse moderate circumferential soft tissue swelling about the ankle. Electronically signed by: Evalene Coho  MD 11/14/2023 05:21 AM EDT RP Workstation: [id number]   DG Chest Port 1 View Result Date: 11/14/2023 EXAM: 1 VIEW(S) XRAY OF THE CHEST 11/14/2023 04:28:56 AM COMPARISON: PA and lateral radiographs of the chest dated 02/24/2013. CLINICAL HISTORY: fever, fall, ?ankle fracture. Pt arrives from home w/ c/o mechanical fall. Denies hitting head, denies thinners. C/o rt ankle pain, obvious deformity. Arrives in c-collar. FINDINGS: LUNGS AND PLEURA: Hyperinflation. Chronic coarsened interstitial markings without pulmonary edema. Right apical pleural/pulmonary scarring. No focal pulmonary opacity. No pleural effusion. No pneumothorax. HEART AND MEDIASTINUM: Surgical changes in mediastinum. Aortic calcification. Intact sternotomy wires. No acute abnormality of the cardiac and mediastinal silhouettes. BONES AND SOFT TISSUES: No acute osseous abnormality. IMPRESSION: 1. No acute process. Electronically signed by: Evalene Coho MD 11/14/2023 05:20 AM EDT RP Workstation: [id number]        Scheduled Meds:  acetaminophen   1,000 mg Oral TID   amLODipine   5 mg Oral Daily   [START ON 11/16/2023] arformoterol  15 mcg Nebulization BID   aspirin  EC  81 mg Oral Daily   atorvastatin   80 mg Oral Daily   bisoprolol   5 mg Oral Daily   DULoxetine   30 mg Oral BID   enoxaparin  (LOVENOX ) injection  40 mg Subcutaneous Q24H   gabapentin  300 mg Oral TID   insulin  aspart  0-15 Units Subcutaneous TID WC   ipratropium-albuterol   3 mL Nebulization Q6H   levothyroxine  50 mcg Oral Daily   lisinopril   20 mg Oral Daily   methocarbamol  500 mg Oral TID   Continuous Infusions:   LOS: 1 day    Time spent: 45 minutes spent on chart review, discussion with nursing staff, consultants, updating family and interview/physical exam; more than 50% of that time was spent in counseling and/or coordination of care.    Harlene RAYMOND Bowl, DO [facility] Available via Epic secure chat 7am-7pm After these hours, please refer to  coverage provider listed on [email or web address] 11/15/2023, 1:13 PM

## 2023-11-15 NOTE — Progress Notes (Signed)
 Patient sleeping and resting comfortably, respirations 16/min. No signs or symptoms of pain.

## 2023-11-15 NOTE — Progress Notes (Signed)
 Message received from CT, patient for CT scan of right ankle. Patient would like to have pain medication before transport to CT. CT staff aware.

## 2023-11-16 DIAGNOSIS — W19XXXA Unspecified fall, initial encounter: Secondary | ICD-10-CM | POA: Diagnosis not present

## 2023-11-16 DIAGNOSIS — S82891A Other fracture of right lower leg, initial encounter for closed fracture: Secondary | ICD-10-CM | POA: Diagnosis not present

## 2023-11-16 LAB — BASIC METABOLIC PANEL WITH GFR
Anion gap: 11 (ref 5–15)
BUN: 16 mg/dL (ref 8–23)
CO2: 27 mmol/L (ref 22–32)
Calcium: 8.6 mg/dL — ABNORMAL LOW (ref 8.9–10.3)
Chloride: 101 mmol/L (ref 98–111)
Creatinine, Ser: 1.22 mg/dL — ABNORMAL HIGH (ref 0.44–1.00)
GFR, Estimated: 47 mL/min — ABNORMAL LOW (ref >60)
Glucose, Bld: 124 mg/dL — ABNORMAL HIGH (ref 70–99)
Potassium: 4.9 mmol/L (ref 3.5–5.1)
Sodium: 139 mmol/L (ref 135–145)

## 2023-11-16 LAB — GLUCOSE, CAPILLARY
Glucose-Capillary: 117 mg/dL — ABNORMAL HIGH (ref 70–99)
Glucose-Capillary: 199 mg/dL — ABNORMAL HIGH (ref 70–99)
Glucose-Capillary: 116 mg/dL — ABNORMAL HIGH (ref 70–99)
Glucose-Capillary: 315 mg/dL — ABNORMAL HIGH (ref 70–99)

## 2023-11-16 LAB — CBC
HCT: 28 % — ABNORMAL LOW (ref 36.0–46.0)
Hemoglobin: 8.7 g/dL — ABNORMAL LOW (ref 12.0–15.0)
MCH: 27.8 pg (ref 26.0–34.0)
MCHC: 31.1 g/dL (ref 30.0–36.0)
MCV: 89.5 fL (ref 80.0–100.0)
Platelets: 277 K/uL (ref 150–400)
RBC: 3.13 MIL/uL — ABNORMAL LOW (ref 3.87–5.11)
RDW: 15.8 % — ABNORMAL HIGH (ref 11.5–15.5)
WBC: 10.6 K/uL — ABNORMAL HIGH (ref 4.0–10.5)
nRBC: 0 % (ref 0.0–0.2)

## 2023-11-16 NOTE — TOC CAGE-AID Note (Signed)
 Transition of Care [facility]) - CAGE-AID Screening   Patient Details  Name: [name] MRN: [id number] Date of Birth: Aug 19, 1950  [facility]) CM/SW Contact:    Ariez Neilan E Magdiel Bartles, LCSW Phone Number: 11/16/2023, 10:37 AM   Clinical Narrative: Per chart review - not fully oriented.   CAGE-AID Screening: Substance Abuse Screening unable to be completed due to: : Patient unable to participate

## 2023-11-16 NOTE — Progress Notes (Signed)
 Orthopaedic Trauma Service Progress Note  Patient ID: [name] MRN: [id number] DOB/AGE: 73/04/1950 73 y.o.  Subjective:  R ankle sore  No other complaints   Pt would rather go home than SNF   Uses walker at baseline   ROS As above  Today's  total administered Morphine  Milligram Equivalents: 10 Yesterday's total administered Morphine  Milligram Equivalents: 37.5  Objective:   VITALS:   Vitals:   11/16/23 0206 11/16/23 0446 11/16/23 0735 11/16/23 0815  BP:  (!) 154/48  (!) 148/52  Pulse: 90 78  82  Resp: 16   15  Temp:  98.2 F (36.8 C)  98.4 F (36.9 C)  TempSrc:  Oral  Oral  SpO2: 91% 95% 91% 91%  Weight:      Height:        Estimated body mass index is 23 kg/m as calculated from the following:   Height as of this encounter: 5' 4 (1.626 m).   Weight as of this encounter: 60.8 kg.   Intake/Output      10/19 0701 10/20 0700 10/20 0701 10/21 0700   P.O.     I.V. (mL/kg)     Total Intake(mL/kg)     Urine (mL/kg/hr) 250 (0.2)    Total Output 250    Net -250         Urine Occurrence 1 x      LABS  Results for orders placed or performed during the hospital encounter of 11/14/23 (from the past 24 hours)  Glucose, capillary     Status: Abnormal   Collection Time: 11/15/23  4:58 PM  Result Value Ref Range   Glucose-Capillary 115 (H) 70 - 99 mg/dL  Glucose, capillary     Status: Abnormal   Collection Time: 11/15/23  9:47 PM  Result Value Ref Range   Glucose-Capillary 143 (H) 70 - 99 mg/dL  CBC     Status: Abnormal   Collection Time: 11/16/23  7:51 AM  Result Value Ref Range   WBC 10.6 (H) 4.0 - 10.5 K/uL   RBC 3.13 (L) 3.87 - 5.11 MIL/uL   Hemoglobin 8.7 (L) 12.0 - 15.0 g/dL   HCT 71.9 (L) 63.9 - 53.9 %   MCV 89.5 80.0 - 100.0 fL   MCH 27.8 26.0 - 34.0 pg   MCHC 31.1 30.0 - 36.0 g/dL   RDW 84.1 (H) 88.4 - 84.4 %   Platelets 277 150 - 400 K/uL   nRBC 0.0 0.0 - 0.2 %   Basic metabolic panel     Status: Abnormal   Collection Time: 11/16/23  7:51 AM  Result Value Ref Range   Sodium 139 135 - 145 mmol/L   Potassium 4.9 3.5 - 5.1 mmol/L   Chloride 101 98 - 111 mmol/L   CO2 27 22 - 32 mmol/L   Glucose, Bld 124 (H) 70 - 99 mg/dL   BUN 16 8 - 23 mg/dL   Creatinine, Ser 8.77 (H) 0.44 - 1.00 mg/dL   Calcium  8.6 (L) 8.9 - 10.3 mg/dL   GFR, Estimated 47 (L) >60 mL/min   Anion gap 11 5 - 15  Glucose, capillary     Status: Abnormal   Collection Time: 11/16/23  7:52 AM  Result Value Ref Range   Glucose-Capillary 117 (H) 70 - 99 mg/dL  PHYSICAL EXAM:   Gen: Sitting up in bedside chair, no acute distress, pleasant.  Much more awake and alert than when I first saw her in the emergency department Lungs: Unlabored  Ext:       Right lower extremity  SLS is fitting well, good mold  Ext warm  Still with moderate swelling to her right foot  Knee is unremarkable  Motor and sensory functions grossly intact   Assessment/Plan: 2 Days Post-Op   Principal Problem:   Closed right ankle fracture   Anti-infectives (From admission, onward)    Start     Dose/Rate Route Frequency Ordered Stop   11/14/23 1200  ceFAZolin (ANCEF) IVPB 2g/100 mL premix        2 g 200 mL/hr over 30 Minutes Intravenous On call to O.R. 11/14/23 1008 11/15/23 0559   11/14/23 0600  cefTRIAXone (ROCEPHIN) 1 g in sodium chloride  0.9 % 100 mL IVPB        1 g 200 mL/hr over 30 Minutes Intravenous  Once 11/14/23 0552 11/14/23 0724     .  POD/HD#: 48  73 year old female ground-level fall with right trimalleolar ankle fracture dislocation  -fall  - Right trimalleolar ankle fracture dislocation s/p  closed reduction in OR  Too swollen for definitive ORIF on the night of presentation  Will likely check her skin on Wednesday to see if she would be ready for OR on Thursday which is not likely but possible  Suspect definitive ORIF will take place next Thursday   Continue with  aggressive ice and elevation--->  toes above nose   Strict nonweightbearing right leg  - Pain management:  Multimodal, minimize narcotics  - DVT/PE prophylaxis:  Lovenox   - Metabolic Bone Disease:  Vitamin D  deficiency   Supplement  - Dispo:  Continue with current care  She could certainly discharge home or to SNF with adequate help and fix her ankle as an outpatient   Francis MICAEL Mt, PA-C [phone] (C) 11/16/2023, 12:20 PM  [facility] [address] [place] [address] [phone] (F)    After 5pm and on the weekends please log on to Amion, go to orthopaedics and the look under the [facility] Call for the provider(s) on call. You can also call our office at [phone] and then follow the prompts to be connected to the call team.  Patient ID: [name], female   DOB: 30-Apr-1950, 73 y.o.   MRN: [id number]

## 2023-11-16 NOTE — Plan of Care (Signed)

## 2023-11-16 NOTE — Progress Notes (Signed)
 Physical Therapy Treatment  Patient Details Name: [name] MRN: [id number] DOB: 03/13/1950 Today's Date: 11/16/2023   History of Present Illness [name] is a 73 y.o. female presents with a ground level fallresulting in  R ankle fracture; now s/p closed reduction in OR on 10/18, NWB; with medical history significant of COPD (on 3L [place]), HTN, HLD, DM2, and hypothyroidism    PT Comments  Pt progressing towards physical therapy goals. As pt had difficulty tolerating transfers with RLE in a dependent position on eval, opted for anterior>posterior transfer from bed to chair. RLE was cradled on a pillow and supported in an elevated position throughout transfer. Based on performance today, recommend continued skilled PT services <3 hours/day at d/c. Will continue to follow and progress as able per POC.     If plan is discharge home, recommend the following: A lot of help with walking and/or transfers;A lot of help with bathing/dressing/bathroom;Assistance with cooking/housework;Assist for transportation;Help with stairs or ramp for entrance;Supervision due to cognitive status   Can travel by private vehicle     No  Equipment Recommendations  Rolling walker (2 wheels) (will consider drop-arm BSC)    Recommendations for Other Services       Precautions / Restrictions Precautions Precautions: Fall Recall of Precautions/Restrictions: Intact Restrictions Weight Bearing Restrictions Per Provider Order: Yes RLE Weight Bearing Per Provider Order: Non weight bearing     Mobility  Bed Mobility Overal bed mobility: Needs Assistance Bed Mobility: Supine to Sit     Supine to sit: Min assist, HOB elevated     General bed mobility comments: Pt initiated long sitting with HOB elevated and use of BUE to bring trunk forward. Pt required Mod verbal cues for sequencing mobility and anterior trunk lean for A-P scoot.    Transfers Overall transfer level: Needs assistance Equipment  used: None Transfers: Bed to chair/wheelchair/BSC         Anterior-Posterior transfers: Mod assist, +2 physical assistance   General transfer comment: Mod A use of bed pad to faiclitate postrior scoot. Pt with iniital L lateral lean during transfer, able to correct with Min A. Pt provided with Min A management of RLE. Pt with improved posterior scoot abilities once able to utilize leverage from recliner arm rests.    Ambulation/Gait               General Gait Details: Unable to progress to gait training at this time.   Stairs             Wheelchair Mobility     Tilt Bed    Modified Rankin (Stroke Patients Only)       Balance Overall balance assessment: Needs assistance Sitting-balance support: Bilateral upper extremity supported, Feet supported (long sitting) Sitting balance-Leahy Scale: Fair Sitting balance - Comments: Maintained long sitting balance without back support on bed.       Standing balance comment: Not assessed                            Communication Communication Communication: Impaired Factors Affecting Communication: Reduced clarity of speech  Cognition Arousal: Alert Behavior During Therapy: WFL for tasks assessed/performed   PT - Cognitive impairments: No apparent impairments                         Following commands: Intact Following commands impaired: Follows multi-step commands with increased time    Cueing Cueing  Techniques: Verbal cues, Tactile cues, Visual cues  Exercises      General Comments General comments (skin integrity, edema, etc.): Pt on 3L O2.      Pertinent Vitals/Pain Pain Assessment Pain Assessment: Faces Faces Pain Scale: Hurts little more Pain Location: RLE Pain Descriptors / Indicators: Grimacing, Guarding Pain Intervention(s): Limited activity within patient's tolerance, Monitored during session, Repositioned    Home Living Family/patient expects to be discharged to::  Private residence Living Arrangements: Children Available Help at Discharge: Family;Available 24 hours/day Type of Home: House Home Access: Ramped entrance       Home Layout: One level Home Equipment: BSC/3in1;Shower seat;Hand held Stage manager (4 wheels);Cane - single point;Wheelchair - manual Additional Comments: Pt unable to give specifics re: home setup and available assist    Prior Function            PT Goals (current goals can now be found in the care plan section) Acute Rehab PT Goals Patient Stated Goal: did not state PT Goal Formulation: With patient Time For Goal Achievement: 11/29/23 Potential to Achieve Goals: Fair Progress towards PT goals: Progressing toward goals    Frequency    Min 2X/week      PT Plan      Co-evaluation PT/OT/SLP Co-Evaluation/Treatment: Yes Reason for Co-Treatment: Necessary to address cognition/behavior during functional activity;For patient/therapist safety;To address functional/ADL transfers PT goals addressed during session: Mobility/safety with mobility;Proper use of DME;Balance OT goals addressed during session: ADL's and self-care;Proper use of Adaptive equipment and DME      AM-PAC PT 6 Clicks Mobility   Outcome Measure  Help needed turning from your back to your side while in a flat bed without using bedrails?: A Little Help needed moving from lying on your back to sitting on the side of a flat bed without using bedrails?: A Lot Help needed moving to and from a bed to a chair (including a wheelchair)?: Total Help needed standing up from a chair using your arms (e.g., wheelchair or bedside chair)?: Total Help needed to walk in hospital room?: Total Help needed climbing 3-5 steps with a railing? : Total 6 Click Score: 9    End of Session Equipment Utilized During Treatment: Oxygen  Activity Tolerance: Patient limited by pain Patient left: in chair;with call bell/phone within reach;with chair alarm set Nurse  Communication: Mobility status PT Visit Diagnosis: Pain;Other abnormalities of gait and mobility (R26.89);History of falling (Z91.81);Muscle weakness (generalized) (M62.81);Difficulty in walking, not elsewhere classified (R26.2) Pain - Right/Left: Right Pain - part of body: Leg     Time: 8991-8964 PT Time Calculation (min) (ACUTE ONLY): 27 min  Charges:    $Gait Training: 8-22 mins PT General Charges $$ ACUTE PT VISIT: 1 Visit                     Leita Sable, PT, DPT Acute Rehabilitation Services Secure Chat Preferred Office: [phone]    Leita JONETTA Sable 11/16/2023, 1:23 PM

## 2023-11-16 NOTE — TOC Progression Note (Addendum)
 Transition of Care [facility]) - Progression Note    Patient Details  Name: [name] MRN: [id number] Date of Birth: February 24, 1950  Transition of Care  Per SW son [name] prefers patient to go home at discharge with HHPT instead of SNF.   NCM secure chatted MD , PT and OT.   PT/OT recommendations continue to be SNF. DME per PT , patient has a wheelchair already but will need rolling walker and drop arm bedside commode. PT also recommending 24/7 assistance at home .  NCM called  sister call cannot be completed as dialed , called [name] no answer and voicemail is full. No number for daughter  Continue to try to reach family   Per MD Not ready for d/c yet-- ortho to check skin on Wednesday and determine date of surgery   Sent [name] a text asking him to call NCM back . Responded via text ok. Await call back No visitors at bedside .   [address] [name] called back .  Confirmed they want home health and not rehab at a SNF.   [name] aware HHPT usually only visits twice a week for a hour at a time.   Patient will have 24/7 assistance at home. [name]'s sister's bed is next to [name]'s bed.   Patient has rolling walker, wheelchair, Rollator , and drop arm bedside commode at home already . [name] aware PT recommending rolling walker and not Rollator due to maintaining balance while NWB.   Anticipated discharge date is Wednesday 11/18/23 .   [name] has no preference in home health agency. Amy with [facility] accepted referral    [address] [name] called back, they do NOT have a rolling walker. NCM entered order for MD to sign and call [name] with [employer]  [facility]) CM/SW Contact  Stephane Powell Jansky, RN Phone Number: 11/16/2023, 12:52 PM  Clinical Narrative:       Expected Discharge Plan: Home w Home Health Services Barriers to Discharge: Continued Medical Work up               Expected Discharge Plan and Services       Living arrangements for the past 2 months: Single Family Home                                        Social Drivers of Health (SDOH) Interventions SDOH Screenings   Food Insecurity: Patient Unable To Answer (11/15/2023)  Housing: Patient Declined (11/15/2023)  Transportation Needs: Patient Declined (11/15/2023)  Utilities: Patient Declined (11/15/2023)  Social Connections: Patient Declined (11/15/2023)  Tobacco Use: High Risk (11/14/2023)    Readmission Risk Interventions     No data to display

## 2023-11-16 NOTE — Evaluation (Signed)
 Occupational Therapy Evaluation Patient Details Name: [name] MRN: [id number] DOB: 11-19-1950 Today's Date: 11/16/2023   History of Present Illness   [name] is a 73 y.o. female presents with a ground level fallresulting in  R ankle fracture; now s/p ORIF, NWB; with medical history significant of COPD (on 3L [place]), HTN, HLD, DM2, and hypothyroidism     Clinical Impressions PTA Pt required Min A from family for BADLs and occasional hands-on assistance with functional mobility. Utilizes wheelchair out of the home and only transfers to/from bed and BSC. Pt currently requires Mod A +2 for functional transfers and up to Max A for ADL tasks; currently requiring total A for toileting. Pt is primarily limited by NWB precautions, RLE pain, decreased activity tolerance, and generalized weakness. OT to continue to follow Pt acutely to facilitate progress towards goals. Patient will benefit from continued inpatient follow up therapy, <3 hours/day.      If plan is discharge home, recommend the following:   Two people to help with walking and/or transfers;A lot of help with bathing/dressing/bathroom;Assistance with cooking/housework;Assist for transportation;Help with stairs or ramp for entrance     Functional Status Assessment   Patient has had a recent decline in their functional status and demonstrates the ability to make significant improvements in function in a reasonable and predictable amount of time.     Equipment Recommendations   BSC/3in1;Other (comment) (drop arm BSC; Rolling walker)     Recommendations for Other Services         Precautions/Restrictions   Precautions Precautions: Fall Recall of Precautions/Restrictions: Intact Restrictions Weight Bearing Restrictions Per Provider Order: Yes RLE Weight Bearing Per Provider Order: Non weight bearing     Mobility Bed Mobility Overal bed mobility: Needs Assistance Bed Mobility: Supine to Sit      Supine to sit: Min assist, HOB elevated     General bed mobility comments: Pt initiated long sitting with HOB elevated and use of BUE to bring trunk forward. Pt required Mod verbal cues for sequencing mobility and anterior trunk lean for A-P scoot.    Transfers Overall transfer level: Needs assistance Equipment used: None Transfers: Bed to chair/wheelchair/BSC         Anterior-Posterior transfers: Mod assist, +2 physical assistance   General transfer comment: Mod A use of bed pad to faiclitate posterior scoot. Pt with inital L lateral lean during transfer, able to correct with Min A. Pt provided with Min A management of RLE. Pt with improved posterior scoot abilities once able to utilize leverage from recliner arm rests.      Balance Overall balance assessment: Needs assistance Sitting-balance support: Bilateral upper extremity supported, Feet supported (long sitting) Sitting balance-Leahy Scale: Fair Sitting balance - Comments: Maintained long sitting balance without back support on bed.       Standing balance comment: Not assessed                           ADL either performed or assessed with clinical judgement   ADL Overall ADL's : Needs assistance/impaired Eating/Feeding: Set up   Grooming: Set up;Sitting   Upper Body Bathing: Minimal assistance;Sitting   Lower Body Bathing: Maximal assistance;Sitting/lateral leans   Upper Body Dressing : Set up;Sitting   Lower Body Dressing: Maximal assistance;Sitting/lateral leans   Toilet Transfer: Moderate assistance;+2 for physical assistance;Anterior/posterior;BSC/3in1   Toileting- Architect and Hygiene: Total assistance Toileting - Clothing Manipulation Details (indicate cue type and reason): Purewick  General ADL Comments: Pt limited in ADL engagement d/t RLE pain     Vision   Vision Assessment?: No apparent visual deficits     Perception         Praxis         Pertinent  Vitals/Pain Pain Assessment Pain Assessment: PAINAD Breathing: occasional labored breathing, short period of hyperventilation Negative Vocalization: occasional moan/groan, low speech, negative/disapproving quality Facial Expression: smiling or inexpressive Body Language: relaxed Consolability: no need to console PAINAD Score: 2 Pain Location: RLE Pain Descriptors / Indicators: Grimacing, Guarding Pain Intervention(s): Limited activity within patient's tolerance, Monitored during session     Extremity/Trunk Assessment Upper Extremity Assessment Upper Extremity Assessment: Overall WFL for tasks assessed   Lower Extremity Assessment Lower Extremity Assessment: Defer to PT evaluation RLE Deficits / Details: sensation intact to light touch; Mod to Max physical assist to move RLE, painful RLE: Unable to fully assess due to pain LLE Deficits / Details: able to flex hip and knee to LLE hooklying on the bed; Able to move LLE off of the bed in initiation of getting up, and tehn lift LLE back up into bed       Communication Communication Communication: Impaired Factors Affecting Communication: Reduced clarity of speech   Cognition Arousal: Alert Behavior During Therapy: WFL for tasks assessed/performed Cognition: No apparent impairments                               Following commands: Intact Following commands impaired: Follows multi-step commands with increased time     Cueing  General Comments   Cueing Techniques: Verbal cues;Tactile cues;Visual cues  Pt on 3L O2.   Exercises     Shoulder Instructions      Home Living Family/patient expects to be discharged to:: Private residence Living Arrangements: Children Available Help at Discharge: Family;Available 24 hours/day Type of Home: House Home Access: Ramped entrance     Home Layout: One level     Bathroom Shower/Tub: Sponge bathes at baseline   Bathroom Toilet: Standard     Home Equipment:  BSC/3in1;Shower seat;Hand held Stage manager (4 wheels);Cane - single point;Wheelchair - manual   Additional Comments: Pt unable to give specifics re: home setup and available assist      Prior Functioning/Environment Prior Level of Function : Needs assist       Physical Assist : Mobility (physical);ADLs (physical) Mobility (physical): Transfers ADLs (physical): Bathing;Dressing;IADLs Mobility Comments: wheelchair when she goes out. transfers only in the house to/from [facility]. Spends most of her time in the bed. Eats meals in the bedroom as well. Able to walk to the bathroom for showers with hands on assist. ADLs Comments: Once a week son assists her to get in the tub/shower. Daughter assists to wash her back, she is able to do the rest. Typically uses the [facility] and is able to transfer independently.    OT Problem List: Decreased strength;Decreased activity tolerance;Impaired balance (sitting and/or standing);Decreased knowledge of use of DME or AE;Decreased knowledge of precautions;Decreased safety awareness;Pain   OT Treatment/Interventions: Self-care/ADL training;Therapeutic exercise;Energy conservation;DME and/or AE instruction;Therapeutic activities;Patient/family education;Balance training      OT Goals(Current goals can be found in the care plan section)   Acute Rehab OT Goals Patient Stated Goal: None stated this session OT Goal Formulation: With patient Time For Goal Achievement: 11/30/23 Potential to Achieve Goals: Good ADL Goals Pt Will Perform Lower Body Bathing: with min assist;with adaptive equipment;sitting/lateral leans Pt  Will Perform Lower Body Dressing: with min assist;with adaptive equipment;sitting/lateral leans Pt Will Transfer to Toilet: with contact guard assist;bedside commode;anterior/posterior transfer Pt Will Perform Toileting - Clothing Manipulation and hygiene: with mod assist;sitting/lateral leans   OT Frequency:  Min 2X/week    Co-evaluation  PT/OT/SLP Co-Evaluation/Treatment: Yes Reason for Co-Treatment: Necessary to address cognition/behavior during functional activity;For patient/therapist safety;To address functional/ADL transfers PT goals addressed during session: Mobility/safety with mobility;Proper use of DME;Balance OT goals addressed during session: ADL's and self-care;Proper use of Adaptive equipment and DME      AM-PAC OT 6 Clicks Daily Activity     Outcome Measure Help from another person eating meals?: A Little Help from another person taking care of personal grooming?: A Little Help from another person toileting, which includes using toliet, bedpan, or urinal?: Total Help from another person bathing (including washing, rinsing, drying)?: A Lot Help from another person to put on and taking off regular upper body clothing?: A Little Help from another person to put on and taking off regular lower body clothing?: A Lot 6 Click Score: 14   End of Session Equipment Utilized During Treatment: Oxygen  Nurse Communication: Mobility status  Activity Tolerance: Patient tolerated treatment well Patient left: in chair;with call bell/phone within reach;with chair alarm set  OT Visit Diagnosis: Other abnormalities of gait and mobility (R26.89);Muscle weakness (generalized) (M62.81);Pain Pain - Right/Left: Right Pain - part of body: Leg                Time: 1010-1035 OT Time Calculation (min): 25 min Charges:  OT General Charges $OT Visit: 1 Visit OT Evaluation $OT Eval Moderate Complexity: 1 Mod  Maurilio CROME, OTR/L.  [facility] Acute Rehabilitation  Office: [phone]   Maurilio PARAS Hermione Havlicek 11/16/2023, 1:17 PM

## 2023-11-16 NOTE — Progress Notes (Signed)
 PROGRESS NOTE    [name]  FMW:[id number] DOB: [birth year] DOA: 11/14/2023 PCP: Rosamond Leta NOVAK, MD    Brief Narrative:  40F h/o CAD s/p CABG in 12/2012, HFpEF (EF 55-60% in 01/2021), COPD (on 3L [place]), HTN, HLD, DM2, and hypothyroidism p/w GLF c/b R ankle fracture.  Status post ORIF 10/18.  Remains with intermittent confusion.  Orthopedics to check her skin on 10/22 and decide timing of surgery after that.  Patient may be able to discharge home prior to surgery pending the date recommended and how she progresses with PT.   Assessment and Plan: R ankle fracture Fragility fracture -Orthopedic surgery -PT/OT consult -PO tylenol  TID, robaxin TID, gabapentin TID-have adjusted oxy to Norco per family request low 25-OH vitamin D  and treat if low w/ ergochalciferol 50,000 IU weekly for 8-12 weeks; pt will need 25-OH vitamin D  re-testing as outpt to ensure repletion in 8-12 weeks; may consider ergochalciferol 2000 IU daily after testing in 8-12 weeks for maintenance therapy    CAD s/p CABG HFpEF -PTA ASA 81mg  daily to begin 10/19, atorvastatin  80mg  daily, bisoprolol  5mg  daily, and lisinopril  20mg  daily   COPD No acute exacerabation -PTA Anora Ellipta daily and Duonebs QID prn   HTN -PTA amlodipine  and lisinopril    DM2 - SSI TID AC prn   Hypothyroid -PTA Synthroid 50 mcg daily   DVT prophylaxis: enoxaparin  (LOVENOX ) injection 40 mg Start: 11/14/23 0945    Code Status: Full Code Family Communication: Called son x 3-no answer  Disposition Plan:  Level of care: Med-Surg Status is: Inpatient     Consultants:  Orthopedics  Subjective: Remains somewhat confused this morning   Objective: Vitals:   11/16/23 0206 11/16/23 0446 11/16/23 0735 11/16/23 0815  BP:  (!) 154/48  (!) 148/52  Pulse: 90 78  82  Resp: 16   15  Temp:  98.2 F (36.8 C)  98.4 F (36.9 C)  TempSrc:  Oral  Oral  SpO2: 91% 95% 91% 91%  Weight:      Height:        Intake/Output Summary (Last 24  hours) at 11/16/2023 1240 Last data filed at 11/16/2023 0446 Gross per 24 hour  Intake --  Output 250 ml  Net -250 ml   Filed Weights   11/14/23 1015  Weight: 60.8 kg    Examination:   General: Appearance:    Well developed, well nourished female in no acute distress     Lungs:   On nasal cannula, no increased work of breathing  Heart:    Normal heart rate..    MS:   All extremities are intact.    Neurologic:   Awake, alert-confused but more pleasant       Data Reviewed: I have personally reviewed following labs and imaging studies  CBC: Recent Labs  Lab 11/14/23 0413 11/14/23 1532 11/15/23 1103 11/16/23 0751  WBC 15.8* 12.5* 9.5 10.6*  NEUTROABS 11.1*  --   --   --   HGB 10.8* 10.7* 8.8* 8.7*  HCT 36.2 36.1 29.5* 28.0*  MCV 93.8 93.8 92.5 89.5  PLT 330 204 265 277   Basic Metabolic Panel: Recent Labs  Lab 11/14/23 0413 11/14/23 1532 11/15/23 1103 11/16/23 0751  NA 138  --  136 139  K 5.3*  --  5.0 4.9  CL 101  --  99 101  CO2 34*  --  29 27  GLUCOSE 96  --  88 124*  BUN 12  --  18 16  CREATININE 1.06* 1.24* 1.22* 1.22*  CALCIUM  9.2  --  8.5* 8.6*   GFR: Estimated Creatinine Clearance: 35.5 mL/min (A) (by C-G formula based on SCr of 1.22 mg/dL (H)). Liver Function Tests: Recent Labs  Lab 11/14/23 0413  AST 16  ALT 6  ALKPHOS 85  BILITOT <0.2  PROT 6.4*  ALBUMIN  3.5   No results for input(s): LIPASE, AMYLASE in the last 168 hours. No results for input(s): AMMONIA in the last 168 hours. Coagulation Profile: No results for input(s): INR, PROTIME in the last 168 hours. Cardiac Enzymes: No results for input(s): CKTOTAL, CKMB, CKMBINDEX, TROPONINI in the last 168 hours. BNP (last 3 results) No results for input(s): PROBNP in the last 8760 hours. HbA1C: Recent Labs    11/14/23 1831  HGBA1C 5.6   CBG: Recent Labs  Lab 11/15/23 0742 11/15/23 1203 11/15/23 1658 11/15/23 2147 11/16/23 0752  GLUCAP 130* 85 115* 143*  117*   Lipid Profile: No results for input(s): CHOL, HDL, LDLCALC, TRIG, CHOLHDL, LDLDIRECT in the last 72 hours. Thyroid  Function Tests: No results for input(s): TSH, T4TOTAL, FREET4, T3FREE, THYROIDAB in the last 72 hours. Anemia Panel: No results for input(s): VITAMINB12, FOLATE, FERRITIN, TIBC, IRON, RETICCTPCT in the last 72 hours. Sepsis Labs: No results for input(s): PROCALCITON, LATICACIDVEN in the last 168 hours.  Recent Results (from the past 240 hours)  Resp panel by RT-PCR (RSV, Flu A&B, Covid) Anterior Nasal Swab     Status: None   Collection Time: 11/14/23  4:14 AM   Specimen: Anterior Nasal Swab  Result Value Ref Range Status   SARS Coronavirus 2 by RT PCR NEGATIVE NEGATIVE Final    Comment: (NOTE) SARS-CoV-2 target nucleic acids are NOT DETECTED.  The SARS-CoV-2 RNA is generally detectable in upper respiratory specimens during the acute phase of infection. The lowest concentration of SARS-CoV-2 viral copies this assay can detect is 138 copies/mL. A negative result does not preclude SARS-Cov-2 infection and should not be used as the sole basis for treatment or other patient management decisions. A negative result may occur with  improper specimen collection/handling, submission of specimen other than nasopharyngeal swab, presence of viral mutation(s) within the areas targeted by this assay, and inadequate number of viral copies(<138 copies/mL). A negative result must be combined with clinical observations, patient history, and epidemiological information. The expected result is Negative.  Fact Sheet for Patients:  BloggerCourse.com  Fact Sheet for Healthcare Providers:  SeriousBroker.it  This test is no t yet approved or cleared by the [place]  FDA and  has been authorized for detection and/or diagnosis of SARS-CoV-2 by FDA under an Emergency Use Authorization (EUA).  This EUA will remain  in effect (meaning this test can be used) for the duration of the COVID-19 declaration under Section 564(b)(1) of the Act, 21 U.S.C.section 360bbb-3(b)(1), unless the authorization is terminated  or revoked sooner.       Influenza A by PCR NEGATIVE NEGATIVE Final   Influenza B by PCR NEGATIVE NEGATIVE Final    Comment: (NOTE) The Xpert Xpress SARS-CoV-2/FLU/RSV plus assay is intended as an aid in the diagnosis of influenza from Nasopharyngeal swab specimens and should not be used as a sole basis for treatment. Nasal washings and aspirates are unacceptable for Xpert Xpress SARS-CoV-2/FLU/RSV testing.  Fact Sheet for Patients: BloggerCourse.com  Fact Sheet for Healthcare Providers: SeriousBroker.it  This test is not yet approved or cleared by the [place]  FDA and has been authorized for detection and/or diagnosis of SARS-CoV-2 by FDA  under an Emergency Use Authorization (EUA). This EUA will remain in effect (meaning this test can be used) for the duration of the COVID-19 declaration under Section 564(b)(1) of the Act, 21 U.S.C. section 360bbb-3(b)(1), unless the authorization is terminated or revoked.     Resp Syncytial Virus by PCR NEGATIVE NEGATIVE Final    Comment: (NOTE) Fact Sheet for Patients: BloggerCourse.com  Fact Sheet for Healthcare Providers: SeriousBroker.it  This test is not yet approved or cleared by the [place]  FDA and has been authorized for detection and/or diagnosis of SARS-CoV-2 by FDA under an Emergency Use Authorization (EUA). This EUA will remain in effect (meaning this test can be used) for the duration of the COVID-19 declaration under Section 564(b)(1) of the Act, 21 U.S.C. section 360bbb-3(b)(1), unless the authorization is terminated or revoked.  Performed at [facility], [address], [place], [place]  [address]   Urine Culture     Status: Abnormal   Collection Time: 11/14/23  4:14 AM   Specimen: Urine, Clean Catch  Result Value Ref Range Status   Specimen Description   Final    URINE, CLEAN CATCH Performed at [facility], [address] [place], [place] [address]    Special Requests   Final    NONE Performed at [facility], [address], [place], [place] [address]    Culture MULTIPLE SPECIES PRESENT, SUGGEST RECOLLECTION (A)  Final   Report Status 11/15/2023 FINAL  Final  Culture, blood (routine x 2)     Status: None (Preliminary result)   Collection Time: 11/14/23  6:28 AM   Specimen: BLOOD RIGHT ARM  Result Value Ref Range Status   Specimen Description   Final    BLOOD RIGHT ARM BOTTLES DRAWN AEROBIC AND ANAEROBIC   Special Requests   Final    Blood Culture results may not be optimal due to an inadequate volume of blood received in culture bottles   Culture   Final    NO GROWTH 2 DAYS Performed at [facility], [address] [place], [place] [address]    Report Status PENDING  Incomplete         Radiology Studies: DG Ankle Complete Right Result Date: 11/14/2023 CLINICAL DATA:  Fracture, mechanical fall. EXAM: RIGHT ANKLE - COMPLETE 3+ VIEW COMPARISON:  Radiographs earlier today FINDINGS: Improved alignment of distal fibular and medial malleolar fractures. The posterior malleolar fracture is obscured due to overlying cast/splint material. Improved mortise alignment. Overlying splint/cast material limits osseous and soft tissue fine detail. IMPRESSION: Improved alignment of distal fibular and medial malleolar fractures. Improved mortise alignment. Posterior malleolar fracture is not well demonstrated on provided views. Electronically Signed   By: Andrea Gasman M.D.   On: 11/14/2023 18:33   DG Ankle Complete Right Result Date: 11/14/2023 CLINICAL DATA:  Elective surgery, closed reduction of right ankle. EXAM: RIGHT ANKLE - COMPLETE 3+ VIEW COMPARISON:  Radiograph  earlier today FINDINGS: Two fluoroscopic spot views of the right ankle submitted from the operating room. Improved ankle alignment. Overlying splint material limits osseous and soft tissue fine detail. The known distal fibular and tibial fractures are not well demonstrated on provided views. Fluoroscopy time 8.8 seconds. Dose 0.4 mGy. IMPRESSION: Intraoperative fluoroscopy during closed reduction of right ankle fracture. Electronically Signed   By: Andrea Gasman M.D.   On: 11/14/2023 16:49   DG C-Arm 1-60 Min-No Report Result Date: 11/14/2023 Fluoroscopy was utilized by the requesting physician.  No radiographic interpretation.        Scheduled  Meds:  acetaminophen   1,000 mg Oral TID   amLODipine   5 mg Oral Daily   arformoterol  15 mcg Nebulization BID   aspirin  EC  81 mg Oral Daily   atorvastatin   80 mg Oral Daily   bisoprolol   5 mg Oral Daily   DULoxetine   30 mg Oral BID   enoxaparin  (LOVENOX ) injection  40 mg Subcutaneous Q24H   gabapentin  300 mg Oral TID   insulin  aspart  0-15 Units Subcutaneous TID WC   ipratropium-albuterol   3 mL Nebulization Q6H   levothyroxine  50 mcg Oral Daily   lisinopril   20 mg Oral Daily   methocarbamol  500 mg Oral TID   Vitamin D  (Ergocalciferol )  50,000 Units Oral Q7 days   Continuous Infusions:   LOS: 2 days    Time spent: 45 minutes spent on chart review, discussion with nursing staff, consultants, updating family and interview/physical exam; more than 50% of that time was spent in counseling and/or coordination of care.    Harlene RAYMOND Bowl, DO [facility] Available via Epic secure chat 7am-7pm After these hours, please refer to coverage provider listed on [email or web address] 11/16/2023, 12:40 PM

## 2023-11-16 NOTE — TOC Initial Note (Signed)
 Transition of Care [facility]) - Initial/Assessment Note    Patient Details  Name: [name] MRN: [id number] Date of Birth: Sep 17, 1950  Transition of Care [facility]) CM/SW Contact:    [name], [name] Phone Number: 11/16/2023, 10:55 AM  Clinical Narrative:                 Pt admitted from home due to mechanical fall. CSW called pt son, [name], to complete SNF workup. [name] stated he would prefer homehealth instead of SNF. CSW informed RNCM.         Patient Goals and CMS Choice            Expected Discharge Plan and Services                                              Prior Living Arrangements/Services                       Activities of Daily Living      Permission Sought/Granted                  Emotional Assessment              Admission diagnosis:  Febrile illness [R50.9] Closed right ankle fracture [S82.891A] Fall, initial encounter [W19.XXXA] Closed fracture dislocation of right ankle, initial encounter [S82.891A] Patient Active Problem List   Diagnosis Date Noted   Closed right ankle fracture 11/14/2023   MDD (major depressive disorder), recurrent severe, 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   Anorexia nervosa (HCC) 10/10/2015   Loss of weight 10/10/2015   Family hx of colon cancer 11/08/2013   Medication management 11/08/2013   DM2 (diabetes mellitus, type 2) (HCC) 01/28/2013   Cardiomyopathy, ischemic- EF 40% at cath 01/24/2013   Dyslipidemia 01/24/2013   STEMI 01/22/13 01/22/2013   Tobacco abuse 01/22/2013   S/P urgent CABG x 4 01/22/13 01/22/2013   PCP:  Rosamond Leta NOVAK, MD Pharmacy:   [facility] [address], [place] - [address] [place] [address] Phone: [phone] Fax: [phone]  [facility] [place], [place] - [address] [place] [address] Phone: [phone] Fax: [phone]     Social Drivers of Health (SDOH) Social  History: SDOH Screenings   Food Insecurity: Patient Unable To Answer (11/15/2023)  Housing: Patient Declined (11/15/2023)  Transportation Needs: Patient Declined (11/15/2023)  Utilities: Patient Declined (11/15/2023)  Social Connections: Patient Declined (11/15/2023)  Tobacco Use: High Risk (11/14/2023)   SDOH Interventions:     Readmission Risk Interventions     No data to display

## 2023-11-17 DIAGNOSIS — W19XXXA Unspecified fall, initial encounter: Secondary | ICD-10-CM | POA: Diagnosis not present

## 2023-11-17 DIAGNOSIS — S82891A Other fracture of right lower leg, initial encounter for closed fracture: Secondary | ICD-10-CM | POA: Diagnosis not present

## 2023-11-17 LAB — CBC
HCT: 27.7 % — ABNORMAL LOW (ref 36.0–46.0)
Hemoglobin: 8.6 g/dL — ABNORMAL LOW (ref 12.0–15.0)
MCH: 27.3 pg (ref 26.0–34.0)
MCHC: 31 g/dL (ref 30.0–36.0)
MCV: 87.9 fL (ref 80.0–100.0)
Platelets: 291 K/uL (ref 150–400)
RBC: 3.15 MIL/uL — ABNORMAL LOW (ref 3.87–5.11)
RDW: 15.9 % — ABNORMAL HIGH (ref 11.5–15.5)
WBC: 7.4 K/uL (ref 4.0–10.5)
nRBC: 0 % (ref 0.0–0.2)

## 2023-11-17 LAB — GLUCOSE, CAPILLARY
Glucose-Capillary: 104 mg/dL — ABNORMAL HIGH (ref 70–99)
Glucose-Capillary: 133 mg/dL — ABNORMAL HIGH (ref 70–99)
Glucose-Capillary: 81 mg/dL (ref 70–99)
Glucose-Capillary: 167 mg/dL — ABNORMAL HIGH (ref 70–99)

## 2023-11-17 LAB — BASIC METABOLIC PANEL WITH GFR
Anion gap: 7 (ref 5–15)
BUN: 14 mg/dL (ref 8–23)
CO2: 30 mmol/L (ref 22–32)
Calcium: 8.3 mg/dL — ABNORMAL LOW (ref 8.9–10.3)
Chloride: 99 mmol/L (ref 98–111)
Creatinine, Ser: 1.11 mg/dL — ABNORMAL HIGH (ref 0.44–1.00)
GFR, Estimated: 52 mL/min — ABNORMAL LOW (ref >60)
Glucose, Bld: 134 mg/dL — ABNORMAL HIGH (ref 70–99)
Potassium: 4.3 mmol/L (ref 3.5–5.1)
Sodium: 136 mmol/L (ref 135–145)

## 2023-11-17 MED ORDER — HYDROCODONE-ACETAMINOPHEN 5-325 MG PO TABS
1.0000 | ORAL_TABLET | Freq: Four times a day (QID) | ORAL | Status: DC | PRN
  Administered 2023-11-17 (×2): 2 via ORAL
  Administered 2023-11-17: 1 via ORAL
  Administered 2023-11-18: 2 via ORAL
  Administered 2023-11-18: 1 via ORAL
  Administered 2023-11-18 – 2023-11-20 (×8): 2 via ORAL
  Filled 2023-11-17 (×13): qty 2

## 2023-11-17 MED ORDER — HYDROCORTISONE 1 % EX CREA
TOPICAL_CREAM | Freq: Two times a day (BID) | CUTANEOUS | Status: AC
  Administered 2023-11-17: 1 via TOPICAL
  Filled 2023-11-17: qty 28

## 2023-11-17 NOTE — Progress Notes (Signed)
 Mobility Specialist Progress Note:    11/17/23 1134  Mobility  Activity Pivoted/transferred from bed to chair  Level of Assistance Moderate assist, patient does 50-74% (+2)  Assistive Device Other (Comment) (HHA)  Distance Ambulated (ft) 4 ft  RLE Weight Bearing Per Provider Order NWB  Activity Response Tolerated well  Mobility Referral Yes  Mobility visit 1 Mobility  Mobility Specialist Start Time (ACUTE ONLY) 1041  Mobility Specialist Stop Time (ACUTE ONLY) 1056  Mobility Specialist Time Calculation (min) (ACUTE ONLY) 15 min   Received pt in bed and agreeable to mobility. Pt required ModA +2 to laterally scoot. Pt c/o RLE pain, otherwise tolerated well. Pt left in chair with alarm on. Personal belongings and call light within reach. All needs met.  Lavanda Pollack Mobility Specialist  Please contact via [employer] or  Rehab Office [phone]

## 2023-11-17 NOTE — Care Management Important Message (Signed)
 Important Message  Patient Details  Name: CHERILYNN SCHOMBURG MRN: [id number] Date of Birth: 1950-12-05   Important Message Given:  Yes - Medicare IM     Jon Cruel 11/17/2023, 4:29 PM

## 2023-11-17 NOTE — Plan of Care (Signed)

## 2023-11-17 NOTE — Progress Notes (Signed)
 Mobility Specialist Progress Note:    11/17/23 1326  Mobility  Activity Ambulated with assistance  Level of Assistance Minimal assist, patient does 75% or more (+2)  Assistive Device  (2 person HHA)  Distance Ambulated (ft) 3 ft  RLE Weight Bearing Per Provider Order NWB  Activity Response Tolerated well  Mobility Referral Yes  Mobility visit 1 Mobility  Mobility Specialist Start Time (ACUTE ONLY) 1215  Mobility Specialist Stop Time (ACUTE ONLY) 1225  Mobility Specialist Time Calculation (min) (ACUTE ONLY) 10 min   Pt received in chair requesting assistance back to bed. Required MinA +2 via HHA to stand and pivot back to bed. Pt c/o pain in RLE and nausea, otherwise tolerated well. Left in bed w/ call bell and personal belongings in reach. All needs met. Bed alarm on.  Thersia Minder Mobility Specialist  Please contact vis Secure Chat or  Rehab Office [phone]

## 2023-11-17 NOTE — Progress Notes (Signed)
 PROGRESS NOTE    SHIRYL RUDDY  FMW:[id number] DOB: 1950-09-08 DOA: 11/14/2023 PCP: Rosamond Leta NOVAK, MD    Brief Narrative:  57F h/o CAD s/p CABG in 12/2012, HFpEF (EF 55-60% in 01/2021), COPD (on 3L [place]), HTN, HLD, DM2, and hypothyroidism p/w GLF c/b R ankle fracture.  Status post ORIF 10/18.  Remains with intermittent confusion.  Orthopedics to check her skin on 10/22 and decide timing of surgery after that.  Patient may be able to discharge home prior to surgery pending the date recommended and how she progresses with PT. Family currently does not want skilled nursing facility  Assessment and Plan: R ankle fracture Fragility fracture -Orthopedic surgery -PT/OT consult -PO tylenol  TID, robaxin TID, gabapentin TID-have adjusted oxy to Norco per family request low 25-OH vitamin D  and treat if low w/ ergochalciferol 50,000 IU weekly for 8-12 weeks; pt will need 25-OH vitamin D  re-testing as outpt to ensure repletion in 8-12 weeks; may consider ergochalciferol 2000 IU daily after testing in 8-12 weeks for maintenance therapy    CAD s/p CABG HFpEF -PTA ASA 81mg  daily to begin 10/19, atorvastatin  80mg  daily, bisoprolol  5mg  daily, and lisinopril  20mg  daily   COPD/chronic respiratory failure No acute exacerabation on chronic home 2 to 3 L -PTA Anora Ellipta daily and Duonebs QID prn   HTN -PTA amlodipine  and lisinopril    DM2 - SSI TID AC prn   Hypothyroid -PTA Synthroid 50 mcg daily   DVT prophylaxis: enoxaparin  (LOVENOX ) injection 40 mg Start: 11/14/23 0945    Code Status: Full Code Family Communication: Called son, no answer and mailbox full  Disposition Plan:  Level of care: Med-Surg Status is: Inpatient     Consultants:  Orthopedics  Subjective: Less confused today  Objective: Vitals:   11/17/23 0057 11/17/23 0335 11/17/23 0752 11/17/23 0812  BP:  (!) 145/48  (!) 166/53  Pulse:  70  65  Resp:  18  16  Temp:  98.2 F (36.8 C)  98.3 F (36.8 C)  TempSrc:   Axillary  Oral  SpO2: 93% 94% 92% 97%  Weight:      Height:        Intake/Output Summary (Last 24 hours) at 11/17/2023 1212 Last data filed at 11/16/2023 2140 Gross per 24 hour  Intake 300 ml  Output 300 ml  Net 0 ml   Filed Weights   11/14/23 1015  Weight: 60.8 kg    Examination:   General: Appearance:  Chronically ill female in no acute distress     Lungs:    respirations unlabored  Heart:    Normal heart rate. .   MS:   All extremities are intact.   Neurologic:   Awake, alert       Data Reviewed: I have personally reviewed following labs and imaging studies  CBC: Recent Labs  Lab 11/14/23 0413 11/14/23 1532 11/15/23 1103 11/16/23 0751 11/17/23 0328  WBC 15.8* 12.5* 9.5 10.6* 7.4  NEUTROABS 11.1*  --   --   --   --   HGB 10.8* 10.7* 8.8* 8.7* 8.6*  HCT 36.2 36.1 29.5* 28.0* 27.7*  MCV 93.8 93.8 92.5 89.5 87.9  PLT 330 204 265 277 291   Basic Metabolic Panel: Recent Labs  Lab 11/14/23 0413 11/14/23 1532 11/15/23 1103 11/16/23 0751 11/17/23 0328  NA 138  --  136 139 136  K 5.3*  --  5.0 4.9 4.3  CL 101  --  99 101 99  CO2 34*  --  29 27 30   GLUCOSE 96  --  88 124* 134*  BUN 12  --  18 16 14   CREATININE 1.06* 1.24* 1.22* 1.22* 1.11*  CALCIUM  9.2  --  8.5* 8.6* 8.3*   GFR: Estimated Creatinine Clearance: 39 mL/min (A) (by C-G formula based on SCr of 1.11 mg/dL (H)). Liver Function Tests: Recent Labs  Lab 11/14/23 0413  AST 16  ALT 6  ALKPHOS 85  BILITOT <0.2  PROT 6.4*  ALBUMIN  3.5   No results for input(s): LIPASE, AMYLASE in the last 168 hours. No results for input(s): AMMONIA in the last 168 hours. Coagulation Profile: No results for input(s): INR, PROTIME in the last 168 hours. Cardiac Enzymes: No results for input(s): CKTOTAL, CKMB, CKMBINDEX, TROPONINI in the last 168 hours. BNP (last 3 results) No results for input(s): PROBNP in the last 8760 hours. HbA1C: Recent Labs    11/14/23 1831  HGBA1C 5.6    CBG: Recent Labs  Lab 11/16/23 1249 11/16/23 1715 11/16/23 2139 11/17/23 0813 11/17/23 1053  GLUCAP 199* 116* 315* 104* 133*   Lipid Profile: No results for input(s): CHOL, HDL, LDLCALC, TRIG, CHOLHDL, LDLDIRECT in the last 72 hours. Thyroid  Function Tests: No results for input(s): TSH, T4TOTAL, FREET4, T3FREE, THYROIDAB in the last 72 hours. Anemia Panel: No results for input(s): VITAMINB12, FOLATE, FERRITIN, TIBC, IRON, RETICCTPCT in the last 72 hours. Sepsis Labs: No results for input(s): PROCALCITON, LATICACIDVEN in the last 168 hours.  Recent Results (from the past 240 hours)  Resp panel by RT-PCR (RSV, Flu A&B, Covid) Anterior Nasal Swab     Status: None   Collection Time: 11/14/23  4:14 AM   Specimen: Anterior Nasal Swab  Result Value Ref Range Status   SARS Coronavirus 2 by RT PCR NEGATIVE NEGATIVE Final    Comment: (NOTE) SARS-CoV-2 target nucleic acids are NOT DETECTED.  The SARS-CoV-2 RNA is generally detectable in upper respiratory specimens during the acute phase of infection. The lowest concentration of SARS-CoV-2 viral copies this assay can detect is 138 copies/mL. A negative result does not preclude SARS-Cov-2 infection and should not be used as the sole basis for treatment or other patient management decisions. A negative result may occur with  improper specimen collection/handling, submission of specimen other than nasopharyngeal swab, presence of viral mutation(s) within the areas targeted by this assay, and inadequate number of viral copies(<138 copies/mL). A negative result must be combined with clinical observations, patient history, and epidemiological information. The expected result is Negative.  Fact Sheet for Patients:  BloggerCourse.com  Fact Sheet for Healthcare Providers:  SeriousBroker.it  This test is no t yet approved or cleared by the [place] FDA and  has been authorized for detection and/or diagnosis of SARS-CoV-2 by FDA under an Emergency Use Authorization (EUA). This EUA will remain  in effect (meaning this test can be used) for the duration of the COVID-19 declaration under Section 564(b)(1) of the Act, 21 U.S.C.section 360bbb-3(b)(1), unless the authorization is terminated  or revoked sooner.       Influenza A by PCR NEGATIVE NEGATIVE Final   Influenza B by PCR NEGATIVE NEGATIVE Final    Comment: (NOTE) The Xpert Xpress SARS-CoV-2/FLU/RSV plus assay is intended as an aid in the diagnosis of influenza from Nasopharyngeal swab specimens and should not be used as a sole basis for treatment. Nasal washings and aspirates are unacceptable for Xpert Xpress SARS-CoV-2/FLU/RSV testing.  Fact Sheet for Patients: BloggerCourse.com  Fact Sheet for Healthcare Providers: SeriousBroker.it  This  test is not yet approved or cleared by the [place]  FDA and has been authorized for detection and/or diagnosis of SARS-CoV-2 by FDA under an Emergency Use Authorization (EUA). This EUA will remain in effect (meaning this test can be used) for the duration of the COVID-19 declaration under Section 564(b)(1) of the Act, 21 U.S.C. section 360bbb-3(b)(1), unless the authorization is terminated or revoked.     Resp Syncytial Virus by PCR NEGATIVE NEGATIVE Final    Comment: (NOTE) Fact Sheet for Patients: BloggerCourse.com  Fact Sheet for Healthcare Providers: SeriousBroker.it  This test is not yet approved or cleared by the [place]  FDA and has been authorized for detection and/or diagnosis of SARS-CoV-2 by FDA under an Emergency Use Authorization (EUA). This EUA will remain in effect (meaning this test can be used) for the duration of the COVID-19 declaration under Section 564(b)(1) of the Act, 21 U.S.C. section  360bbb-3(b)(1), unless the authorization is terminated or revoked.  Performed at [facility], [address] [place], [place] [address]   Urine Culture     Status: Abnormal   Collection Time: 11/14/23  4:14 AM   Specimen: Urine, Clean Catch  Result Value Ref Range Status   Specimen Description   Final    URINE, CLEAN CATCH Performed at [facility], [address] [place], [place] [address]    Special Requests   Final    NONE Performed at [facility], [address] [place], [place] [address]    Culture MULTIPLE SPECIES PRESENT, SUGGEST RECOLLECTION (A)  Final   Report Status 11/15/2023 FINAL  Final  Culture, blood (routine x 2)     Status: None (Preliminary result)   Collection Time: 11/14/23  6:28 AM   Specimen: BLOOD RIGHT ARM  Result Value Ref Range Status   Specimen Description   Final    BLOOD RIGHT ARM BOTTLES DRAWN AEROBIC AND ANAEROBIC   Special Requests   Final    Blood Culture results may not be optimal due to an inadequate volume of blood received in culture bottles   Culture   Final    NO GROWTH 3 DAYS Performed at [facility], [address] [place], [place] [address]    Report Status PENDING  Incomplete         Radiology Studies: CT ANKLE RIGHT WO CONTRAST Result Date: 11/16/2023 CLINICAL DATA:  Ankle trauma, fracture, xray done (Age >= 5y) Closed right ankle fracture sustained in fall. EXAM: CT OF THE RIGHT ANKLE WITHOUT CONTRAST TECHNIQUE: Multidetector CT imaging of the right ankle was performed according to the standard protocol. Multiplanar CT image reconstructions were also generated. RADIATION DOSE REDUCTION: This exam was performed according to the departmental dose-optimization program which includes automated exposure control, adjustment of the mA and/or kV according to patient size and/or use of iterative reconstruction technique. COMPARISON:  Radiographs 11/14/2023. FINDINGS: Bones/Joint/Cartilage The ankle is splinted. The bones are diffusely  demineralized. Trimalleolar fracture demonstrates improved alignment compared with the original radiographs. Fracture of the distal fibular metaphysis is associated with a small mildly displaced avulsion fracture anteriorly. Posterior malleolar fracture is minimally displaced. Fracture through the base of the medial malleolus demonstrates no significant displacement along the articular surface. There is mild displacement and comminution peripherally. No residual tibiotalar subluxation. The talar dome appears intact. Minimal ankle joint effusion. The subtalar joint appears normal. The additional tarsal bones appear normal. No acute findings are seen within the forefoot. There are mild degenerative changes at the 1st metatarsophalangeal joint. Ligaments Suboptimally  assessed by CT. Muscles and Tendons As evaluated by CT, the ankle tendons appear intact without significant tenosynovitis. Generalized muscular atrophy without apparent focal abnormality. Soft tissues Moderate subcutaneous edema surrounding the ankle with extension into the dorsal aspect of the foot. No organized fluid collection, foreign body or soft tissue emphysema identified. IMPRESSION: 1. Trimalleolar fracture with improved alignment compared with the original radiographs. 2. No residual tibiotalar subluxation. 3. No acute findings within the forefoot. 4. Moderate subcutaneous edema surrounding the ankle with extension into the dorsal aspect of the foot. No organized fluid collection, foreign body or soft tissue emphysema identified. Electronically Signed   By: Elsie Perone M.D.   On: 11/16/2023 15:42        Scheduled Meds:  amLODipine   5 mg Oral Daily   arformoterol  15 mcg Nebulization BID   aspirin  EC  81 mg Oral Daily   atorvastatin   80 mg Oral Daily   bisoprolol   5 mg Oral Daily   DULoxetine   30 mg Oral BID   enoxaparin  (LOVENOX ) injection  40 mg Subcutaneous Q24H   gabapentin  300 mg Oral TID   insulin  aspart  0-15 Units  Subcutaneous TID WC   ipratropium-albuterol   3 mL Nebulization Q6H   levothyroxine  50 mcg Oral Daily   lisinopril   20 mg Oral Daily   methocarbamol  500 mg Oral TID   Vitamin D  (Ergocalciferol )  50,000 Units Oral Q7 days   Continuous Infusions:   LOS: 3 days    Time spent: 45 minutes spent on chart review, discussion with nursing staff, consultants, updating family and interview/physical exam; more than 50% of that time was spent in counseling and/or coordination of care.    Harlene RAYMOND Bowl, DO [facility] Available via Epic secure chat 7am-7pm After these hours, please refer to coverage provider listed on [email or web address] 11/17/2023, 12:12 PM

## 2023-11-18 ENCOUNTER — Inpatient Hospital Stay (HOSPITAL_COMMUNITY)

## 2023-11-18 DIAGNOSIS — S82891A Other fracture of right lower leg, initial encounter for closed fracture: Secondary | ICD-10-CM | POA: Diagnosis not present

## 2023-11-18 LAB — GLUCOSE, CAPILLARY
Glucose-Capillary: 135 mg/dL — ABNORMAL HIGH (ref 70–99)
Glucose-Capillary: 131 mg/dL — ABNORMAL HIGH (ref 70–99)
Glucose-Capillary: 37 mg/dL — CL (ref 70–99)
Glucose-Capillary: 56 mg/dL — ABNORMAL LOW (ref 70–99)
Glucose-Capillary: 87 mg/dL (ref 70–99)
Glucose-Capillary: 174 mg/dL — ABNORMAL HIGH (ref 70–99)
Glucose-Capillary: 88 mg/dL (ref 70–99)

## 2023-11-18 MED ORDER — IPRATROPIUM-ALBUTEROL 0.5-2.5 (3) MG/3ML IN SOLN
3.0000 mL | Freq: Three times a day (TID) | RESPIRATORY_TRACT | Status: DC
Start: 2023-11-19 — End: 2023-11-19

## 2023-11-18 MED ORDER — DIPHENHYDRAMINE HCL 25 MG PO CAPS
25.0000 mg | ORAL_CAPSULE | Freq: Once | ORAL | Status: AC
  Administered 2023-11-18: 25 mg via ORAL
  Filled 2023-11-18: qty 1

## 2023-11-18 MED ORDER — HYDROXYZINE HCL 25 MG PO TABS
25.0000 mg | ORAL_TABLET | Freq: Once | ORAL | Status: AC
  Administered 2023-11-18: 25 mg via ORAL
  Filled 2023-11-18: qty 1

## 2023-11-18 MED ORDER — LORATADINE 10 MG PO TABS
10.0000 mg | ORAL_TABLET | Freq: Every day | ORAL | Status: DC
  Administered 2023-11-18 – 2023-11-26 (×9): 10 mg via ORAL
  Filled 2023-11-18 (×9): qty 1

## 2023-11-18 NOTE — Progress Notes (Signed)
 Physical Therapy Treatment Patient Details Name: [name] MRN: [id number] DOB: 01/09/1951 Today's Date: 11/18/2023   History of Present Illness [name] is a 73 y.o. female presents with a ground level fall, resulting in  R ankle fracture; now s/p closed reduction in OR on 10/18, NWB; with medical history significant of COPD (on 3L [place]), HTN, HLD, DM2, and hypothyroidism    PT Comments  Pt resting in bed on arrival, agreeable to session with encouragement as pt reporting increased fatigue this date. Pt progressing OOB mobility this session, able to perform sit<>stand and stand and pivot transfers EOB<>BSC with RW for support with mod A for all aspects. Pt with fair adherence to NWB on R throughout however needing max cues to keep RLE elevated off floor as pt resting toes during transfer and attempting to press up through RLE on rise to stand. Pt continues to be limited in safe mobility by decreased activity tolerance, poor balance/postural reactions and global weakness and will benefit from continued inpatient follow up therapy, <3 hours/day. Pt continues to benefit from skilled PT services to progress toward functional mobility goals.      If plan is discharge home, recommend the following: A lot of help with walking and/or transfers;A lot of help with bathing/dressing/bathroom;Assistance with cooking/housework;Assist for transportation;Help with stairs or ramp for entrance;Supervision due to cognitive status   Can travel by private vehicle     No  Equipment Recommendations  Rolling walker (2 wheels) (will consider drop-arm BSC)    Recommendations for Other Services       Precautions / Restrictions Precautions Precautions: Fall Recall of Precautions/Restrictions: Intact Restrictions Weight Bearing Restrictions Per Provider Order: Yes RLE Weight Bearing Per Provider Order: Non weight bearing     Mobility  Bed Mobility Overal bed mobility: Needs Assistance Bed  Mobility: Supine to Sit, Sit to Supine     Supine to sit: HOB elevated, Contact guard Sit to supine: Min assist   General bed mobility comments: pt able to come to sitting on L EOB with CGA for safety, min A to manage LEs back to bed    Transfers Overall transfer level: Needs assistance Equipment used: Rolling walker (2 wheels) Transfers: Bed to chair/wheelchair/BSC, Sit to/from Stand Sit to Stand: Mod assist Stand pivot transfers: Mod assist         General transfer comment: mod A to boost to stand with pt placing RLE on top of this PTAs foot to monitor for NWB, max cues on rise to stand to not press through RLE and for hand placement, once standing pt with fair adherence, light cues during pivot on LLE to maintain RLE elevated as pt resting toes at floor    Ambulation/Gait               General Gait Details: Unable to progress to gait training at this time.   Stairs             Wheelchair Mobility     Tilt Bed    Modified Rankin (Stroke Patients Only)       Balance Overall balance assessment: Needs assistance Sitting-balance support: Bilateral upper extremity supported, Feet supported (long sitting) Sitting balance-Leahy Scale: Fair Sitting balance - Comments: Maintained long sitting balance without back support on bed.   Standing balance support: Bilateral upper extremity supported Standing balance-Leahy Scale: Poor Standing balance comment: reliant on RW support and external assist  Communication Communication Communication: Impaired Factors Affecting Communication: Reduced clarity of speech  Cognition Arousal: Alert Behavior During Therapy: WFL for tasks assessed/performed   PT - Cognitive impairments: No apparent impairments                         Following commands: Intact Following commands impaired: Follows multi-step commands with increased time    Cueing Cueing Techniques: Verbal  cues, Tactile cues, Visual cues  Exercises      General Comments General comments (skin integrity, edema, etc.): pt on 3L O2 thorughout      Pertinent Vitals/Pain Pain Assessment Pain Assessment: Faces Faces Pain Scale: Hurts little more Pain Location: RLE Pain Descriptors / Indicators: Grimacing, Guarding Pain Intervention(s): Monitored during session, Limited activity within patient's tolerance    Home Living Family/patient expects to be discharged to:: Private residence Living Arrangements: Children                      Prior Function            PT Goals (current goals can now be found in the care plan section) Acute Rehab PT Goals Patient Stated Goal: to go home PT Goal Formulation: With patient Time For Goal Achievement: 11/29/23 Progress towards PT goals: Progressing toward goals    Frequency    Min 2X/week      PT Plan      Co-evaluation              AM-PAC PT 6 Clicks Mobility   Outcome Measure  Help needed turning from your back to your side while in a flat bed without using bedrails?: A Little Help needed moving from lying on your back to sitting on the side of a flat bed without using bedrails?: A Lot Help needed moving to and from a bed to a chair (including a wheelchair)?: Total Help needed standing up from a chair using your arms (e.g., wheelchair or bedside chair)?: Total Help needed to walk in hospital room?: Total Help needed climbing 3-5 steps with a railing? : Total 6 Click Score: 9    End of Session Equipment Utilized During Treatment: Oxygen  Activity Tolerance: Patient limited by pain Patient left: with call bell/phone within reach;in bed;with bed alarm set Nurse Communication: Mobility status PT Visit Diagnosis: Pain;Other abnormalities of gait and mobility (R26.89);History of falling (Z91.81);Muscle weakness (generalized) (M62.81);Difficulty in walking, not elsewhere classified (R26.2) Pain - Right/Left: Right Pain -  part of body: Leg     Time: 8885-8867 PT Time Calculation (min) (ACUTE ONLY): 18 min  Charges:    $Therapeutic Activity: 8-22 mins PT General Charges $$ ACUTE PT VISIT: 1 Visit                     Amanada Philbrick R. PTA Acute Rehabilitation Services Office: [phone]   Therisa CHRISTELLA Boor 11/18/2023, 1:56 PM

## 2023-11-18 NOTE — Plan of Care (Signed)
  Problem: Pain Managment: Goal: General experience of comfort will improve and/or be controlled Outcome: Progressing   Problem: Safety: Goal: Ability to remain free from injury will improve Outcome: Progressing   Problem: Skin Integrity: Goal: Risk for impaired skin integrity will decrease Outcome: Progressing

## 2023-11-18 NOTE — Plan of Care (Signed)
  Problem: Pain Managment: Goal: General experience of comfort will improve and/or be controlled Outcome: Progressing   Problem: Safety: Goal: Ability to remain free from injury will improve Outcome: Progressing

## 2023-11-18 NOTE — Plan of Care (Signed)
  Problem: Pain Managment: Goal: General experience of comfort will improve and/or be controlled 11/18/2023 0143 by Fredda Honor Leash, RN Outcome: Progressing 11/18/2023 0126 by Fredda Honor Leash, RN Outcome: Progressing   Problem: Safety: Goal: Ability to remain free from injury will improve 11/18/2023 0143 by Fredda Honor Leash, RN Outcome: Progressing 11/18/2023 0126 by Fredda Honor Leash, RN Outcome: Progressing

## 2023-11-18 NOTE — Progress Notes (Signed)
 PROGRESS NOTE [name]  FMW:[id number] DOB: 1950/07/10 DOA: 11/14/2023 PCP: Rosamond Leta NOVAK, MD   Brief Narrative:  72F h/o CAD s/p CABG in 12/2012, HFpEF (EF 55-60% in 01/2021), COPD (on 3L [place]), HTN, HLD, DM2, and hypothyroidism p/w GLF c/b R ankle fracture.  Status post closed reduction 10/18. Family currently does not want skilled nursing facility  Assessment and Plan: R ankle fracture  Fragility fracture- s/p closed reduction 10/18.  -f/u Orthopedic surgery- still too much swelling for ORIF. Tentatively scheduled for surgery on Tuesday.  - continue to ice and elevate -PT/OT consult -PO tylenol  TID, robaxin TID, gabapentin TID-have adjusted oxy to Norco per family request low 25-OH vitamin D  and treat if low w/ ergochalciferol 50,000 IU weekly for 8-12 weeks; pt will need 25-OH vitamin D  re-testing as outpt to ensure repletion in 8-12 weeks; may consider ergochalciferol 2000 IU daily after testing in 8-12 weeks for maintenance therapy   CAD s/p CABG HFpEF -PTA ASA 81mg  daily to begin 10/19, atorvastatin  80mg  daily, bisoprolol  5mg  daily, and lisinopril  20mg  daily   COPD/chronic respiratory failure No acute exacerabation on chronic home 2 to 3 L -PTA Anora Ellipta daily and Duonebs QID prn   HTN -PTA amlodipine  and lisinopril    DM2 - SSI TID AC prn   Hypothyroid -PTA Synthroid 50 mcg daily  DVT prophylaxis: enoxaparin  (LOVENOX ) injection 40 mg Start: 11/14/23 0945    Code Status: Full Code Family Communication: none at bedside  Disposition Plan:  Level of care: Med-Surg Status is: Inpatient  Consultants:  Orthopedics  Subjective: Pt reports doing well. Having soreness of her ankle and requests pain medication.   Objective: Vitals:   11/17/23 1419 11/17/23 1626 11/17/23 2046 11/18/23 0536  BP:  (!) 130/41  (!) 129/50  Pulse: 64 65  66  Resp: 16 15  16   Temp:  98 F (36.7 C)  (!) 97.5 F (36.4 C)  TempSrc:  Oral  Oral  SpO2:  95% 96% 98%  Weight:       Height:        Intake/Output Summary (Last 24 hours) at 11/18/2023 0746 Last data filed at 11/17/2023 2100 Gross per 24 hour  Intake 720 ml  Output --  Net 720 ml   Filed Weights   11/14/23 1015  Weight: 60.8 kg    Examination: General: NAD, pleasant, able to participate in exam Cardiac: RRR, normal heart sounds, no murmurs. 2+ radial and PT pulses bilaterally Respiratory: CTAB, normal effort, No wheezes, rales or rhonchi Abdomen: soft, nontender, nondistended, no hepatic or splenomegaly, +BS Extremities: no edema. WWP. Skin: warm and dry, no rashes noted Neuro: alert and oriented, no focal deficits Psych: Normal affect and mood  Data Reviewed: I have personally reviewed following labs and imaging studies  CBC: Recent Labs  Lab 11/14/23 0413 11/14/23 1532 11/15/23 1103 11/16/23 0751 11/17/23 0328  WBC 15.8* 12.5* 9.5 10.6* 7.4  NEUTROABS 11.1*  --   --   --   --   HGB 10.8* 10.7* 8.8* 8.7* 8.6*  HCT 36.2 36.1 29.5* 28.0* 27.7*  MCV 93.8 93.8 92.5 89.5 87.9  PLT 330 204 265 277 291   Basic Metabolic Panel: Recent Labs  Lab 11/14/23 0413 11/14/23 1532 11/15/23 1103 11/16/23 0751 11/17/23 0328  NA 138  --  136 139 136  K 5.3*  --  5.0 4.9 4.3  CL 101  --  99 101 99  CO2 34*  --  29 27 30   GLUCOSE 96  --  88 124* 134*  BUN 12  --  18 16 14   CREATININE 1.06* 1.24* 1.22* 1.22* 1.11*  CALCIUM  9.2  --  8.5* 8.6* 8.3*    LOS: 4 days    Time spent: 45 minutes spent on chart review, discussion with nursing staff, consultants, updating family and interview/physical exam; more than 50% of that time was spent in counseling and/or coordination of care.    Christine LITTIE Piety, DO [facility] Available via Epic secure chat 7am-7pm After these hours, please refer to coverage provider listed on [email or web address] 11/18/2023, 7:46 AM

## 2023-11-18 NOTE — Progress Notes (Signed)
 Contacted provider to inform of patient's new rash covering the left side of her body. Patient also complains of itching stating that the hydrocortisone cream is not working

## 2023-11-18 NOTE — TOC Progression Note (Addendum)
 Transition of Care (TOC) - Progression Note   Son [name] raker NCM . He wants a wheelchair ordered. NCM explained insurance does not usually cover a walker and wheelchair at the same time. [employer] will call him and discuss payment/ coverage . Secure chatted [facility] patient remaining in hospital for surgery on Tuesday  [name] with [employer] updated  Amy with [facility] updated also  Patient Details  Name: [name] MRN: [id number] Date of Birth: February 05, 1950  Transition of Care [facility]) CM/SW Contact  Destenee Guerry, Powell Jansky, RN Phone Number: 11/18/2023, 3:26 PM  Clinical Narrative:       Expected Discharge Plan: Home w Home Health Services Barriers to Discharge: Continued Medical Work up               Expected Discharge Plan and Services       Living arrangements for the past 2 months: Single Family Home                                       Social Drivers of Health (SDOH) Interventions SDOH Screenings   Food Insecurity: Patient Unable To Answer (11/15/2023)  Housing: Patient Declined (11/15/2023)  Transportation Needs: Patient Declined (11/15/2023)  Utilities: Patient Declined (11/15/2023)  Social Connections: Patient Declined (11/15/2023)  Tobacco Use: High Risk (11/14/2023)    Readmission Risk Interventions     No data to display

## 2023-11-18 NOTE — Progress Notes (Signed)
 Orthopaedic Trauma Service Progress Note  Patient ID: [name] MRN: [id number] DOB/AGE: 73/04/1950 73 y.o.  Subjective:  Overall feeling better Does report some L ankle soreness, has had ankle fracture surgery in the 90's  Has been able to bear weight on L ankle for transfers   ROS As above  Today's  total administered Morphine  Milligram Equivalents: 15 Yesterday's total administered Morphine  Milligram Equivalents: 30  Objective:   VITALS:   Vitals:   11/17/23 1626 11/17/23 2046 11/18/23 0536 11/18/23 0759  BP: (!) 130/41  (!) 129/50   Pulse: 65  66   Resp: 15  16   Temp: 98 F (36.7 C)  (!) 97.5 F (36.4 C)   TempSrc: Oral  Oral   SpO2: 95% 96% 98% 91%  Weight:      Height:        Estimated body mass index is 23 kg/m as calculated from the following:   Height as of this encounter: 5' 4 (1.626 m).   Weight as of this encounter: 60.8 kg.   Intake/Output      10/21 0701 10/22 0700 10/22 0701 10/23 0700   P.O. 720    Total Intake(mL/kg) 720 (11.8)    Urine (mL/kg/hr)     Stool     Total Output     Net +720         Urine Occurrence 5 x    Stool Occurrence 0 x      LABS  Results for orders placed or performed during the hospital encounter of 11/14/23 (from the past 24 hours)  Glucose, capillary     Status: Abnormal   Collection Time: 11/17/23 10:53 AM  Result Value Ref Range   Glucose-Capillary 133 (H) 70 - 99 mg/dL   Comment 1 Notify RN   Glucose, capillary     Status: None   Collection Time: 11/17/23  4:07 PM  Result Value Ref Range   Glucose-Capillary 81 70 - 99 mg/dL  Glucose, capillary     Status: Abnormal   Collection Time: 11/17/23  8:56 PM  Result Value Ref Range   Glucose-Capillary 167 (H) 70 - 99 mg/dL  Glucose, capillary     Status: Abnormal   Collection Time: 11/18/23  4:13 AM  Result Value Ref Range   Glucose-Capillary 135 (H) 70 - 99 mg/dL  Glucose,  capillary     Status: Abnormal   Collection Time: 11/18/23  7:50 AM  Result Value Ref Range   Glucose-Capillary 131 (H) 70 - 99 mg/dL   Comment 1 Notify RN      PHYSICAL EXAM:   Gen: Sitting up in bed, no acute distress, pleasant Lungs: Unlabored Ext:       Right lower extremity             SLS is fitting well, good mold   Splint split up middle    Still too swollen but markedly improved              Ext warm             Still with moderate swelling to her right foot             Knee is unremarkable             Motor and sensory functions grossly  intact       Left lower extremity   Moderate ecchymosis to lateral foot and ankle  No significant swelling  Fibular hardware prominent but not any more tender that normal   No instability of ankle noted   Assessment/Plan: 4 Days Post-Op   Principal Problem:   Closed right ankle fracture   Anti-infectives (From admission, onward)    Start     Dose/Rate Route Frequency Ordered Stop   11/14/23 1200  ceFAZolin (ANCEF) IVPB 2g/100 mL premix        2 g 200 mL/hr over 30 Minutes Intravenous On call to O.R. 11/14/23 1008 11/15/23 0559   11/14/23 0600  cefTRIAXone (ROCEPHIN) 1 g in sodium chloride  0.9 % 100 mL IVPB        1 g 200 mL/hr over 30 Minutes Intravenous  Once 11/14/23 0552 11/14/23 0724     .  POD/HD#: 21  73 year old female ground-level fall with right trimalleolar ankle fracture dislocation   -fall   - Right trimalleolar ankle fracture dislocation s/p  closed reduction in OR             Still too swollen for ORIF tomorrow  Will put on schedule for Tuesday 10/28               Continue with aggressive ice and elevation--->  toes above nose               Strict nonweightbearing right leg  - L ankle soreness, ecchymosis  Check xrays    - Pain management:             Multimodal, minimize narcotics   - DVT/PE prophylaxis:             Lovenox    - Metabolic Bone Disease:             Vitamin D  deficiency                          Supplement   - Dispo:             Continue with current care             She could certainly discharge home or to SNF with adequate help and fix her ankle as an outpatient   Francis MICAEL Mt, PA-C [phone] (C) 11/18/2023, 9:34 AM  [facility] [address] [place] [address] [phone] (F)    After 5pm and on the weekends please log on to Amion, go to orthopaedics and the look under the [facility] Call for the provider(s) on call. You can also call our office at [phone] and then follow the prompts to be connected to the call team.  Patient ID: [name], female   DOB: 1950/08/31, 73 y.o.   MRN: [id number]

## 2023-11-18 NOTE — Inpatient Diabetes Management (Signed)
 Inpatient Diabetes Program Recommendations  AACE/ADA: New Consensus Statement on Inpatient Glycemic Control   Target Ranges:  Prepandial:   less than 140 mg/dL      Peak postprandial:   less than 180 mg/dL (1-2 hours)      Critically ill patients:  140 - 180 mg/dL   Lab Results  Component Value Date   GLUCAP 37 (LL) 11/18/2023   HGBA1C 5.6 11/14/2023    Latest Reference Range & Units 11/18/23 04:13 11/18/23 07:50 11/18/23 11:51 11/18/23 12:11  Glucose-Capillary 70 - 99 mg/dL 864 (H) 868 (H) 37 (LL) 56 (L)   Review of Glycemic Control  Diabetes history: DM2  Outpatient Diabetes medications:  Glipizide XL 5mg  BID  Metformin  500mg  daily   Current orders for Inpatient glycemic control:  Novolog  0-15 units TID   Inpatient Diabetes Program Recommendations:   Noted hypoglycemic event (37 mg/dl) today.  Please consider changing Novolog  to 0-6 units TID.   Thanks,  Lavanda Search, RN, MSN, [facility]  Inpatient Diabetes Coordinator  Pager [phone] (8a-5p)

## 2023-11-19 DIAGNOSIS — S82891A Other fracture of right lower leg, initial encounter for closed fracture: Secondary | ICD-10-CM | POA: Diagnosis not present

## 2023-11-19 LAB — GLUCOSE, CAPILLARY
Glucose-Capillary: 188 mg/dL — ABNORMAL HIGH (ref 70–99)
Glucose-Capillary: 80 mg/dL (ref 70–99)
Glucose-Capillary: 167 mg/dL — ABNORMAL HIGH (ref 70–99)
Glucose-Capillary: 109 mg/dL — ABNORMAL HIGH (ref 70–99)

## 2023-11-19 LAB — CULTURE, BLOOD (ROUTINE X 2): Culture: NO GROWTH

## 2023-11-19 MED ORDER — IPRATROPIUM-ALBUTEROL 0.5-2.5 (3) MG/3ML IN SOLN: 3.0000 mL | RESPIRATORY_TRACT | Status: DC | PRN

## 2023-11-19 NOTE — Progress Notes (Signed)
 PROGRESS NOTE [name]  FMW:[id number] DOB: January 11, 1951 DOA: 11/14/2023 PCP: Rosamond Leta NOVAK, MD   Brief Narrative:  53F h/o CAD s/p CABG in 12/2012, HFpEF (EF 55-60% in 01/2021), COPD (on 3L [place]), HTN, HLD, DM2, and hypothyroidism p/w GLF c/b R ankle fracture.  Status post closed reduction 10/18. Family currently does not want skilled nursing facility  Assessment and Plan: R ankle fracture  Fragility fracture- s/p closed reduction 10/18.  -f/u Orthopedic surgery- still too much swelling for ORIF. Tentatively scheduled for surgery on Tuesday.  - continue to ice and elevate the R ankle -PT/OT consult -PO tylenol  TID, robaxin TID, gabapentin TID-have adjusted oxy to Norco per family request low 25-OH vitamin D  and treat if low w/ ergochalciferol 50,000 IU weekly for 8-12 weeks; pt will need 25-OH vitamin D  re-testing as outpt to ensure repletion in 8-12 weeks; may consider ergochalciferol 2000 IU daily after testing in 8-12 weeks for maintenance therapy   CAD s/p CABG HFpEF -PTA ASA 81mg  daily, atorvastatin  80mg  daily, bisoprolol  5mg  daily, and lisinopril  20mg  daily   COPD/chronic respiratory failure No acute exacerabation on chronic home 2 to 3 L -PTA Anora Ellipta daily and Duonebs QID prn   HTN -PTA amlodipine  and lisinopril    DM2 - SSI TID AC prn   Hypothyroid -PTA Synthroid 50 mcg daily  DVT prophylaxis: enoxaparin  (LOVENOX ) injection 40 mg Start: 11/14/23 0945    Code Status: Full Code Family Communication: none at bedside  Disposition Plan:  Level of care: Med-Surg Status is: Inpatient  Consultants:  Orthopedics  Subjective: Pt reports doing well. Denies pain.   Objective: Vitals:   11/18/23 1807 11/18/23 2032 11/18/23 2048 11/19/23 0515  BP: (!) 126/49  (!) 142/46 (!) 128/33  Pulse: (!) 56  67 (!) 59  Resp: 16  17 17   Temp: 98.1 F (36.7 C)  98 F (36.7 C) 98.2 F (36.8 C)  TempSrc: Oral  Oral Oral  SpO2: 96% 97% 97% 97%  Weight:      Height:        No intake or output data in the 24 hours ending 11/19/23 0745  Filed Weights   11/14/23 1015  Weight: 60.8 kg   Examination: General: NAD, pleasant, able to participate in exam Cardiac: RRR, normal heart sounds, no murmurs. 2+ radial and PT pulses bilaterally Respiratory: CTAB, normal effort, No wheezes, rales or rhonchi Extremities: no edema. WWP. R ankle in dressing Skin: warm and dry, no rashes noted Neuro: alert and oriented, no focal deficits Psych: Normal affect and mood  Data Reviewed: I have personally reviewed following labs and imaging studies  CBC: Recent Labs  Lab 11/14/23 0413 11/14/23 1532 11/15/23 1103 11/16/23 0751 11/17/23 0328  WBC 15.8* 12.5* 9.5 10.6* 7.4  NEUTROABS 11.1*  --   --   --   --   HGB 10.8* 10.7* 8.8* 8.7* 8.6*  HCT 36.2 36.1 29.5* 28.0* 27.7*  MCV 93.8 93.8 92.5 89.5 87.9  PLT 330 204 265 277 291   Basic Metabolic Panel: Recent Labs  Lab 11/14/23 0413 11/14/23 1532 11/15/23 1103 11/16/23 0751 11/17/23 0328  NA 138  --  136 139 136  K 5.3*  --  5.0 4.9 4.3  CL 101  --  99 101 99  CO2 34*  --  29 27 30   GLUCOSE 96  --  88 124* 134*  BUN 12  --  18 16 14   CREATININE 1.06* 1.24* 1.22* 1.22* 1.11*  CALCIUM  9.2  --  8.5* 8.6* 8.3*    LOS: 5 days    Time spent: 35 minutes spent on chart review, discussion with nursing staff, consultants, updating family and interview/physical exam; more than 50% of that time was spent in counseling and/or coordination of care.    Christine LITTIE Piety, DO [facility] Available via Epic secure chat 7am-7pm After these hours, please refer to coverage provider listed on [email or web address] 11/19/2023, 7:45 AM

## 2023-11-19 NOTE — Plan of Care (Signed)

## 2023-11-19 NOTE — Progress Notes (Signed)
 Physical Therapy Treatment Patient Details Name: [name] MRN: [id number] DOB: [birth year] Today's Date: 11/19/2023   History of Present Illness [name] is a 73 y.o. female presents with a ground level fall, resulting in R ankle fracture; now s/p closed reduction in OR on 10/18, NWB; with medical history significant of COPD (on 3L [place]), HTN, HLD, DM2, and hypothyroidism    PT Comments  Pt greeted supine in bed, pleasant and agreeable to PT session. She is making steady progress towards her acute PT goals demonstrated by advanced functional mobility with decreased physical assistance. Pt completed sit<>stand and bed<>chair step pivot transfer using RW with CGA-minA. Utilized +2 assist throughout functional mobility for safety. Educated pt in gait training using hopping technique. She was able to take a couple of hops ~66ft. Pt required cues to maintain RLE NWB at all times. She intermittently attempted to rest foot on floor to help steady herself. Pt declined further ambulation attempts d/t worsening L foot pain. Patient will benefit from continued inpatient follow up therapy, <3 hours/day. Per case management, pt/family are declining SNF and report 24/7 assist at home. If pt were to d/c home recommend HHPT and she would be primarily w/c level only using a RW for transfers.         If plan is discharge home, recommend the following: A lot of help with walking and/or transfers;A lot of help with bathing/dressing/bathroom;Assistance with cooking/housework;Assist for transportation;Help with stairs or ramp for entrance;Supervision due to cognitive status   Can travel by private vehicle     No  Equipment Recommendations  Rolling walker (2 wheels)    Recommendations for Other Services       Precautions / Restrictions Precautions Precautions: Fall Recall of Precautions/Restrictions: Intact Precaution/Restrictions Comments: Cues to adhere to weight-bearing precautions throughout  functional mobility. Restrictions Weight Bearing Restrictions Per Provider Order: Yes RLE Weight Bearing Per Provider Order: Non weight bearing     Mobility  Bed Mobility Overal bed mobility: Needs Assistance Bed Mobility: Supine to Sit, Sit to Supine     Supine to sit: Supervision, HOB elevated, Used rails Sit to supine: Supervision   General bed mobility comments: Pt sat up on L side of bed with increased time. She managed RLE in/out of bed. Pulled on bed rail to sit up. Pt scooted left along EOB towards HOB with BUE support and LLE.    Transfers Overall transfer level: Needs assistance Equipment used: Rolling walker (2 wheels) Transfers: Sit to/from Stand, Bed to chair/wheelchair/BSC Sit to Stand: Contact guard assist Stand pivot transfers: Min assist         General transfer comment: Pt stood from lowest bed height and recliner chair. She demonstrated proper hand placement using RW. Powered up with light assist. Transferred to recliner chair on left and bed on right. Cues for sequencing, assist for stability and manuever RW. Pt declined to remain seated in recliner chair. Good eccentric control.    Ambulation/Gait Ambulation/Gait assistance: Min assist Gait Distance (Feet): 4 Feet Assistive device: Rolling walker (2 wheels) Gait Pattern/deviations: Step-to pattern Gait velocity: decreased     General Gait Details: Educated pt on hopping technique. Demonstrated how to maintain RLE NWB and advance RW. She was able to take a couple of short hops with heavy reliance on BUE support on RW. Cues to keep RLE off the ground as pt intermittently rested it on floor for balance. Pt declined further gait attempts d/t worsening L foot pain.   Stairs  Wheelchair Mobility     Tilt Bed    Modified Rankin (Stroke Patients Only)       Balance Overall balance assessment: Needs assistance Sitting-balance support: Bilateral upper extremity supported, Feet  supported Sitting balance-Leahy Scale: Fair Sitting balance - Comments: Pt scooted along EOB with close supervision   Standing balance support: Bilateral upper extremity supported Standing balance-Leahy Scale: Poor Standing balance comment: Pt depenent on RW and external support of therapist.                            Communication Communication Communication: Impaired Factors Affecting Communication: Reduced clarity of speech  Cognition Arousal: Alert Behavior During Therapy: WFL for tasks assessed/performed   PT - Cognitive impairments: No apparent impairments                         Following commands: Intact      Cueing Cueing Techniques: Verbal cues  Exercises General Exercises - Lower Extremity Long Arc Quad: Seated, Both, AROM, 10 reps Hip Flexion/Marching: Seated, Both, AROM, 10 reps Other Exercises Other Exercises: Incentive Spirometer x10 reps.    General Comments General comments (skin integrity, edema, etc.): VSS on 3L O2 via [place].      Pertinent Vitals/Pain Pain Assessment Pain Assessment: Faces Faces Pain Scale: Hurts little more Pain Location: RLE and L dorsal foot Pain Descriptors / Indicators: Discomfort, Aching, Sore Pain Intervention(s): Monitored during session, Limited activity within patient's tolerance, Repositioned    Home Living                          Prior Function            PT Goals (current goals can now be found in the care plan section) Acute Rehab PT Goals Patient Stated Goal: Go Home PT Goal Formulation: With patient Time For Goal Achievement: 11/29/23 Potential to Achieve Goals: Fair Progress towards PT goals: Progressing toward goals    Frequency    Min 2X/week      PT Plan      Co-evaluation              AM-PAC PT 6 Clicks Mobility   Outcome Measure  Help needed turning from your back to your side while in a flat bed without using bedrails?: A Little Help needed moving  from lying on your back to sitting on the side of a flat bed without using bedrails?: A Little Help needed moving to and from a bed to a chair (including a wheelchair)?: A Little Help needed standing up from a chair using your arms (e.g., wheelchair or bedside chair)?: A Little Help needed to walk in hospital room?: Total Help needed climbing 3-5 steps with a railing? : Total 6 Click Score: 14    End of Session Equipment Utilized During Treatment: Gait belt;Oxygen  Activity Tolerance: Patient tolerated treatment well;Patient limited by pain Patient left: in bed;with call bell/phone within reach;with bed alarm set Nurse Communication: Mobility status PT Visit Diagnosis: Pain;Other abnormalities of gait and mobility (R26.89);History of falling (Z91.81);Muscle weakness (generalized) (M62.81);Difficulty in walking, not elsewhere classified (R26.2) Pain - Right/Left: Right Pain - part of body: Leg     Time: 1536-1601 PT Time Calculation (min) (ACUTE ONLY): 25 min  Charges:    $Gait Training: 8-22 mins $Therapeutic Activity: 8-22 mins PT General Charges $$ ACUTE PT VISIT: 1 Visit  Randall SAUNDERS, PT, DPT Acute Rehabilitation Services Office: [phone] Secure Chat Preferred  [name] 11/19/2023, 4:47 PM

## 2023-11-20 DIAGNOSIS — S82891A Other fracture of right lower leg, initial encounter for closed fracture: Secondary | ICD-10-CM | POA: Diagnosis not present

## 2023-11-20 LAB — GLUCOSE, CAPILLARY
Glucose-Capillary: 121 mg/dL — ABNORMAL HIGH (ref 70–99)
Glucose-Capillary: 68 mg/dL — ABNORMAL LOW (ref 70–99)
Glucose-Capillary: 140 mg/dL — ABNORMAL HIGH (ref 70–99)
Glucose-Capillary: 137 mg/dL — ABNORMAL HIGH (ref 70–99)

## 2023-11-20 MED ORDER — HYDROCODONE-ACETAMINOPHEN 5-325 MG PO TABS
1.0000 | ORAL_TABLET | Freq: Four times a day (QID) | ORAL | Status: DC | PRN
  Administered 2023-11-20 (×2): 2 via ORAL
  Administered 2023-11-21: 1 via ORAL
  Administered 2023-11-21 (×2): 2 via ORAL
  Administered 2023-11-21: 1 via ORAL
  Administered 2023-11-22 – 2023-11-25 (×11): 2 via ORAL
  Filled 2023-11-20 (×17): qty 2

## 2023-11-20 MED ORDER — HYDROMORPHONE HCL 1 MG/ML IJ SOLN
0.5000 mg | Freq: Once | INTRAMUSCULAR | Status: AC
  Administered 2023-11-20: 0.5 mg via INTRAVENOUS
  Filled 2023-11-20: qty 0.5

## 2023-11-20 MED ORDER — HYDROMORPHONE HCL 1 MG/ML IJ SOLN
0.5000 mg | INTRAMUSCULAR | Status: AC | PRN
  Administered 2023-11-20 – 2023-11-22 (×6): 0.5 mg via INTRAVENOUS
  Filled 2023-11-20 (×7): qty 0.5

## 2023-11-20 NOTE — Plan of Care (Signed)

## 2023-11-20 NOTE — Plan of Care (Signed)

## 2023-11-20 NOTE — Progress Notes (Addendum)
 PROGRESS NOTE [name]  FMW:[id number] DOB: 14-Jul-1950 DOA: 11/14/2023 PCP: Rosamond Leta NOVAK, MD   Brief Narrative:  87F h/o CAD s/p CABG in 12/2012, HFpEF (EF 55-60% in 01/2021), COPD (on 3L [place]), HTN, HLD, DM2, and hypothyroidism p/w GLF c/b R ankle fracture.  Status post closed reduction 10/18. Family currently does not want skilled nursing facility  Assessment and Plan: R ankle fracture  Fragility fracture- s/p closed reduction 10/18.  -f/u Orthopedic surgery- still too much swelling for ORIF. Tentatively scheduled for surgery on Tuesday.  - continue to ice and elevate -PT/OT consult -PO tylenol  TID, robaxin TID, gabapentin TID-have adjusted oxy to Norco per family request low 25-OH vitamin D  and treat if low w/ ergochalciferol 50,000 IU weekly for 8-12 weeks; pt will need 25-OH vitamin D  re-testing as outpt to ensure repletion in 8-12 weeks; may consider ergochalciferol 2000 IU daily after testing in 8-12 weeks for maintenance therapy   CAD s/p CABG HFpEF -PTA ASA 81mg  daily to begin 10/19, atorvastatin  80mg  daily, bisoprolol  5mg  daily, and lisinopril  20mg  daily   COPD/chronic respiratory failure No acute exacerabation on chronic home 2 to 3 L -PTA Anora Ellipta daily and Duonebs QID prn   HTN -PTA amlodipine  and lisinopril    DM2 - SSI TID AC prn   Hypothyroid -PTA Synthroid 50 mcg daily  DVT prophylaxis: enoxaparin  (LOVENOX ) injection 40 mg Start: 11/14/23 0945    Code Status: Full Code Family Communication: none at bedside  Disposition Plan:  Level of care: Med-Surg Status is: Inpatient  Consultants:  Orthopedics  Subjective: Pt reports doing well. Having soreness of her ankle and requests pain medication.   Objective: Vitals:   11/19/23 1953 11/20/23 0802 11/20/23 0933 11/20/23 1641  BP: (!) 125/58  (!) 125/59 (!) 138/45  Pulse: 65 68 60 61  Resp: 17 19 19 16   Temp: (!) 97.5 F (36.4 C)  98 F (36.7 C)   TempSrc: Oral  Oral   SpO2: 98% 98% 96% 95%   Weight:      Height:       No intake or output data in the 24 hours ending 11/20/23 1642  Filed Weights   11/14/23 1015  Weight: 60.8 kg    Examination: General: NAD, pleasant, able to participate in exam Cardiac: RRR, normal heart sounds, no murmurs. 2+ radial and PT pulses bilaterally Respiratory: CTAB, normal effort, No wheezes, rales or rhonchi Abdomen: soft, nontender, nondistended, no hepatic or splenomegaly, +BS Extremities: no edema. WWP. Skin: warm and dry, no rashes noted Neuro: alert and oriented, no focal deficits Psych: Normal affect and mood  Data Reviewed: I have personally reviewed following labs and imaging studies  CBC: Recent Labs  Lab 11/14/23 0413 11/14/23 1532 11/15/23 1103 11/16/23 0751 11/17/23 0328  WBC 15.8* 12.5* 9.5 10.6* 7.4  NEUTROABS 11.1*  --   --   --   --   HGB 10.8* 10.7* 8.8* 8.7* 8.6*  HCT 36.2 36.1 29.5* 28.0* 27.7*  MCV 93.8 93.8 92.5 89.5 87.9  PLT 330 204 265 277 291   Basic Metabolic Panel: Recent Labs  Lab 11/14/23 0413 11/14/23 1532 11/15/23 1103 11/16/23 0751 11/17/23 0328  NA 138  --  136 139 136  K 5.3*  --  5.0 4.9 4.3  CL 101  --  99 101 99  CO2 34*  --  29 27 30   GLUCOSE 96  --  88 124* 134*  BUN 12  --  18 16 14   CREATININE 1.06*  1.24* 1.22* 1.22* 1.11*  CALCIUM  9.2  --  8.5* 8.6* 8.3*    LOS: 6 days    Time spent: 45 minutes spent on chart review, discussion with nursing staff, consultants, updating family and interview/physical exam; more than 50% of that time was spent in counseling and/or coordination of care.    Christine LILLETTE Chapel, MD [facility] Available via Epic secure chat 7am-7pm After these hours, please refer to coverage provider listed on [email or web address] 11/20/2023, 4:42 PM

## 2023-11-20 NOTE — Inpatient Diabetes Management (Signed)
 Inpatient Diabetes Program Recommendations  AACE/ADA: New Consensus Statement on Inpatient Glycemic Control (2015)  Target Ranges:  Prepandial:   less than 140 mg/dL      Peak postprandial:   less than 180 mg/dL (1-2 hours)      Critically ill patients:  140 - 180 mg/dL   Lab Results  Component Value Date   GLUCAP 68 (L) 11/20/2023   HGBA1C 5.6 11/14/2023    Latest Reference Range & Units 11/19/23 12:11 11/19/23 18:31 11/19/23 19:59 11/20/23 07:48 11/20/23 11:39  Glucose-Capillary 70 - 99 mg/dL 80 832 (H) 890 (H) 878 (H) 68 (L)  (H): Data is abnormally high (L): Data is abnormally low Review of Glycemic Control  Diabetes history: DM2 Outpatient Diabetes medications: Glipizide XL 5 mg BID, Metformin  500 mg daily Current orders for Inpatient glycemic control: Novolog  0-15 units correction scale TID  Inpatient Diabetes Program Recommendations:   Noted that patient had a low CBG of 68 mg/dl today.  Recommend decreasing Novolog  scale to 0-6 units TID if blood sugars continue to be less than 70 mg/dl.  Marjorie Lunger RN BSN CDE Diabetes Coordinator Pager: [phone]  8am-5pm

## 2023-11-21 DIAGNOSIS — S82891D Other fracture of right lower leg, subsequent encounter for closed fracture with routine healing: Secondary | ICD-10-CM | POA: Diagnosis not present

## 2023-11-21 LAB — GLUCOSE, CAPILLARY
Glucose-Capillary: 129 mg/dL — ABNORMAL HIGH (ref 70–99)
Glucose-Capillary: 180 mg/dL — ABNORMAL HIGH (ref 70–99)
Glucose-Capillary: 83 mg/dL (ref 70–99)
Glucose-Capillary: 128 mg/dL — ABNORMAL HIGH (ref 70–99)
Glucose-Capillary: 116 mg/dL — ABNORMAL HIGH (ref 70–99)

## 2023-11-21 LAB — CREATININE, SERUM
Creatinine, Ser: 1.12 mg/dL — ABNORMAL HIGH (ref 0.44–1.00)
GFR, Estimated: 52 mL/min — ABNORMAL LOW (ref >60)

## 2023-11-21 NOTE — Progress Notes (Signed)
 Occupational Therapy Treatment Patient Details Name: [name] MRN: [id number] DOB: 03/14/1950 Today's Date: 11/21/2023   History of present illness [name] is a 73 y.o. female presents with a ground level fall, resulting in R ankle fracture; now s/p closed reduction in OR on 10/18, NWB; with medical history significant of COPD (on 3L [place]), HTN, HLD, DM2, and hypothyroidism   OT comments  Patient premedicated prior to session. Patient demonstrating good gains with bed mobility with supervision to get to EOB.  Sit to stands addressed while seated on EOB with patient unable to come to complete stand x3 due to complaints of LLE foot pain with WB. Patient returned to supine and performed BUE HEP to increase functional strength to assist with walker use and maintaining WB precautions.  Patient will benefit from continued inpatient follow up therapy, <3 hours/day but patient and family are considering home and would benefit from [facility] to follow.  Acute OT to continue to follow to address established goals to facilitate DC to next venue of care.        If plan is discharge home, recommend the following:  Two people to help with walking and/or transfers;A lot of help with bathing/dressing/bathroom;Assistance with cooking/housework;Assist for transportation;Help with stairs or ramp for entrance   Equipment Recommendations  BSC/3in1;Other (comment) (drop arm BSC; RW)    Recommendations for Other Services      Precautions / Restrictions Precautions Precautions: Fall Recall of Precautions/Restrictions: Intact Restrictions Weight Bearing Restrictions Per Provider Order: Yes RLE Weight Bearing Per Provider Order: Non weight bearing       Mobility Bed Mobility Overal bed mobility: Needs Assistance Bed Mobility: Supine to Sit, Sit to Supine     Supine to sit: Supervision, HOB elevated, Used rails Sit to supine: Supervision   General bed mobility comments: patient able to manage  RLE for bed mobility    Transfers Overall transfer level: Needs assistance Equipment used: Rolling walker (2 wheels) Transfers: Sit to/from Stand Sit to Stand: Max assist, From elevated surface           General transfer comment: sit to stands attempted x3 with patient unable to clear bottom stating increased left foot pain with WB     Balance Overall balance assessment: Needs assistance Sitting-balance support: Bilateral upper extremity supported, Feet supported Sitting balance-Leahy Scale: Fair Sitting balance - Comments: on EOB       Standing balance comment: unable to stand on this date                           ADL either performed or assessed with clinical judgement   ADL Overall ADL's : Needs assistance/impaired                                       General ADL Comments: declined self care tasks, stating I already did that with nursing    Extremity/Trunk Assessment              Vision       Perception     Praxis     Communication Communication Communication: Impaired   Cognition Arousal: Alert Behavior During Therapy: [facility] for tasks assessed/performed Cognition: No apparent impairments  Following commands: Intact Following commands impaired: Follows multi-step commands with increased time      Cueing   Cueing Techniques: Verbal cues  Exercises Exercises: General Upper Extremity General Exercises - Upper Extremity Shoulder Flexion: Strengthening, Both, 10 reps, Supine, Theraband Theraband Level (Shoulder Flexion): Level 2 (Red) Shoulder Horizontal ABduction: Strengthening, Both, 10 reps, Supine, Theraband Theraband Level (Shoulder Horizontal Abduction): Level 2 (Red)    Shoulder Instructions       General Comments      Pertinent Vitals/ Pain       Pain Assessment Pain Assessment: Faces Faces Pain Scale: Hurts whole lot Pain Location: L foot > RLE Pain  Descriptors / Indicators: Discomfort, Aching, Sore Pain Intervention(s): Limited activity within patient's tolerance, Monitored during session, Repositioned, Premedicated before session  Home Living                                          Prior Functioning/Environment              Frequency  Min 2X/week        Progress Toward Goals  OT Goals(current goals can now be found in the care plan section)  Progress towards OT goals: Progressing toward goals  Acute Rehab OT Goals Patient Stated Goal: to go home OT Goal Formulation: With patient Time For Goal Achievement: 11/30/23 Potential to Achieve Goals: Good ADL Goals Pt Will Perform Lower Body Bathing: with min assist;with adaptive equipment;sitting/lateral leans Pt Will Perform Lower Body Dressing: with min assist;with adaptive equipment;sitting/lateral leans Pt Will Transfer to Toilet: with contact guard assist;bedside commode;anterior/posterior transfer Pt Will Perform Toileting - Clothing Manipulation and hygiene: with mod assist;sitting/lateral leans  Plan      Co-evaluation                 AM-PAC OT 6 Clicks Daily Activity     Outcome Measure   Help from another person eating meals?: A Little Help from another person taking care of personal grooming?: A Little Help from another person toileting, which includes using toliet, bedpan, or urinal?: Total Help from another person bathing (including washing, rinsing, drying)?: A Lot Help from another person to put on and taking off regular upper body clothing?: A Little Help from another person to put on and taking off regular lower body clothing?: A Lot 6 Click Score: 14    End of Session Equipment Utilized During Treatment: Oxygen ;Rolling walker (2 wheels)  OT Visit Diagnosis: Other abnormalities of gait and mobility (R26.89);Muscle weakness (generalized) (M62.81);Pain Pain - Right/Left: Right Pain - part of body: Leg (left foot)    Activity Tolerance Patient limited by pain   Patient Left in bed;with call bell/phone within reach;with bed alarm set   Nurse Communication Mobility status        Time: 8949-8884 OT Time Calculation (min): 25 min  Charges: OT General Charges $OT Visit: 1 Visit OT Treatments $Therapeutic Activity: 8-22 mins $Therapeutic Exercise: 8-22 mins  Dick Laine, OTA Acute Rehabilitation Services  Office [phone]   Jeb LITTIE Laine 11/21/2023, 1:40 PM

## 2023-11-21 NOTE — Plan of Care (Signed)
  Problem: Pain Managment: Goal: General experience of comfort will improve and/or be controlled Outcome: Progressing   Problem: Safety: Goal: Ability to remain free from injury will improve Outcome: Progressing

## 2023-11-21 NOTE — Progress Notes (Signed)
  PROGRESS NOTE [name]  FMW:[id number] DOB: 10-18-1950 DOA: 11/14/2023 PCP: Christine Leta NOVAK, MD   Brief Narrative:  Patient is a 73 year old female with past medical history significant for COPD, coronary artery disease, nicotine  use, depression, diabetes, hypertension, myocardial infarction, chronic back pain, arthritis, anxiety and postoperative nausea and vomiting.  Patient was admitted with right ankle fracture.  Patient is awaiting surgery.  Hopefully, surgery will take place on Tuesday, 11/24/2023.  Status post closed reduction 10/18.  11/21/2023: Patient seen.  No new changes.  Awaiting surgery.    Assessment and Plan: R ankle fracture  Fragility fracture: Status post closed reduction 11/14/2023.  -f/u Orthopedic surgery- still too much swelling for ORIF. Tentatively scheduled for surgery on Tuesday, 11/24/2023.  - continue to ice and elevate -PT/OT consult   CAD s/p CABG HFpEF -PTA ASA 81mg  daily to begin 10/19, atorvastatin  80mg  daily, bisoprolol  5mg  daily, and lisinopril  20mg  daily   COPD/chronic respiratory failure Stable.   HTN -PTA amlodipine  and lisinopril  11/21/2023: Reasonably controlled.  Continue to monitor closely   DM2 - SSI TID AC prn   Hypothyroid -PTA Synthroid 50 mcg daily  DVT prophylaxis: enoxaparin  (LOVENOX ) injection 40 mg Start: 11/14/23 0945    Code Status: Full Code Family Communication: none at bedside  Disposition Plan:  Level of care: Med-Surg Status is: Inpatient  Consultants:  Orthopedics  Subjective: Patient seen. No new complaints.    Objective: Vitals:   11/21/23 0514 11/21/23 0842 11/21/23 1004 11/21/23 1804  BP: (!) 164/46 (!) 154/51 (!) 142/49 (!) 137/51  Pulse: 64 62 65 (!) 58  Resp: 18 18 16 16   Temp: 98.4 F (36.9 C) 98.4 F (36.9 C) 97.9 F (36.6 C) 98.2 F (36.8 C)  TempSrc:  Oral Oral Oral  SpO2: 98%  100% 97%  Weight:      Height:       No intake or output data in the 24 hours ending 11/21/23  1952  Filed Weights   11/14/23 1015  Weight: 60.8 kg    Examination: General: Not in any distress.  Awake and alert.   Cardiac: S1-S2.   Respiratory: Clear to auscultation. Abdomen: Soft and nontender.  Data Reviewed: I have personally reviewed following labs and imaging studies  CBC: Recent Labs  Lab 11/15/23 1103 11/16/23 0751 11/17/23 0328  WBC 9.5 10.6* 7.4  HGB 8.8* 8.7* 8.6*  HCT 29.5* 28.0* 27.7*  MCV 92.5 89.5 87.9  PLT 265 277 291   Basic Metabolic Panel: Recent Labs  Lab 11/15/23 1103 11/16/23 0751 11/17/23 0328 11/21/23 0605  NA 136 139 136  --   K 5.0 4.9 4.3  --   CL 99 101 99  --   CO2 29 27 30   --   GLUCOSE 88 124* 134*  --   BUN 18 16 14   --   CREATININE 1.22* 1.22* 1.11* 1.12*  CALCIUM  8.5* 8.6* 8.3*  --     LOS: 7 days     Christine LILLETTE Chapel, MD [facility] Available via Epic secure chat 7am-7pm After these hours, please refer to coverage provider listed on [email or web address] 11/21/2023, 7:52 PM

## 2023-11-21 NOTE — Plan of Care (Signed)
  Problem: Education: Goal: Knowledge of General Education information will improve Description: Including pain rating scale, medication(s)/side effects and non-pharmacologic comfort measures Outcome: Progressing   Problem: Clinical Measurements: Goal: Will remain free from infection Outcome: Progressing   Problem: Nutrition: Goal: Adequate nutrition will be maintained Outcome: Progressing   Problem: Coping: Goal: Level of anxiety will decrease Outcome: Progressing   Problem: Elimination: Goal: Will not experience complications related to urinary retention Outcome: Progressing   Problem: Safety: Goal: Ability to remain free from injury will improve Outcome: Progressing   Problem: Skin Integrity: Goal: Risk for impaired skin integrity will decrease Outcome: Progressing   Problem: Metabolic: Goal: Ability to maintain appropriate glucose levels will improve Outcome: Progressing

## 2023-11-22 DIAGNOSIS — S82891D Other fracture of right lower leg, subsequent encounter for closed fracture with routine healing: Secondary | ICD-10-CM | POA: Diagnosis not present

## 2023-11-22 LAB — GLUCOSE, CAPILLARY
Glucose-Capillary: 131 mg/dL — ABNORMAL HIGH (ref 70–99)
Glucose-Capillary: 157 mg/dL — ABNORMAL HIGH (ref 70–99)
Glucose-Capillary: 159 mg/dL — ABNORMAL HIGH (ref 70–99)
Glucose-Capillary: 106 mg/dL — ABNORMAL HIGH (ref 70–99)

## 2023-11-22 MED ORDER — HYDROMORPHONE HCL 1 MG/ML IJ SOLN
0.5000 mg | INTRAMUSCULAR | Status: AC | PRN
  Administered 2023-11-22 – 2023-11-24 (×5): 0.5 mg via INTRAVENOUS
  Filled 2023-11-22 (×6): qty 0.5

## 2023-11-22 MED ORDER — INFLUENZA VAC SPLIT HIGH-DOSE 0.5 ML IM SUSY
0.5000 mL | PREFILLED_SYRINGE | INTRAMUSCULAR | Status: AC
  Administered 2023-11-26: 0.5 mL via INTRAMUSCULAR
  Filled 2023-11-22: qty 0.5

## 2023-11-22 NOTE — Plan of Care (Signed)
  Problem: Clinical Measurements: Goal: Will remain free from infection Outcome: Progressing   Problem: Nutrition: Goal: Adequate nutrition will be maintained Outcome: Progressing   Problem: Elimination: Goal: Will not experience complications related to bowel motility Outcome: Progressing Goal: Will not experience complications related to urinary retention Outcome: Progressing   Problem: Safety: Goal: Ability to remain free from injury will improve Outcome: Progressing   Problem: Skin Integrity: Goal: Risk for impaired skin integrity will decrease Outcome: Progressing   Problem: Nutritional: Goal: Maintenance of adequate nutrition will improve Outcome: Progressing

## 2023-11-22 NOTE — Progress Notes (Signed)
  PROGRESS NOTE ABILENE MCPHEE  FMW:[id number] DOB: 1950-04-08 DOA: 11/14/2023 PCP: Rosamond Leta NOVAK, MD   Brief Narrative:  Patient is a 73 year old female with past medical history significant for COPD, coronary artery disease, nicotine  use, depression, diabetes, hypertension, myocardial infarction, chronic back pain, arthritis, anxiety and postoperative nausea and vomiting.  Patient was admitted with right ankle fracture.  Patient is awaiting surgery.  Hopefully, surgery will take place on Tuesday, 11/24/2023.  Status post closed reduction 10/18.  11/22/2023:  Patient seen.  No new changes.  Awaiting surgery.    Assessment and Plan: R ankle fracture  Fragility fracture: -Status post closed reduction 11/14/2023.  -f/u Orthopedic surgery -Still too much swelling for ORIF.  -Tentatively scheduled for surgery on Tuesday, 11/24/2023.  -Continue to ice and elevate -PT/OT consult   CAD s/p CABG HFpEF -PTA ASA 81mg  daily to begin 10/19, atorvastatin  80mg  daily, bisoprolol  5mg  daily, and lisinopril  20mg  daily   COPD/chronic respiratory failure Stable.   HTN -PTA amlodipine  and lisinopril  11/22/2023: Reasonably controlled.  Continue to monitor closely   DM2 - SSI TID AC prn   Hypothyroid -PTA Synthroid 50 mcg daily  DVT prophylaxis: enoxaparin  (LOVENOX ) injection 40 mg Start: 11/14/23 0945    Code Status: Full Code Family Communication: none at bedside  Disposition Plan:  Level of care: Med-Surg Status is: Inpatient  Consultants:  Orthopedics  Subjective: Patient seen. No new complaints.    Objective: Vitals:   11/21/23 2125 11/21/23 2152 11/22/23 0553 11/22/23 0814  BP:  (!) 150/43 (!) 155/48 (!) 151/39  Pulse:  (!) 59 60 60  Resp:  17 18 18   Temp:  97.7 F (36.5 C) 98 F (36.7 C) 97.8 F (36.6 C)  TempSrc:   Oral Oral  SpO2: 97% 96% 96% 96%  Weight:      Height:        Intake/Output Summary (Last 24 hours) at 11/22/2023 1652 Last data filed at 11/22/2023  1200 Gross per 24 hour  Intake 480 ml  Output 300 ml  Net 180 ml    Filed Weights   11/14/23 1015  Weight: 60.8 kg    Examination: General: Not in any distress.  Awake and alert.   Cardiac: S1-S2.   Respiratory: Clear to auscultation. Abdomen: Soft and nontender.  Data Reviewed: I have personally reviewed following labs and imaging studies  CBC: Recent Labs  Lab 11/16/23 0751 11/17/23 0328  WBC 10.6* 7.4  HGB 8.7* 8.6*  HCT 28.0* 27.7*  MCV 89.5 87.9  PLT 277 291   Basic Metabolic Panel: Recent Labs  Lab 11/16/23 0751 11/17/23 0328 11/21/23 0605  NA 139 136  --   K 4.9 4.3  --   CL 101 99  --   CO2 27 30  --   GLUCOSE 124* 134*  --   BUN 16 14  --   CREATININE 1.22* 1.11* 1.12*  CALCIUM  8.6* 8.3*  --     LOS: 8 days     [name], MD [facility] Available via Epic secure chat 7am-7pm After these hours, please refer to coverage provider listed on [email or web address] 11/22/2023, 4:52 PM

## 2023-11-22 NOTE — Progress Notes (Shared)
 Reached out to provider, Rosario, MD about holding bp meds due to diastolic being low. Provider instructed to hold bp meds this AM.  11/22/23 0814  Vitals  Temp 97.8 F (36.6 C)  Temp Source Oral  BP (!) 151/39  MAP (mmHg) 71  BP Location Right Arm  BP Method Automatic  Patient Position (if appropriate) Lying  Pulse Rate 60  Pulse Rate Source Dinamap  Resp 18  Level of Consciousness  Level of Consciousness Alert  MEWS COLOR  MEWS Score Color Green  Oxygen  Therapy  SpO2 96 %  O2 Device Room Air  O2 Flow Rate (L/min) 3 L/min  Pain Assessment  Pain Scale 0-10  Pain Score 9  Pain Location Leg  Pain Orientation Right  Pain Descriptors / Indicators Aching  MEWS Score  MEWS Temp 0  MEWS Systolic 0  MEWS Pulse 0  MEWS RR 0  MEWS LOC 0  MEWS Score 0

## 2023-11-23 DIAGNOSIS — S82891D Other fracture of right lower leg, subsequent encounter for closed fracture with routine healing: Secondary | ICD-10-CM | POA: Diagnosis not present

## 2023-11-23 LAB — GLUCOSE, CAPILLARY
Glucose-Capillary: 249 mg/dL — ABNORMAL HIGH (ref 70–99)
Glucose-Capillary: 71 mg/dL (ref 70–99)
Glucose-Capillary: 144 mg/dL — ABNORMAL HIGH (ref 70–99)
Glucose-Capillary: 172 mg/dL — ABNORMAL HIGH (ref 70–99)

## 2023-11-23 MED ORDER — CEFAZOLIN SODIUM-DEXTROSE 2-4 GM/100ML-% IV SOLN
2.0000 g | INTRAVENOUS | Status: AC
  Administered 2023-11-24: 2 g via INTRAVENOUS
  Filled 2023-11-23 (×2): qty 100

## 2023-11-23 MED ORDER — HYDROXYZINE HCL 25 MG PO TABS: 25.0000 mg | ORAL_TABLET | Freq: Three times a day (TID) | ORAL | Status: DC | PRN

## 2023-11-23 MED ORDER — ORAL CARE MOUTH RINSE: 15.0000 mL | OROMUCOSAL | Status: DC | PRN

## 2023-11-23 NOTE — Anesthesia Preprocedure Evaluation (Addendum)
 Anesthesia Evaluation  Patient identified by MRN, date of birth, ID band Patient awake    Reviewed: Allergy & Precautions, NPO status , Patient's Chart, lab work & pertinent test results, reviewed documented beta blocker date and time   History of Anesthesia Complications (+) PONV and history of anesthetic complications  Airway Mallampati: II  TM Distance: >3 FB     Dental  (+) Edentulous Upper, Edentulous Lower   Pulmonary COPD (3L ATC),  COPD inhaler and oxygen  dependent, Current Smoker and Patient abstained from smoking.   Pulmonary exam normal breath sounds clear to auscultation       Cardiovascular hypertension, Pt. on medications and Pt. on home beta blockers + CAD, + Past MI and + CABG  Normal cardiovascular exam Rhythm:Regular Rate:Normal  STEMI 01/22/2013  CABG x 4 01/22/2013  EKG 11/14/23 Sinus rhythm Borderline right axis deviation Low voltage, precordial leads Abnormal T, consider ischemia, lateral leads  ECHO 2023:  1. Left ventricular ejection fraction, by estimation, is 55 to 60%. The  left ventricle has normal function. The left ventricle demonstrates  regional wall motion abnormalities (see scoring diagram/findings for  description). Left ventricular diastolic  parameters are consistent with Grade I diastolic dysfunction (impaired  relaxation). Mildly reduced global longitudinal strain of -14%.   2. Right ventricular systolic function is normal. The right ventricular  size is normal. Tricuspid regurgitation signal is inadequate for assessing  PA pressure.   3. The mitral valve is grossly normal. Mild mitral valve regurgitation.   4. The aortic valve is tricuspid. Aortic valve regurgitation is not  visualized. Aortic valve sclerosis/calcification is present, without any  evidence of aortic stenosis. Aortic valve mean gradient measures 4.0 mmHg.   5. The inferior vena cava is normal in size with greater  than 50%  respiratory variability, suggesting right atrial pressure of 3 mmHg.   EKG   Neuro/Psych  PSYCHIATRIC DISORDERS Anxiety Depression    negative neurological ROS     GI/Hepatic negative GI ROS, Neg liver ROS,,,  Endo/Other  diabetes, Well Controlled, Type 2, Oral Hypoglycemic AgentsHypothyroidism  HLD  Renal/GU negative Renal ROS  negative genitourinary   Musculoskeletal  (+) Arthritis , Osteoarthritis,  Right ankle Fx/dislocation S/P CR   Abdominal Normal abdominal exam  (+)   Peds  Hematology  (+) Blood dyscrasia, anemia Lab Results      Component                Value               Date                      WBC                      7.4                 11/17/2023                HGB                      8.6 (L)             11/17/2023                HCT                      27.7 (L)  11/17/2023                MCV                      87.9                11/17/2023                PLT                      291                 11/17/2023              Anesthesia Other Findings   Reproductive/Obstetrics negative OB ROS                              Anesthesia Physical Anesthesia Plan  ASA: 3  Anesthesia Plan: Spinal   Post-op Pain Management: Ofirmev  IV (intra-op)*, Precedex , Regional block* and Minimal or no pain anticipated   Induction: Intravenous  PONV Risk Score and Plan: 3 and Ondansetron , Dexamethasone, Treatment may vary due to age or medical condition and Propofol  infusion  Airway Management Planned: Simple Face Mask  Additional Equipment: None  Intra-op Plan:   Post-operative Plan:   Informed Consent: I have reviewed the patients History and Physical, chart, labs and discussed the procedure including the risks, benefits and alternatives for the proposed anesthesia with the patient or authorized representative who has indicated his/her understanding and acceptance.     Dental advisory given  Plan Discussed  with: CRNA and Anesthesiologist  Anesthesia Plan Comments:          Anesthesia Quick Evaluation

## 2023-11-23 NOTE — Plan of Care (Signed)

## 2023-11-23 NOTE — Progress Notes (Cosign Needed Addendum)
 Orthopaedic Trauma Service Progress Note  Patient ID: [name] MRN: [id number] DOB/AGE: 73/04/1950 73 y.o.  Subjective:  No acute orthopedic issues with respect to her right ankle Ready for surgery  Still complaining of soreness and pain in her left ankle however x-rays are negative for acute fracture.  Remote ORIF of left ankle with stable hardware  On Norco chronically for chronic back pain.  Baseline MME's are 22.5.  She has been averaging around 60-70 MME's during her stay thus far   ROS As above  Today's  total administered Morphine  Milligram Equivalents: 10 Yesterday's total administered Morphine  Milligram Equivalents: 70  Objective:   VITALS:   Vitals:   11/22/23 2021 11/22/23 2122 11/23/23 0543 11/23/23 0810  BP:  (!) 161/45 (!) 160/51   Pulse:  60 72   Resp:  17 20   Temp:  98.1 F (36.7 C) 99.1 F (37.3 C)   TempSrc:  Oral    SpO2: 94% 98% 99% 98%  Weight:      Height:        Estimated body mass index is 23 kg/m as calculated from the following:   Height as of this encounter: 5' 4 (1.626 m).   Weight as of this encounter: 60.8 kg.   Intake/Output      10/26 0701 10/27 0700 10/27 0701 10/28 0700   P.O. 360    Total Intake(mL/kg) 360 (5.9)    Urine (mL/kg/hr) 1550 (1.1)    Stool 0    Total Output 1550    Net -1190         Urine Occurrence 2 x    Stool Occurrence 1 x      LABS  Results for orders placed or performed during the hospital encounter of 11/14/23 (from the past 24 hours)  Glucose, capillary     Status: Abnormal   Collection Time: 11/22/23 11:50 AM  Result Value Ref Range   Glucose-Capillary 157 (H) 70 - 99 mg/dL  Glucose, capillary     Status: Abnormal   Collection Time: 11/22/23  5:04 PM  Result Value Ref Range   Glucose-Capillary 159 (H) 70 - 99 mg/dL  Glucose, capillary     Status: Abnormal   Collection Time: 11/22/23  7:55 PM  Result Value  Ref Range   Glucose-Capillary 106 (H) 70 - 99 mg/dL  Glucose, capillary     Status: Abnormal   Collection Time: 11/23/23  8:30 AM  Result Value Ref Range   Glucose-Capillary 249 (H) 70 - 99 mg/dL     PHYSICAL EXAM:   Gen: Awake and alert, no acute distress, interactive and pleasant  Lungs: Unlabored Ext:       Right lower extremity             SLS is fitting well, good mold             Ext warm             Swelling is markedly improved             Knee is unremarkable             Motor and sensory functions grossly intact  + DP pulse     Assessment/Plan: 9 Days Post-Op   Principal Problem:   Closed right ankle fracture  Anti-infectives (From admission, onward)    Start     Dose/Rate Route Frequency Ordered Stop   11/24/23 0600  ceFAZolin (ANCEF) IVPB 2g/100 mL premix        2 g 200 mL/hr over 30 Minutes Intravenous On call to O.R. 11/23/23 0854 11/25/23 0559   11/14/23 1200  ceFAZolin (ANCEF) IVPB 2g/100 mL premix        2 g 200 mL/hr over 30 Minutes Intravenous On call to O.R. 11/14/23 1008 11/15/23 0559   11/14/23 0600  cefTRIAXone (ROCEPHIN) 1 g in sodium chloride  0.9 % 100 mL IVPB        1 g 200 mL/hr over 30 Minutes Intravenous  Once 11/14/23 0552 11/14/23 0724     .  POD/HD#:   73 year old female ground-level fall with right trimalleolar ankle fracture dislocation   -fall   - Right trimalleolar ankle fracture dislocation s/p  closed reduction in OR             OR tomorrow for ORIF right ankle                          Continue with aggressive ice and elevation--->  toes above nose               Strict nonweightbearing right leg   Should be stable for discharge on postoperative day #1 following ORIF   - L ankle soreness, ecchymosis             X-rays are negative  May need bracing with an Aircast or similar to facilitate mobilization however she will ultimately likely be wheelchair dependent given deconditioning and weakness   - Pain  management:             Multimodal, minimize narcotics   - DVT/PE prophylaxis:             Lovenox   Will put on Eliquis for 30 days postoperatively for DVT prophylaxis   - Metabolic Bone Disease:             Vitamin D  deficiency                         Supplement   - Dispo:             NPO after MN  OR tomorrow for ORIF right ankle   Francis MICAEL Mt, PA-C [phone] (C) 11/23/2023, 9:06 AM  [facility] [address] [place] [address] [phone] (F)    After 5pm and on the weekends please log on to Amion, go to orthopaedics and the look under the [facility] Call for the provider(s) on call. You can also call our office at [phone] and then follow the prompts to be connected to the call team.  Patient ID: [name] Mt, female   DOB: [birth year], 74 y.o.   MRN: [id number]

## 2023-11-23 NOTE — Plan of Care (Signed)
  Problem: Education: Goal: Knowledge of General Education information will improve Description: Including pain rating scale, medication(s)/side effects and non-pharmacologic comfort measures Outcome: Progressing   Problem: Health Behavior/Discharge Planning: Goal: Ability to manage health-related needs will improve Outcome: Progressing   Problem: Clinical Measurements: Goal: Will remain free from infection Outcome: Progressing   Problem: Activity: Goal: Risk for activity intolerance will decrease Outcome: Progressing   Problem: Coping: Goal: Level of anxiety will decrease Outcome: Progressing   Problem: Elimination: Goal: Will not experience complications related to bowel motility Outcome: Progressing   Problem: Elimination: Goal: Will not experience complications related to urinary retention Outcome: Progressing

## 2023-11-23 NOTE — Progress Notes (Signed)
  PROGRESS NOTE [name]  FMW:[id number] DOB: [birth year] DOA: 11/14/2023 PCP: Rosamond Leta NOVAK, MD   Brief Narrative:  Patient is a 73 year old female with past medical history significant for COPD, coronary artery disease, nicotine  use, depression, diabetes, hypertension, myocardial infarction, chronic back pain, arthritis, anxiety and postoperative nausea and vomiting.  Patient was admitted with right ankle fracture.  Patient is awaiting surgery.  Hopefully, surgery will take place on Tuesday, 11/24/2023.  Status post closed reduction 10/18.  11/23/2023:  Patient seen.  No new changes.  For surgery of the right foot tomorrow, 11/24/2023.      Assessment and Plan: R ankle fracture  Fragility fracture: -Status post closed reduction 11/14/2023.  -f/u Orthopedic surgery -Still too much swelling for ORIF.  -Tentatively scheduled for surgery on Tuesday, 11/24/2023.  -Continue to ice and elevate -PT/OT consult   CAD s/p CABG HFpEF -PTA ASA 81mg  daily to begin 10/19, atorvastatin  80mg  daily, bisoprolol  5mg  daily, and lisinopril  20mg  daily   COPD/chronic respiratory failure Stable.   HTN -PTA amlodipine  and lisinopril  11/22/2023: Reasonably controlled.  Continue to monitor closely   DM2 - SSI TID AC prn   Hypothyroid -PTA Synthroid 50 mcg daily  DVT prophylaxis: enoxaparin  (LOVENOX ) injection 40 mg Start: 11/14/23 0945    Code Status: Full Code Family Communication: none at bedside  Disposition Plan:  Level of care: Med-Surg Status is: Inpatient  Consultants:  Orthopedics  Subjective: Patient seen. No new complaints.    Objective: Vitals:   11/23/23 0543 11/23/23 0810 11/23/23 0905 11/23/23 1712  BP: (!) 160/51  (!) 160/51 (!) 154/47  Pulse: 72   63  Resp: 20   17  Temp: 99.1 F (37.3 C)   (!) 97.5 F (36.4 C)  TempSrc:      SpO2: 99% 98%  95%  Weight:      Height:        Intake/Output Summary (Last 24 hours) at 11/23/2023 1932 Last data filed at  11/23/2023 1700 Gross per 24 hour  Intake 720 ml  Output 1200 ml  Net -480 ml    Filed Weights   11/14/23 1015  Weight: 60.8 kg    Examination: General: Not in any distress.  Awake and alert.   Cardiac: S1-S2.   Respiratory: Clear to auscultation. Abdomen: Soft and nontender.  Data Reviewed: I have personally reviewed following labs and imaging studies  CBC: Recent Labs  Lab 11/17/23 0328  WBC 7.4  HGB 8.6*  HCT 27.7*  MCV 87.9  PLT 291   Basic Metabolic Panel: Recent Labs  Lab 11/17/23 0328 11/21/23 0605  NA 136  --   K 4.3  --   CL 99  --   CO2 30  --   GLUCOSE 134*  --   BUN 14  --   CREATININE 1.11* 1.12*  CALCIUM  8.3*  --     LOS: 9 days     [name], MD [facility] Available via Epic secure chat 7am-7pm After these hours, please refer to coverage provider listed on [email or web address] 11/23/2023, 7:32 PM

## 2023-11-24 ENCOUNTER — Encounter (HOSPITAL_COMMUNITY)
Admission: EM | Disposition: A | Discharge status: Home Health | Payer: Self-pay | Source: Home / Self Care | Attending: Internal Medicine

## 2023-11-24 ENCOUNTER — Inpatient Hospital Stay (HOSPITAL_COMMUNITY): Payer: Self-pay | Admitting: Anesthesiology

## 2023-11-24 ENCOUNTER — Inpatient Hospital Stay (HOSPITAL_COMMUNITY)

## 2023-11-24 ENCOUNTER — Encounter (HOSPITAL_COMMUNITY): Payer: Self-pay | Admitting: Hospitalist

## 2023-11-24 DIAGNOSIS — I10 Essential (primary) hypertension: Secondary | ICD-10-CM

## 2023-11-24 DIAGNOSIS — I2510 Atherosclerotic heart disease of native coronary artery without angina pectoris: Secondary | ICD-10-CM

## 2023-11-24 DIAGNOSIS — S82891A Other fracture of right lower leg, initial encounter for closed fracture: Secondary | ICD-10-CM | POA: Diagnosis not present

## 2023-11-24 DIAGNOSIS — S82851A Displaced trimalleolar fracture of right lower leg, initial encounter for closed fracture: Secondary | ICD-10-CM | POA: Diagnosis not present

## 2023-11-24 DIAGNOSIS — F17210 Nicotine dependence, cigarettes, uncomplicated: Secondary | ICD-10-CM | POA: Diagnosis not present

## 2023-11-24 HISTORY — PX: ORIF ANKLE FRACTURE: SHX5408

## 2023-11-24 LAB — BASIC METABOLIC PANEL WITH GFR
Anion gap: 10 (ref 5–15)
BUN: 15 mg/dL (ref 8–23)
CO2: 29 mmol/L (ref 22–32)
Calcium: 9 mg/dL (ref 8.9–10.3)
Chloride: 100 mmol/L (ref 98–111)
Creatinine, Ser: 1.17 mg/dL — ABNORMAL HIGH (ref 0.44–1.00)
GFR, Estimated: 49 mL/min — ABNORMAL LOW (ref >60)
Glucose, Bld: 160 mg/dL — ABNORMAL HIGH (ref 70–99)
Potassium: 5.3 mmol/L — ABNORMAL HIGH (ref 3.5–5.1)
Sodium: 139 mmol/L (ref 135–145)

## 2023-11-24 LAB — CBC
HCT: 28.9 % — ABNORMAL LOW (ref 36.0–46.0)
Hemoglobin: 8.9 g/dL — ABNORMAL LOW (ref 12.0–15.0)
MCH: 27.4 pg (ref 26.0–34.0)
MCHC: 30.8 g/dL (ref 30.0–36.0)
MCV: 88.9 fL (ref 80.0–100.0)
Platelets: 571 K/uL — ABNORMAL HIGH (ref 150–400)
RBC: 3.25 MIL/uL — ABNORMAL LOW (ref 3.87–5.11)
RDW: 16.9 % — ABNORMAL HIGH (ref 11.5–15.5)
WBC: 8.8 K/uL (ref 4.0–10.5)
nRBC: 0 % (ref 0.0–0.2)

## 2023-11-24 LAB — SURGICAL PCR SCREEN
MRSA, PCR: NEGATIVE
Staphylococcus aureus: NEGATIVE

## 2023-11-24 LAB — GLUCOSE, CAPILLARY
Glucose-Capillary: 132 mg/dL — ABNORMAL HIGH (ref 70–99)
Glucose-Capillary: 129 mg/dL — ABNORMAL HIGH (ref 70–99)
Glucose-Capillary: 161 mg/dL — ABNORMAL HIGH (ref 70–99)
Glucose-Capillary: 181 mg/dL — ABNORMAL HIGH (ref 70–99)
Glucose-Capillary: 87 mg/dL (ref 70–99)

## 2023-11-24 SURGERY — OPEN REDUCTION INTERNAL FIXATION (ORIF) ANKLE FRACTURE
Anesthesia: Monitor Anesthesia Care | Site: Ankle | Laterality: Right

## 2023-11-24 MED ORDER — ORAL CARE MOUTH RINSE: 15.0000 mL | Freq: Once | OROMUCOSAL | Status: AC

## 2023-11-24 MED ORDER — POLYETHYLENE GLYCOL 3350 17 G PO PACK: 17.0000 g | PACK | Freq: Every day | ORAL | Status: DC | PRN

## 2023-11-24 MED ORDER — PHENYLEPHRINE HCL-NACL 20-0.9 MG/250ML-% IV SOLN
INTRAVENOUS | Status: DC | PRN
  Administered 2023-11-24: 50 ug/min via INTRAVENOUS

## 2023-11-24 MED ORDER — PHENYLEPHRINE 80 MCG/ML (10ML) SYRINGE FOR IV PUSH (FOR BLOOD PRESSURE SUPPORT)
PREFILLED_SYRINGE | INTRAVENOUS | Status: DC | PRN
  Administered 2023-11-24: 160 ug via INTRAVENOUS
  Administered 2023-11-24: 80 ug via INTRAVENOUS
  Administered 2023-11-24: 160 ug via INTRAVENOUS

## 2023-11-24 MED ORDER — BUPIVACAINE IN DEXTROSE 0.75-8.25 % IT SOLN
INTRATHECAL | Status: DC | PRN
Start: 2023-11-24 — End: 2023-11-24
  Administered 2023-11-24: 1.7 mL via INTRATHECAL

## 2023-11-24 MED ORDER — LACTATED RINGERS IV SOLN
INTRAVENOUS | Status: DC | PRN
Start: 2023-11-24 — End: 2023-11-24

## 2023-11-24 MED ORDER — FENTANYL CITRATE (PF) 100 MCG/2ML IJ SOLN
INTRAMUSCULAR | Status: AC
  Filled 2023-11-24: qty 2

## 2023-11-24 MED ORDER — FENTANYL CITRATE (PF) 100 MCG/2ML IJ SOLN
INTRAMUSCULAR | Status: DC | PRN
  Administered 2023-11-24: 100 ug via INTRAVENOUS

## 2023-11-24 MED ORDER — LACTATED RINGERS IV SOLN: INTRAVENOUS | Status: DC

## 2023-11-24 MED ORDER — ONDANSETRON HCL 4 MG/2ML IJ SOLN: 4.0000 mg | Freq: Once | INTRAMUSCULAR | Status: DC | PRN

## 2023-11-24 MED ORDER — 0.9 % SODIUM CHLORIDE (POUR BTL) OPTIME
TOPICAL | Status: DC | PRN
  Administered 2023-11-24: 1000 mL

## 2023-11-24 MED ORDER — HYDROMORPHONE HCL 1 MG/ML IJ SOLN
0.5000 mg | INTRAMUSCULAR | Status: DC | PRN
Start: 2023-11-24 — End: 2023-11-25
  Administered 2023-11-25 (×2): 0.5 mg via INTRAVENOUS
  Filled 2023-11-24 (×2): qty 0.5

## 2023-11-24 MED ORDER — OXYCODONE HCL 5 MG/5ML PO SOLN: 5.0000 mg | Freq: Once | ORAL | Status: DC | PRN

## 2023-11-24 MED ORDER — PROCHLORPERAZINE EDISYLATE 10 MG/2ML IJ SOLN
5.0000 mg | Freq: Four times a day (QID) | INTRAMUSCULAR | Status: DC | PRN
  Administered 2023-11-24: 5 mg via INTRAVENOUS
  Filled 2023-11-24: qty 2

## 2023-11-24 MED ORDER — BUPIVACAINE HCL (PF) 0.5 % IJ SOLN
INTRAMUSCULAR | Status: DC | PRN
  Administered 2023-11-24: 25 mL via PERINEURAL

## 2023-11-24 MED ORDER — SENNOSIDES-DOCUSATE SODIUM 8.6-50 MG PO TABS
1.0000 | ORAL_TABLET | Freq: Every day | ORAL | Status: DC
  Administered 2023-11-25: 1 via ORAL
  Filled 2023-11-24 (×2): qty 1

## 2023-11-24 MED ORDER — INSULIN ASPART 100 UNIT/ML IJ SOLN: 0.0000 [IU] | INTRAMUSCULAR | Status: DC | PRN

## 2023-11-24 MED ORDER — CHLORHEXIDINE GLUCONATE 0.12 % MT SOLN: 15.0000 mL | Freq: Once | OROMUCOSAL | Status: AC

## 2023-11-24 MED ORDER — DROPERIDOL 2.5 MG/ML IJ SOLN: 0.6250 mg | Freq: Once | INTRAMUSCULAR | Status: DC | PRN

## 2023-11-24 MED ORDER — ROPIVACAINE HCL 5 MG/ML IJ SOLN
INTRAMUSCULAR | Status: DC | PRN
  Administered 2023-11-24: 20 mL via PERINEURAL

## 2023-11-24 MED ORDER — SODIUM ZIRCONIUM CYCLOSILICATE 10 G PO PACK
10.0000 g | PACK | Freq: Once | ORAL | Status: AC
  Administered 2023-11-24: 10 g via ORAL
  Filled 2023-11-24: qty 1

## 2023-11-24 MED ORDER — MIDAZOLAM HCL (PF) 2 MG/2ML IJ SOLN
INTRAMUSCULAR | Status: DC | PRN
  Administered 2023-11-24 (×2): 1 mg via INTRAVENOUS

## 2023-11-24 MED ORDER — MIDAZOLAM HCL 2 MG/2ML IJ SOLN
INTRAMUSCULAR | Status: AC
Start: 2023-11-24 — End: 2023-11-24
  Filled 2023-11-24: qty 2

## 2023-11-24 MED ORDER — CEFAZOLIN SODIUM-DEXTROSE 2-4 GM/100ML-% IV SOLN
2.0000 g | Freq: Three times a day (TID) | INTRAVENOUS | Status: AC
  Administered 2023-11-24 – 2023-11-25 (×3): 2 g via INTRAVENOUS
  Filled 2023-11-24 (×3): qty 100

## 2023-11-24 MED ORDER — OXYCODONE HCL 5 MG PO TABS: 5.0000 mg | ORAL_TABLET | Freq: Once | ORAL | Status: DC | PRN

## 2023-11-24 MED ORDER — CHLORHEXIDINE GLUCONATE 0.12 % MT SOLN
OROMUCOSAL | Status: AC
  Administered 2023-11-24: 15 mL via OROMUCOSAL
  Filled 2023-11-24: qty 15

## 2023-11-24 MED ORDER — HYDROMORPHONE HCL 1 MG/ML IJ SOLN: 0.2500 mg | INTRAMUSCULAR | Status: DC | PRN

## 2023-11-24 MED ORDER — PROPOFOL 500 MG/50ML IV EMUL
INTRAVENOUS | Status: DC | PRN
  Administered 2023-11-24: 30 ug/kg/min via INTRAVENOUS

## 2023-11-24 MED ORDER — EPHEDRINE SULFATE-NACL 50-0.9 MG/10ML-% IV SOSY
PREFILLED_SYRINGE | INTRAVENOUS | Status: DC | PRN
  Administered 2023-11-24 (×2): 10 mg via INTRAVENOUS

## 2023-11-24 SURGICAL SUPPLY — 66 items
BAG COUNTER SPONGE SURGICOUNT (BAG) ×1 IMPLANT
BIT DRILL 2.4 AO COUPLING CANN (BIT) IMPLANT
BIT DRILL SHORT 2.0 ZI (BIT) IMPLANT
BIT DRILL SHORT 2.5 (BIT) IMPLANT
BNDG COMPR ESMARK 6X3 LF (GAUZE/BANDAGES/DRESSINGS) ×1 IMPLANT
BNDG ELASTIC 4INX 5YD STR LF (GAUZE/BANDAGES/DRESSINGS) IMPLANT
BNDG ELASTIC 4X5.8 VLCR STR LF (GAUZE/BANDAGES/DRESSINGS) IMPLANT
BNDG ELASTIC 6INX 5YD STR LF (GAUZE/BANDAGES/DRESSINGS) IMPLANT
BNDG GAUZE DERMACEA FLUFF 4 (GAUZE/BANDAGES/DRESSINGS) ×2 IMPLANT
BRUSH SCRUB EZ 4% CHG (MISCELLANEOUS) ×1 IMPLANT
BRUSH SCRUB EZ PLAIN DRY (MISCELLANEOUS) ×2 IMPLANT
COVER MAYO STAND STRL (DRAPES) ×1 IMPLANT
COVER SURGICAL LIGHT HANDLE (MISCELLANEOUS) ×1 IMPLANT
CUFF TRNQT CYL 34X4.125X (TOURNIQUET CUFF) ×1 IMPLANT
DRAPE C-ARM 42X72 X-RAY (DRAPES) ×1 IMPLANT
DRAPE C-ARMOR (DRAPES) ×1 IMPLANT
DRAPE HALF SHEET 40X57 (DRAPES) ×1 IMPLANT
DRAPE U-SHAPE 47X51 STRL (DRAPES) ×1 IMPLANT
DRIVER RETENTION T15 LONG (ORTHOPEDIC DISPOSABLE SUPPLIES) IMPLANT
DRSG EMULSION OIL 3X3 NADH (GAUZE/BANDAGES/DRESSINGS) IMPLANT
DRSG MEPITEL 4X7.2 (GAUZE/BANDAGES/DRESSINGS) IMPLANT
ELECTRODE REM PT RTRN 9FT ADLT (ELECTROSURGICAL) ×1 IMPLANT
GAUZE SPONGE 4X4 12PLY STRL (GAUZE/BANDAGES/DRESSINGS) ×2 IMPLANT
GLOVE BIO SURGEON STRL SZ8 (GLOVE) ×2 IMPLANT
GLOVE BIOGEL PI IND STRL 7.5 (GLOVE) ×1 IMPLANT
GLOVE BIOGEL PI IND STRL 8 (GLOVE) ×1 IMPLANT
GLOVE SURG ORTHO LTX SZ7.5 (GLOVE) ×2 IMPLANT
GOWN STRL REUS W/ TWL LRG LVL3 (GOWN DISPOSABLE) ×2 IMPLANT
GOWN STRL REUS W/ TWL XL LVL3 (GOWN DISPOSABLE) ×1 IMPLANT
KIT BASIN OR (CUSTOM PROCEDURE TRAY) ×1 IMPLANT
KIT TURNOVER KIT B (KITS) ×1 IMPLANT
KWIRE ALPS MXV 1.6X6 ZI (WIRE) IMPLANT
KWIRE TROC 1.25X150 (WIRE) IMPLANT
MANIFOLD NEPTUNE II (INSTRUMENTS) ×1 IMPLANT
NDL HYPO 21X1.5 SAFETY (NEEDLE) IMPLANT
NEEDLE HYPO 21X1.5 SAFETY (NEEDLE) IMPLANT
PACK GENERAL/GYN (CUSTOM PROCEDURE TRAY) ×1 IMPLANT
PACK ORTHO EXTREMITY (CUSTOM PROCEDURE TRAY) ×1 IMPLANT
PAD ABD 8X10 STRL (GAUZE/BANDAGES/DRESSINGS) IMPLANT
PAD ARMBOARD POSITIONER FOAM (MISCELLANEOUS) ×2 IMPLANT
PAD CAST 4YDX4 CTTN HI CHSV (CAST SUPPLIES) IMPLANT
PADDING CAST ABS COTTON 4X4 ST (CAST SUPPLIES) IMPLANT
PADDING CAST COTTON 6X4 STRL (CAST SUPPLIES) IMPLANT
PENCIL BUTTON HOLSTER BLD 10FT (ELECTRODE) IMPLANT
PLATE LAT FIB 6H RT (Plate) IMPLANT
SCREW CANN PT 4X36 NS (Screw) IMPLANT
SCREW LOCK 3.5X10 (Screw) IMPLANT
SCREW LOCK MDS 2.7X12 (Screw) IMPLANT
SCREW LOCK MDS 2.7X14 (Screw) IMPLANT
SCREW LOCK MDS 2.7X16 (Screw) IMPLANT
SCREW LOCK MDS 3.5X12 (Screw) IMPLANT
SCREW NON-LOCK 3.5X10 (Screw) IMPLANT
SOLN 0.9% NACL POUR BTL 1000ML (IV SOLUTION) ×1 IMPLANT
SOLN STERILE WATER BTL 1000 ML (IV SOLUTION) ×1 IMPLANT
SPLINT PLASTER CAST FAST 5X30 (CAST SUPPLIES) IMPLANT
SPONGE T-LAP 18X18 ~~LOC~~+RFID (SPONGE) ×1 IMPLANT
STAPLER SKIN PROX 35W (STAPLE) IMPLANT
SUCTION TUBE FRAZIER 10FR DISP (SUCTIONS) ×1 IMPLANT
SUT ETHILON 2 0 FS 18 (SUTURE) ×2 IMPLANT
SUT PDS AB 2-0 CT1 27 (SUTURE) IMPLANT
SUT VIC AB 2-0 CT1 TAPERPNT 27 (SUTURE) ×2 IMPLANT
TOWEL GREEN STERILE (TOWEL DISPOSABLE) ×2 IMPLANT
TOWEL GREEN STERILE FF (TOWEL DISPOSABLE) ×1 IMPLANT
TUBE CONNECTING 12X1/4 (SUCTIONS) ×1 IMPLANT
UNDERPAD 30X36 HEAVY ABSORB (UNDERPADS AND DIAPERS) ×1 IMPLANT
YANKAUER SUCT BULB TIP NO VENT (SUCTIONS) IMPLANT

## 2023-11-24 NOTE — Anesthesia Procedure Notes (Signed)
 Anesthesia Regional Block: Adductor canal block   Pre-Anesthetic Checklist: , timeout performed,  Correct Patient, Correct Site, Correct Laterality,  Correct Procedure, Correct Position, site marked,  Risks and benefits discussed,  Surgical consent,  Pre-op evaluation,  At surgeon's request and post-op pain management  Laterality: Right  Prep: chloraprep       Needles:  Injection technique: Single-shot  Needle Type: Echogenic Stimulator Needle     Needle Length: 10cm  Needle Gauge: 21   Needle insertion depth: 6 cm   Additional Needles:   Procedures:,,,, ultrasound used (permanent image in chart),,    Narrative:  Start time: 11/24/2023 7:58 AM End time: 11/24/2023 8:03 AM Injection made incrementally with aspirations every 5 mL.  Performed by: Personally  Anesthesiologist: Jerrye Sharper, MD  Additional Notes: Timeout performed. Patient sedated. Relevant anatomy ID'd using US . Incremental 2-5ml injection of LA with frequent aspiration. Patient tolerated procedure well.

## 2023-11-24 NOTE — Progress Notes (Signed)
 RN unable to reach handoff report to CRNA preprocedure via vocera.

## 2023-11-24 NOTE — Evaluation (Signed)
 Physical Therapy Re-Evaluation Patient Details Name: [name] MRN: [id number] DOB: 25-Jun-1950 Today's Date: 11/24/2023  History of Present Illness  [name] is a 73 y.o. female presents with a ground level fall, resulting in R ankle fracture and L ankle hyperplantarflexion injury; now s/p closed reduction in OR on 10/18. R ankle ORIF 10/28. PMH:  COPD (on 3L [place]), HTN, HLD, DM2, and hypothyroidism  Clinical Impression  Pt admitted with above diagnosis. Pt re-evaluated after R ankle ORIF this AM. Pt was able to take a few hops on LLE last session however today pt with increased L dorsal foot pain. Pt tender to touch dorsal L foot and has pain with resisted df. Pt reports she sustained a hyperdorsiflexion injury when she fell. Pt was able to put wt through LLE to scoot along EOB but was unable to stand on LLE more than 5 secs and was unable to pivot or hop. Requested brace for LLE to better support this side. Pt may also need sliding board for car transfers if she goes straight home. Would benefit from continued acute rehab <3 hrs/ day at outside facility but pt has thus far been against this. If this continues to be the case recommend HHPT. Goals updated.  Pt currently with functional limitations due to the deficits listed below (see PT Problem List). Pt will benefit from acute skilled PT to increase their independence and safety with mobility to allow discharge.           If plan is discharge home, recommend the following: A lot of help with bathing/dressing/bathroom;Assistance with cooking/housework;Assist for transportation;Help with stairs or ramp for entrance;Supervision due to cognitive status;Two people to help with walking and/or transfers   Can travel by private vehicle   No    Equipment Recommendations Other (comment) (sliding board)  Recommendations for Other Services  OT consult    Functional Status Assessment Patient has had a recent decline in their functional  status and/or demonstrates limited ability to make significant improvements in function in a reasonable and predictable amount of time     Precautions / Restrictions Precautions Precautions: Fall Recall of Precautions/Restrictions: Intact Restrictions Weight Bearing Restrictions Per Provider Order: Yes RLE Weight Bearing Per Provider Order: Non weight bearing      Mobility  Bed Mobility Overal bed mobility: Needs Assistance Bed Mobility: Supine to Sit, Sit to Supine     Supine to sit: Supervision, HOB elevated, Used rails Sit to supine: Supervision   General bed mobility comments: patient able to manage RLE for bed mobility    Transfers Overall transfer level: Needs assistance Equipment used: Rolling walker (2 wheels) Transfers: Sit to/from Stand Sit to Stand: From elevated surface, Mod assist           General transfer comment: was able to stand to LLE with therapist's foot supporting RLE to insure NWB, with min A. However, could tolerate only 5 secs due to L dorsal foot pain. Unable to pivot on L foot today    Ambulation/Gait               General Gait Details: unable to hop on LLE  Stairs            Wheelchair Mobility     Tilt Bed    Modified Rankin (Stroke Patients Only)       Balance Overall balance assessment: Needs assistance Sitting-balance support: Bilateral upper extremity supported, Feet supported Sitting balance-Leahy Scale: Good Sitting balance - Comments: on EOB  Standing balance support: Bilateral upper extremity supported Standing balance-Leahy Scale: Poor Standing balance comment: needs BUE support as well as mod A to stand                             Pertinent Vitals/Pain Pain Assessment Pain Assessment: 0-10 Faces Pain Scale: Hurts even more Pain Location: L foot > RLE Pain Descriptors / Indicators: Discomfort, Aching, Sore Pain Intervention(s): Limited activity within patient's tolerance, Monitored  during session, Premedicated before session    Home Living Family/patient expects to be discharged to:: Private residence Living Arrangements: Children Available Help at Discharge: Family;Available 24 hours/day Type of Home: House Home Access: Ramped entrance       Home Layout: One level Home Equipment: BSC/3in1;Shower seat;Hand held Stage Manager (4 wheels);Cane - single point;Wheelchair - manual Additional Comments: Pt reports that her daughter and grandson will assist her    Prior Function Prior Level of Function : Needs assist       Physical Assist : Mobility (physical);ADLs (physical) Mobility (physical): Transfers ADLs (physical): Bathing;Dressing;IADLs Mobility Comments: wheelchair when she goes out. transfers only in the house to/from [facility]. Spends most of her time in the bed. Eats meals in the bedroom as well. Able to walk to the bathroom for showers with hands on assist. ADLs Comments: Once a week son assists her to get in the tub/shower. Daughter assists to wash her back, she is able to do the rest. Typically uses the [facility] and is able to transfer independently.     Extremity/Trunk Assessment   Upper Extremity Assessment Upper Extremity Assessment: Overall WFL for tasks assessed    Lower Extremity Assessment Lower Extremity Assessment: RLE deficits/detail;LLE deficits/detail RLE Deficits / Details: ORIF R ankle this AM. Hip flex 3-/5, knee ext 2/5 due to nerve block. Full PROM at R knee. RLE: Unable to fully assess due to immobilization RLE Sensation: decreased light touch;decreased proprioception RLE Coordination: decreased gross motor LLE Deficits / Details: pt with tenderness dorsum of foot up to lower ankle, pain with resisted df. Able to df/pf without resistance without pain, ROM WFL. Hip and knee appear WFL LLE: Unable to fully assess due to pain LLE Sensation: WNL LLE Coordination: decreased gross motor    Cervical / Trunk Assessment Cervical / Trunk  Assessment: Normal  Communication   Communication Communication: No apparent difficulties    Cognition Arousal: Alert Behavior During Therapy: WFL for tasks assessed/performed   PT - Cognitive impairments: No apparent impairments                       PT - Cognition Comments: AxOx4 Following commands: Intact Following commands impaired: Follows multi-step commands with increased time     Cueing Cueing Techniques: Verbal cues     General Comments General comments (skin integrity, edema, etc.): VSS on 3L O2. Messaged MD about getting additional support for L ankle to be able to at least transfer surface to surface. Worked on lateral scooting along EOB putting partial wt through L foot. She was able to tolerate this    Exercises General Exercises - Lower Extremity Long Arc Quad: Seated, Both, AROM, 10 reps   Assessment/Plan    PT Assessment Patient needs continued PT services  PT Problem List Decreased strength;Decreased range of motion;Decreased activity tolerance;Decreased balance;Decreased mobility;Decreased coordination;Decreased cognition;Decreased knowledge of use of DME;Decreased safety awareness;Decreased knowledge of precautions;Cardiopulmonary status limiting activity;Pain       PT Treatment Interventions  DME instruction;Gait training;Stair training;Functional mobility training;Therapeutic activities;Therapeutic exercise;Balance training;Neuromuscular re-education;Cognitive remediation;Patient/family education;Wheelchair mobility training;Manual techniques    PT Goals (Current goals can be found in the Care Plan section)  Acute Rehab PT Goals Patient Stated Goal: Go Home PT Goal Formulation: With patient Time For Goal Achievement: 11/29/23 Potential to Achieve Goals: Fair    Frequency Min 2X/week     Co-evaluation               AM-PAC PT 6 Clicks Mobility  Outcome Measure Help needed turning from your back to your side while in a flat bed  without using bedrails?: A Little Help needed moving from lying on your back to sitting on the side of a flat bed without using bedrails?: A Little Help needed moving to and from a bed to a chair (including a wheelchair)?: A Little Help needed standing up from a chair using your arms (e.g., wheelchair or bedside chair)?: A Lot Help needed to walk in hospital room?: Total Help needed climbing 3-5 steps with a railing? : Total 6 Click Score: 13    End of Session Equipment Utilized During Treatment: Gait belt;Oxygen  Activity Tolerance: Patient limited by pain Patient left: in bed;with call bell/phone within reach;with bed alarm set Nurse Communication: Mobility status PT Visit Diagnosis: Pain;Other abnormalities of gait and mobility (R26.89);History of falling (Z91.81);Muscle weakness (generalized) (M62.81);Difficulty in walking, not elsewhere classified (R26.2) Pain - Right/Left: Right Pain - part of body: Leg    Time: 8494-8474 PT Time Calculation (min) (ACUTE ONLY): 20 min   Charges:   PT Evaluation $PT Re-evaluation: 1 Re-eval   PT General Charges $$ ACUTE PT VISIT: 1 Visit         Richerd Lipoma, PT  Acute Rehab Services Secure chat preferred Office [phone]   Richerd CROME Keita Demarco 11/24/2023, 4:27 PM

## 2023-11-24 NOTE — Progress Notes (Signed)
 Orthopedic Tech Progress Note Patient Details:  [name] 1950/02/03 [id number]  Ortho Devices Type of Ortho Device: Ankle Air splint Ortho Device/Splint Location: LLE Ortho Device/Splint Interventions: Ordered, Application, Adjustment  Fitted Air cast and left at bedside. Post Interventions Patient Tolerated: Well Instructions Provided: Adjustment of device  [name] [name] 11/24/2023, 7:57 PM

## 2023-11-24 NOTE — Progress Notes (Signed)
 No changes overnight.  The risks and benefits of right ankle repair were discussed with the patient and her family, including the possibility of infection, nerve injury, vessel injury, wound breakdown, arthritis, symptomatic hardware, DVT/ PE, loss of motion, malunion, nonunion, and need for further surgery among others. These risks were acknowledged and consent was provided to proceed.  Ozell Bruch, MD [facility], [facility] [phone]

## 2023-11-24 NOTE — Anesthesia Procedure Notes (Signed)
 Anesthesia Regional Block: Popliteal block   Pre-Anesthetic Checklist: , timeout performed,  Correct Patient, Correct Site, Correct Laterality,  Correct Procedure, Correct Position, site marked,  Risks and benefits discussed,  Surgical consent,  Pre-op evaluation,  At surgeon's request and post-op pain management  Laterality: Right  Prep: chloraprep       Needles:  Injection technique: Single-shot  Needle Type: Echogenic Stimulator Needle     Needle Length: 10cm  Needle Gauge: 21   Needle insertion depth: 6 cm   Additional Needles:   Procedures:,,,, ultrasound used (permanent image in chart),,   Motor weakness within 5 minutes.  Narrative:  Start time: 11/24/2023 7:30 AM End time: 11/24/2023 7:35 AM Injection made incrementally with aspirations every 5 mL.  Performed by: Personally  Anesthesiologist: Jerrye Sharper, MD  Additional Notes: Timeout performed. Patient sedated. Relevant anatomy ID'd using US . Incremental 2-5ml injection of LA with frequent aspiration. Patient tolerated procedure well.

## 2023-11-24 NOTE — Progress Notes (Signed)
  PROGRESS NOTE [name]  FMW:[id number] DOB: 1950-07-25 DOA: 11/14/2023 PCP: Rosamond Leta NOVAK, MD   Brief Narrative:  Patient is a 73 year old female with past medical history significant for COPD, coronary artery disease, nicotine  use, depression, type II diabetes, hypertension, myocardial infarction, chronic back pain, arthritis, chronic anxiety and postoperative nausea and vomiting.  Patient was admitted with right ankle fracture.  Status post surgical repair on 11/24/2023 by Dr. Celena.  Status post closed reduction 10/18.  11/24/2023: The patient was seen and examined at bedside.  Denies having any pain in her right ankle at the time of this visit.  She is postsurgical repair.  Assessment and Plan: R ankle fracture  Fragility fracture: -Status post closed reduction 11/14/2023.  -Status post surgical repair on 11/24/2023. -Continue pain control as needed and bowel regimen -PT/OT with orthopedic surgery's guidance   CAD s/p CABG HFpEF -PTA ASA 81mg  daily to begin 10/19, atorvastatin  80mg  daily, bisoprolol  5mg  daily, and lisinopril  20mg  daily No reported anginal symptoms.   COPD/chronic respiratory failure, on 3 L nasal cannula, at baseline. Stable.   HTN -PTA amlodipine  and lisinopril  Continue to closely monitor vital signs.   DM2 - SSI TID AC prn   Hypothyroid -PTA Synthroid 50 mcg daily  Tobacco abuse Smokes 1 pack/day Not interested in quitting   Time: 55 minutes.   DVT prophylaxis:     Code Status: Full Code Family Communication: none at bedside  Disposition Plan:  Level of care: Med-Surg Status is: Inpatient  Consultants:  Orthopedics    Objective: Vitals:   11/24/23 1230 11/24/23 1245 11/24/23 1323 11/24/23 1330  BP: (!) 149/40 (!) 149/46 (!) 128/54 (!) 128/54  Pulse: 68 70 80   Resp: 20 20 18    Temp:  98.1 F (36.7 C) 97.7 F (36.5 C)   TempSrc:   Oral   SpO2: 94% 95% 94%   Weight:      Height:        Intake/Output Summary (Last 24  hours) at 11/24/2023 1700 Last data filed at 11/24/2023 1300 Gross per 24 hour  Intake 800 ml  Output 600 ml  Net 200 ml    Filed Weights   11/14/23 1015 11/24/23 0701  Weight: 60.8 kg 60.8 kg    Examination: General: Well-developed and nourished in no acute distress. Cardiac: S1-S2.  Regular rate and rhythm no rubs or gallops. Respiratory: Clear to auscultation with no wheezes or rales. Abdomen: Soft and nontender bowel sounds present. Extremities: No lower extremity edema in left lower extremity.  Right lower extremity is in surgical dressing. Psych: Mood is appropriate for condition and setting.  Data Reviewed: I have personally reviewed following labs and imaging studies  CBC: Recent Labs  Lab 11/24/23 0252  WBC 8.8  HGB 8.9*  HCT 28.9*  MCV 88.9  PLT 571*   Basic Metabolic Panel: Recent Labs  Lab 11/21/23 0605 11/24/23 0252  NA  --  139  K  --  5.3*  CL  --  100  CO2  --  29  GLUCOSE  --  160*  BUN  --  15  CREATININE 1.12* 1.17*  CALCIUM   --  9.0    LOS: 10 days     Terry LOISE Hurst, MD [facility] Available via Epic secure chat 7am-7pm After these hours, please refer to coverage provider listed on [email or web address] 11/24/2023, 5:00 PM

## 2023-11-24 NOTE — Transfer of Care (Signed)
 Immediate Anesthesia Transfer of Care Note  Patient: [name]  Procedure(s) Performed: OPEN REDUCTION INTERNAL FIXATION (ORIF) ANKLE FRACTURE (Right: Ankle)  Patient Location: PACU  Anesthesia Type:MAC, Regional, and Spinal  Level of Consciousness: awake, alert , and oriented  Airway & Oxygen  Therapy: Patient Spontanous Breathing and Patient connected to nasal cannula oxygen   Post-op Assessment: Report given to RN and Post -op Vital signs reviewed and stable  Post vital signs: Reviewed and stable  Last Vitals:  Vitals Value Taken Time  BP 140/54 11/24/23 10:45  Temp 36.9 C 11/24/23 10:42  Pulse 59 11/24/23 10:58  Resp 18 11/24/23 10:58  SpO2 98 % 11/24/23 10:58  Vitals shown include unfiled device data.  Last Pain:  Vitals:   11/24/23 1042  TempSrc:   PainSc: 0-No pain      Patients Stated Pain Goal: 2 (11/22/23 0556)  Complications: No notable events documented.

## 2023-11-24 NOTE — Anesthesia Procedure Notes (Signed)
 Spinal  Patient location during procedure: OR Start time: 11/24/2023 8:33 AM End time: 11/24/2023 8:36 AM Reason for block: surgical anesthesia Staffing Performed: anesthesiologist  Anesthesiologist: Jerrye Sharper, MD Performed by: Jerrye Sharper, MD Authorized by: Jerrye Sharper, MD   Preanesthetic Checklist Completed: patient identified, IV checked, site marked, risks and benefits discussed, surgical consent, monitors and equipment checked, pre-op evaluation and timeout performed Spinal Block Patient position: sitting Prep: DuraPrep and site prepped and draped Patient monitoring: heart rate, cardiac monitor, continuous pulse ox and blood pressure Approach: midline Location: L3-4 Injection technique: single-shot Needle Needle type: Pencan  Needle gauge: 24 G Needle length: 9 cm Needle insertion depth: 6 cm Assessment Sensory level: T6 Events: CSF return Additional Notes Patient tolerated procedure well. Adequate sensory level.

## 2023-11-24 NOTE — Anesthesia Postprocedure Evaluation (Signed)
 Anesthesia Post Note  Patient: [name]  Procedure(s) Performed: OPEN REDUCTION INTERNAL FIXATION (ORIF) ANKLE FRACTURE (Right: Ankle)     Patient location during evaluation: PACU Anesthesia Type: Spinal and MAC Level of consciousness: awake and alert and oriented Pain management: pain level controlled Vital Signs Assessment: post-procedure vital signs reviewed and stable Respiratory status: spontaneous breathing, nonlabored ventilation, respiratory function stable and patient connected to nasal cannula oxygen  Cardiovascular status: stable and blood pressure returned to baseline Postop Assessment: no apparent nausea or vomiting, spinal receding, patient able to bend at knees, no headache and no backache Anesthetic complications: no   There were no known notable events for this encounter.  Last Vitals:  Vitals:   11/24/23 1045 11/24/23 1100  BP: (!) 140/54 (!) 146/51  Pulse: 61 60  Resp: 17 19  Temp:    SpO2: 96% 98%    Last Pain:  Vitals:   11/24/23 1100  TempSrc:   PainSc: 0-No pain      LLE Sensation: No sensation (absent) (11/24/23 1100)   RLE Sensation: No sensation (absent) (11/24/23 1100) L Sensory Level: T10-Umbilical region (11/24/23 1100) R Sensory Level: T10-Umbilical region (11/24/23 1100)  Taje Littler A.

## 2023-11-24 NOTE — Plan of Care (Signed)

## 2023-11-24 NOTE — Op Note (Signed)
 08/05/2021  10:28 AM  PATIENT:  [name]  06-13-1950 female   MEDICAL RECORD NUMBER: [id number]  PRE-OPERATIVE DIAGNOSIS:  RIGHT TRIMALLEOLAR ANKLE FRACTURE SUBLUXATION  POST-OPERATIVE DIAGNOSIS:  RIGHT TRIMALLEOLAR ANKLE FRACTURE SUBLUXATION  PROCEDURE:   OPEN REDUCTION INTERNAL FIXATION OF RIGHT TRIMALLEOLAR ANKLE FRACTURE WITH FIXATION OF THE POSTERIOR LIP OPEN TREATMENT OF ANKLE DISLOCATION WITH PINNING OF THE TIBIOTALAR (ANKLE) AND SUBTALAR JOINTS  MANUAL APPLICATION OF STRESS UNDER FLUOROSCOPY  SURGEON:  Ozell DEL. Celena, M.D.  ASSISTANT:  Francis Mt, PA-C.  ANESTHESIA:  Regional.  COMPLICATIONS:  None.  TOURNIQUET: None.  ESTIMATED BLOOD LOSS:  50 mL.  DISPOSITION:  To PACU.  CONDITION:  Stable.  DELAY START OF DVT PROPHYLAXIS BECAUSE OF BLEEDING RISK: NO   BRIEF SUMMARY AND INDICATIONS FOR PROCEDURE:  The patient is a 73 y.o. with profound osteoporosis and a prior contralateral ankle fracture who sustained a fracture dislocation of the right ankle, treated with unsuccessful attempted reduction at the time of presentation to the Emergency Department, followed by reduction under general anesthesia in the OR and splint application. Patient subsequently admitted to medicine for stabilization of her medical problems. She now presents for definitive repair. The risks and benefits of surgery were discussed with the patient and family including the possibility of infection, DVT, PE, nerve injury, vessel injury, loss of motion, arthritis, symptomatic hardware, heart attack, stroke and need for further surgery, among others. After acknowledging these risks, consent was provided to proceed.  SUMMARY OF PROCEDURE:  The patient was taken to the operating room after administration of a regional block and preoperative antibiotics.  Conscious sedation was administered.  The right lower extremity was prepped and draped in the usual sterile fashion. A timeout was held, and then an  incision was made directly over the lateral malleolus with careful dissection to avoid injury to the superficial peroneal nerve.  The periosteum was left intact as we continued deep dissection.  With the assistance of distal manipulation and  we were able to obtain a near anatomic reduction with interdigitation of the primary fracture fragments in spite of low quality bone. I was able to hold this interdigitated during application of a lateral malleolus plate which I pinned by placing K wires through the plate with my hands (rather than a driver) as if it were a cork board.  We used the Paragon system and placed standard first and then locked fixation in the shaft and distal lateral malleolus, confirming plate position with x-ray before continuing with locked fixation.  Final images showed appropriate reductional replacement, trajectory, and length.  Next, attention was turned to the medial side.  I attempted to place a pin through the medial malleolus but it just went directly through the bone which had insufficient integrity to allow for fixation.  I did attempt several passes with a K wire all of which met very little resistance.  Consequently I turned my attention from the medial malleolus to the posterior malleolus.    The posterior malleolar fracture fragment was then stabilized with a cannulated screw to secure it as an attachment site of the syndesmotic ligaments as its disruption and displacement contributed to instability as was visible at the time of initial injury. The guide pin for a cannulated screw was placed using fluoroscopic guidance from anteromedial to posterolateral in the subchondral bone, overdrilling the near side. This produced compression, and the screw appeared to be of appropriate length and trajectory on multiple images.   I then performed an external  rotation stress view of the ankle under live fluoroscopy.  No syndesmotic widening nor widening of the medial clear space was  identified to suggest a syndesmotic injury.  Wounds were irrigated once more and then closed in standard layered fashion using 2-0 Vicryl and 3-0 nylon.  A sterile gently compressive dressing was applied, and then a posterior and stirrup splint with the ankle extended just above neutral.  For stabilization of the ankle dislocation we then placed 2 parallel 2.4 mm Steinmann pins through the calcaneus across the talus and then across the tibia checking their position on AP lateral and mortise views.  The wounds were irrigated thoroughly and then closed in standard fashion.  A posterior and stirrup splint were then applied.   PROGNOSIS: The patient will be nonweightbearing in the splint with ice and elevation over the next 3 to 5 days.  We will plan to see her back in the office in 10-14 days for removal of sutures and transition to a Cam boot with unrestricted range of motion of the ankle at that time.  Weightbearing at 6 weeks.  Probable removal of the pins at 4 weeks from surgery.

## 2023-11-25 ENCOUNTER — Encounter (HOSPITAL_COMMUNITY): Payer: Self-pay | Admitting: Orthopedic Surgery

## 2023-11-25 ENCOUNTER — Inpatient Hospital Stay (HOSPITAL_COMMUNITY)

## 2023-11-25 DIAGNOSIS — S9304XA Dislocation of right ankle joint, initial encounter: Secondary | ICD-10-CM | POA: Insufficient documentation

## 2023-11-25 DIAGNOSIS — M810 Age-related osteoporosis without current pathological fracture: Secondary | ICD-10-CM | POA: Insufficient documentation

## 2023-11-25 DIAGNOSIS — E875 Hyperkalemia: Secondary | ICD-10-CM

## 2023-11-25 DIAGNOSIS — S82851A Displaced trimalleolar fracture of right lower leg, initial encounter for closed fracture: Secondary | ICD-10-CM | POA: Diagnosis present

## 2023-11-25 DIAGNOSIS — E559 Vitamin D deficiency, unspecified: Secondary | ICD-10-CM | POA: Insufficient documentation

## 2023-11-25 LAB — GLUCOSE, CAPILLARY
Glucose-Capillary: 173 mg/dL — ABNORMAL HIGH (ref 70–99)
Glucose-Capillary: 120 mg/dL — ABNORMAL HIGH (ref 70–99)
Glucose-Capillary: 204 mg/dL — ABNORMAL HIGH (ref 70–99)
Glucose-Capillary: 112 mg/dL — ABNORMAL HIGH (ref 70–99)

## 2023-11-25 LAB — BASIC METABOLIC PANEL WITH GFR
Anion gap: 9 (ref 5–15)
BUN: 15 mg/dL (ref 8–23)
CO2: 27 mmol/L (ref 22–32)
Calcium: 8.6 mg/dL — ABNORMAL LOW (ref 8.9–10.3)
Chloride: 99 mmol/L (ref 98–111)
Creatinine, Ser: 1.25 mg/dL — ABNORMAL HIGH (ref 0.44–1.00)
GFR, Estimated: 46 mL/min — ABNORMAL LOW (ref >60)
Glucose, Bld: 150 mg/dL — ABNORMAL HIGH (ref 70–99)
Potassium: 5.6 mmol/L — ABNORMAL HIGH (ref 3.5–5.1)
Sodium: 135 mmol/L (ref 135–145)

## 2023-11-25 LAB — CBC
HCT: 26.4 % — ABNORMAL LOW (ref 36.0–46.0)
Hemoglobin: 8.3 g/dL — ABNORMAL LOW (ref 12.0–15.0)
MCH: 27.9 pg (ref 26.0–34.0)
MCHC: 31.4 g/dL (ref 30.0–36.0)
MCV: 88.6 fL (ref 80.0–100.0)
Platelets: 539 K/uL — ABNORMAL HIGH (ref 150–400)
RBC: 2.98 MIL/uL — ABNORMAL LOW (ref 3.87–5.11)
RDW: 17 % — ABNORMAL HIGH (ref 11.5–15.5)
WBC: 12.1 K/uL — ABNORMAL HIGH (ref 4.0–10.5)
nRBC: 0 % (ref 0.0–0.2)

## 2023-11-25 LAB — MAGNESIUM: Magnesium: 1.7 mg/dL (ref 1.7–2.4)

## 2023-11-25 MED ORDER — HYDROCODONE-ACETAMINOPHEN 10-325 MG PO TABS
1.0000 | ORAL_TABLET | ORAL | Status: DC | PRN
  Administered 2023-11-25: 1 via ORAL
  Filled 2023-11-25: qty 1

## 2023-11-25 MED ORDER — SODIUM ZIRCONIUM CYCLOSILICATE 10 G PO PACK
10.0000 g | PACK | Freq: Two times a day (BID) | ORAL | Status: DC
  Administered 2023-11-25 (×2): 10 g via ORAL
  Filled 2023-11-25 (×2): qty 1

## 2023-11-25 MED ORDER — HYDROMORPHONE HCL 1 MG/ML IJ SOLN
0.5000 mg | INTRAMUSCULAR | Status: DC | PRN
  Administered 2023-11-25 – 2023-11-26 (×2): 0.5 mg via INTRAVENOUS
  Filled 2023-11-25 (×2): qty 0.5

## 2023-11-25 MED ORDER — HYDROCODONE-ACETAMINOPHEN 10-325 MG PO TABS
1.0000 | ORAL_TABLET | ORAL | Status: DC | PRN
Start: 2023-11-25 — End: 2023-11-26
  Administered 2023-11-25 – 2023-11-26 (×5): 2 via ORAL
  Filled 2023-11-25 (×5): qty 2

## 2023-11-25 NOTE — Progress Notes (Signed)
 PROGRESS NOTE VERSA CRATON  FMW:[id number] DOB: [birth year] DOA: 11/14/2023 PCP: Rosamond Leta NOVAK, MD   Brief Narrative:  Patient is a 73 year old female with past medical history significant for COPD, coronary artery disease, nicotine  use, depression, type II diabetes, hypertension, myocardial infarction, chronic back pain, arthritis, chronic anxiety and postoperative nausea and vomiting.  Patient was admitted with right ankle fracture.  Status post surgical repair on 11/24/2023 by Dr. Celena.  Status post closed reduction 10/18. Went back to the OR on 10/28 for right trimalleolar ankle fracture subluxation repair with pinning of the ankle joint  Assessment and Plan: R ankle fracture  Fragility fracture: -Status post closed reduction 11/14/2023.  -Status post surgical repair on 11/24/2023. -Continue pain control as needed and bowel regimen -PT/OT-family would like to take home-only DME equipment they need currently is a walker-spoke with son who knows she is nonweightbearing on the right foot - Will need orthopedic follow-up for removal of pins   CAD s/p CABG HFpEF -PTA ASA 81mg  daily to begin 10/19, atorvastatin  80mg  daily, bisoprolol  5mg  daily No reported anginal symptoms.   COPD/chronic respiratory failure, on 3 L nasal cannula, at baseline. Stable.   HTN -PTA amlodipine  and lisinopril  Continue to closely monitor vital signs.   DM2 - SSI TID AC prn   Hypothyroid -PTA Synthroid 50 mcg daily  Tobacco abuse Smokes 1 pack/day Not interested in quitting  Hyperkalemia - Will stop lisinopril  and add Lokelma x 2 with recheck of BMP in the a.m.    DVT prophylaxis:     Code Status: Full Code Family Communication: Son  Disposition Plan:  Level of care: Med-Surg Status is: Inpatient  Consultants:  Orthopedics    Objective: Vitals:   11/24/23 2057 11/25/23 0144 11/25/23 0510 11/25/23 0854  BP: (!) 121/40 (!) 130/44 (!) 140/51 (!) 154/46  Pulse: 69 68 74 68  Resp:     18  Temp: 98.2 F (36.8 C) 98.2 F (36.8 C) 98.6 F (37 C) 98 F (36.7 C)  TempSrc: Oral Oral Oral Oral  SpO2: 98% 92% 96% 95%  Weight:      Height:        Intake/Output Summary (Last 24 hours) at 11/25/2023 1045 Last data filed at 11/24/2023 1800 Gross per 24 hour  Intake 340 ml  Output 300 ml  Net 40 ml    Filed Weights   11/14/23 1015 11/24/23 0701  Weight: 60.8 kg 60.8 kg    Examination:  General: Appearance:    Well developed, well nourished female in no acute distress     Lungs:     respirations unlabored  Heart:    Normal heart rate. Normal rhythm. No murmurs, rubs, or gallops.   MS:   All extremities are intact.   Neurologic:   Awake, alert    Data Reviewed: I have personally reviewed following labs and imaging studies  CBC: Recent Labs  Lab 11/24/23 0252 11/25/23 0500  WBC 8.8 12.1*  HGB 8.9* 8.3*  HCT 28.9* 26.4*  MCV 88.9 88.6  PLT 571* 539*   Basic Metabolic Panel: Recent Labs  Lab 11/21/23 0605 11/24/23 0252 11/25/23 0500  NA  --  139 135  K  --  5.3* 5.6*  CL  --  100 99  CO2  --  29 27  GLUCOSE  --  160* 150*  BUN  --  15 15  CREATININE 1.12* 1.17* 1.25*  CALCIUM   --  9.0 8.6*  MG  --   --  1.7    LOS: 11 days     Zaquan Duffner U Yoona Ishii, DO [facility] Available via Epic secure chat 7am-7pm After these hours, please refer to coverage provider listed on [email or web address] 11/25/2023, 10:45 AM

## 2023-11-25 NOTE — Plan of Care (Signed)
  Problem: Education: Goal: Knowledge of General Education information will improve Description: Including pain rating scale, medication(s)/side effects and non-pharmacologic comfort measures Outcome: Progressing   Problem: Clinical Measurements: Goal: Will remain free from infection Outcome: Progressing   Problem: Nutrition: Goal: Adequate nutrition will be maintained Outcome: Progressing   Problem: Coping: Goal: Level of anxiety will decrease Outcome: Progressing   Problem: Pain Managment: Goal: General experience of comfort will improve and/or be controlled Outcome: Progressing   Problem: Safety: Goal: Ability to remain free from injury will improve Outcome: Progressing

## 2023-11-25 NOTE — Progress Notes (Addendum)
 Orthopaedic Trauma Service Progress Note  Patient ID: [name] MRN: [id number] DOB/AGE: 73/04/1950 73 y.o.  Subjective:  Doing okay Nerve block to right leg has worn off We reviewed the intraoperative findings as well as what was done  Patient still pretty set on discharging home and not wanting to go to a SNF  She lives with her daughter also states that her grandson is home at night as he lives with him and her son is around regularly  Was using a cane prior to her fall and was getting around reasonably well  States that she has been on opioid medications for her back for over 30 years.  Has been on Norco 7.5 at least 10 years and it was only in October that her frequency was increased to 4 times a day   ROS As above  Today's  total administered Morphine  Milligram Equivalents: 30 Yesterday's total administered Morphine  Milligram Equivalents: 80  Objective:   VITALS:   Vitals:   11/24/23 2057 11/25/23 0144 11/25/23 0510 11/25/23 0854  BP: (!) 121/40 (!) 130/44 (!) 140/51 (!) 154/46  Pulse: 69 68 74 68  Resp:    18  Temp: 98.2 F (36.8 C) 98.2 F (36.8 C) 98.6 F (37 C) 98 F (36.7 C)  TempSrc: Oral Oral Oral Oral  SpO2: 98% 92% 96% 95%  Weight:      Height:        Estimated body mass index is 23.01 kg/m as calculated from the following:   Height as of this encounter: 5' 4 (1.626 m).   Weight as of this encounter: 60.8 kg.   Intake/Output      10/28 0701 10/29 0700 10/29 0701 10/30 0700   P.O. 240    I.V. (mL/kg) 700 (11.5)    IV Piggyback 200    Total Intake(mL/kg) 1140 (18.8)    Urine (mL/kg/hr) 300 (0.2)    Stool 0    Blood 50    Total Output 350    Net +790         Urine Occurrence 1 x    Stool Occurrence 1 x      LABS  Results for orders placed or performed during the hospital encounter of 11/14/23 (from the past 24 hours)  Glucose, capillary     Status:  Abnormal   Collection Time: 11/24/23 10:42 AM  Result Value Ref Range   Glucose-Capillary 129 (H) 70 - 99 mg/dL  Glucose, capillary     Status: Abnormal   Collection Time: 11/24/23  1:21 PM  Result Value Ref Range   Glucose-Capillary 161 (H) 70 - 99 mg/dL  Glucose, capillary     Status: Abnormal   Collection Time: 11/24/23  6:18 PM  Result Value Ref Range   Glucose-Capillary 181 (H) 70 - 99 mg/dL  Glucose, capillary     Status: None   Collection Time: 11/24/23  8:58 PM  Result Value Ref Range   Glucose-Capillary 87 70 - 99 mg/dL  CBC     Status: Abnormal   Collection Time: 11/25/23  5:00 AM  Result Value Ref Range   WBC 12.1 (H) 4.0 - 10.5 K/uL   RBC 2.98 (L) 3.87 - 5.11 MIL/uL   Hemoglobin 8.3 (L) 12.0 - 15.0 g/dL   HCT 73.5 (L) 63.9 - 53.9 %  MCV 88.6 80.0 - 100.0 fL   MCH 27.9 26.0 - 34.0 pg   MCHC 31.4 30.0 - 36.0 g/dL   RDW 82.9 (H) 88.4 - 84.4 %   Platelets 539 (H) 150 - 400 K/uL   nRBC 0.0 0.0 - 0.2 %  Basic metabolic panel     Status: Abnormal   Collection Time: 11/25/23  5:00 AM  Result Value Ref Range   Sodium 135 135 - 145 mmol/L   Potassium 5.6 (H) 3.5 - 5.1 mmol/L   Chloride 99 98 - 111 mmol/L   CO2 27 22 - 32 mmol/L   Glucose, Bld 150 (H) 70 - 99 mg/dL   BUN 15 8 - 23 mg/dL   Creatinine, Ser 8.74 (H) 0.44 - 1.00 mg/dL   Calcium  8.6 (L) 8.9 - 10.3 mg/dL   GFR, Estimated 46 (L) >60 mL/min   Anion gap 9 5 - 15  Magnesium      Status: None   Collection Time: 11/25/23  5:00 AM  Result Value Ref Range   Magnesium  1.7 1.7 - 2.4 mg/dL  Glucose, capillary     Status: Abnormal   Collection Time: 11/25/23  7:47 AM  Result Value Ref Range   Glucose-Capillary 173 (H) 70 - 99 mg/dL   Comment 1 Notify RN      PHYSICAL EXAM:   Gen: Awake and alert, no acute distress, interactive and pleasant  Lungs: Unlabored Ext:       Right lower extremity             SLS is fitting well             Ext warm             Motor and sensory functions grossly intact              + DP pulse   No pain out of proportion with passive stretching of her toes  Assessment/Plan: 1 Day Post-Op   Principal Problem:   Closed right ankle fracture Active Problems:   Closed displaced trimalleolar fracture of right ankle   Ankle dislocation, right, initial encounter   Osteoporosis   Vitamin D  deficiency   Anti-infectives (From admission, onward)    Start     Dose/Rate Route Frequency Ordered Stop   11/24/23 1630  ceFAZolin (ANCEF) IVPB 2g/100 mL premix        2 g 200 mL/hr over 30 Minutes Intravenous Every 8 hours 11/24/23 1301 11/25/23 0542   11/24/23 0600  ceFAZolin (ANCEF) IVPB 2g/100 mL premix        2 g 200 mL/hr over 30 Minutes Intravenous On call to O.R. 11/23/23 9145 11/24/23 0908   11/14/23 1200  ceFAZolin (ANCEF) IVPB 2g/100 mL premix        2 g 200 mL/hr over 30 Minutes Intravenous On call to O.R. 11/14/23 1008 11/15/23 0559   11/14/23 0600  cefTRIAXone (ROCEPHIN) 1 g in sodium chloride  0.9 % 100 mL IVPB        1 g 200 mL/hr over 30 Minutes Intravenous  Once 11/14/23 0552 11/14/23 0724     .  POD/HD#: 34  73 year old female ground-level fall with right trimalleolar ankle fracture dislocation   -fall   - Right trimalleolar ankle fracture dislocation s/p open reduction internal fixation of fibula, percutaneous fixation of posterior malleolus, closed reduction medial malleolus, augmentation of fixation with tibiotaloalcaneal pins x 2              Nonweightbearing right  lower extremity for 6 to 8 weeks  Will likely leave splint on for 3 weeks and then remove her tibiotalocalcaneal pins  May place her in a short leg cast after this versus removable cam boot.  This depends on how much healing is present at the time of pin removal.  Her bone quality is very poor   Continue with aggressive ice and elevation   Therapy evals.  No mobility restrictions other than nonweightbearing right leg   - L ankle soreness, ecchymosis             X-rays are  negative             Aircast during mobility exercises  - Pain management:             Multimodal, minimize narcotics   On hydrocodone  chronically   Will need some mild increases in her medications to manage her acute surgical  and fracture pain     Will change her Norco to the following as she is on 7.5/325 every 6 hours at home:      Norco 10/325 every 4 hours as needed for moderate to severe pain     Patient has some mild elevations in her creatinine, therefore will not add any ketorolac     Dilaudid 0.5 mg IV every 3 hours as needed for severe breakthrough pain   - DVT/PE prophylaxis:             Lovenox              Will put on Eliquis for 30 days postoperatively for DVT prophylaxis   - Metabolic Bone Disease:             Vitamin D  deficiency                         Supplement   - Dispo:             Ortho issues addressed  TOC consult for DME needs  PT and OT evaluations  Suspect she will be ready for discharge Friday or Saturday to home  Francis MICAEL Mt, PA-C [phone] (C) 11/25/2023, 9:30 AM  [facility] [address] [place] [address] [phone] [phone] (F)    After 5pm and on the weekends please log on to Amion, go to orthopaedics and the look under the [facility] Call for the provider(s) on call. You can also call our office at [phone] and then follow the prompts to be connected to the call team.  Patient ID: [name], female   DOB: 09-15-1950, 73 y.o.   MRN: [id number]

## 2023-11-25 NOTE — Progress Notes (Signed)
 Occupational Therapy Treatment Patient Details Name: [name] MRN: [id number] DOB: 1950/05/28 Today's Date: 11/25/2023   History of present illness [name] is a 73 y.o. female presents with a ground level fall, resulting in R ankle fracture and L ankle hyperplantarflexion injury; now s/p closed reduction in OR on 10/18. R ankle ORIF 10/28. PMH:  COPD (on 3L [place]), HTN, HLD, DM2, and hypothyroidism   OT comments  Pt is progressing towards established goals. Pt refusing SNF referral, preference to return home and agreeable to [facility] services. D/c recommendations updated. Focus of session on increasing functional mobility and independence with ADL tasks. Pt required up to Mod A for sliding board transfer to recliner and one sit to stand attempt while maintaining NWB precautions. Pt continues to require up to Max A for LB ADLs. Pt will continue to benefit from skilled OT services in acute and post acute care to facilitate progress towards goals and maximize functional abilities. OT to recommend HHOT.       If plan is discharge home, recommend the following:  Two people to help with walking and/or transfers;A lot of help with bathing/dressing/bathroom;Assistance with cooking/housework;Assist for transportation;Help with stairs or ramp for entrance   Equipment Recommendations  BSC/3in1;Other (comment) (drop arm BSC and RW)    Recommendations for Other Services      Precautions / Restrictions Precautions Precautions: Fall Recall of Precautions/Restrictions: Intact Precaution/Restrictions Comments: Cues to adhere to weight-bearing precautions throughout functional mobility. Restrictions Weight Bearing Restrictions Per Provider Order: Yes RLE Weight Bearing Per Provider Order: Non weight bearing       Mobility Bed Mobility Overal bed mobility: Needs Assistance Bed Mobility: Supine to Sit     Supine to sit: Supervision, HOB elevated, Used rails     General bed mobility  comments: Pt able to manage RLE for bed mobility, increased time needed    Transfers Overall transfer level: Needs assistance Equipment used: Sliding board Transfers: Bed to chair/wheelchair/BSC, Sit to/from Stand Sit to Stand: Mod assist          Lateral/Scoot Transfers: Mod assist, With slide board General transfer comment: Pt required Mod A to sequence steps for sliding board transfer as well as verbal cues to weight shift. Pt with increased fatigue during trasnfer, requiring interval recovery break. One sit to stand transfer with Mod A from recliner.     Balance Overall balance assessment: Needs assistance Sitting-balance support: Bilateral upper extremity supported, Feet supported Sitting balance-Leahy Scale: Good Sitting balance - Comments: on EOB   Standing balance support: Bilateral upper extremity supported, During functional activity, Reliant on assistive device for balance Standing balance-Leahy Scale: Poor Standing balance comment: needs BUE support as well as mod A to stand, unable to stand to maintain WB precautions                           ADL either performed or assessed with clinical judgement   ADL   Eating/Feeding: Set up;Sitting           Lower Body Bathing: Maximal assistance;Sitting/lateral leans       Lower Body Dressing: Maximal assistance;Sit to/from stand   Toilet Transfer: Moderate assistance;+2 for safety/equipment;Transfer board Toilet Transfer Details (indicate cue type and reason): utilized trasnfer board for transfer to [employer] Executive and Hygiene: Total assistance Toileting - Clothing Manipulation Details (indicate cue type and reason): Purewick            Extremity/Trunk Assessment Upper  Extremity Assessment Upper Extremity Assessment: Generalized weakness            Vision   Vision Assessment?: No apparent visual deficits   Perception     Praxis     Communication  Communication Communication: No apparent difficulties   Cognition Arousal: Alert Behavior During Therapy: WFL for tasks assessed/performed Cognition: No apparent impairments                               Following commands: Intact Following commands impaired: Follows multi-step commands with increased time      Cueing   Cueing Techniques: Verbal cues, Tactile cues, Visual cues  Exercises Exercises: General Upper Extremity General Exercises - Upper Extremity Shoulder Flexion: Strengthening, Both, 10 reps, Supine, Theraband Theraband Level (Shoulder Flexion): Level 2 (Red) Shoulder Horizontal ABduction: Strengthening, Both, 10 reps, Supine, Theraband Theraband Level (Shoulder Horizontal Abduction): Level 2 (Red) Elbow Flexion: Strengthening, Both, 10 reps, Theraband Theraband Level (Elbow Flexion): Level 2 (Red)    Shoulder Instructions       General Comments VSS, O2 97% following sliding board transfer. Pt informed OT that she will not be open to SNF. OT provided Pt education on HHOT services and Pt agreeable.    Pertinent Vitals/ Pain       Pain Assessment Pain Assessment: PAINAD Breathing: occasional labored breathing, short period of hyperventilation Negative Vocalization: occasional moan/groan, low speech, negative/disapproving quality Facial Expression: smiling or inexpressive Body Language: relaxed Consolability: no need to console PAINAD Score: 2 Pain Location: RLE Pain Descriptors / Indicators: Discomfort, Grimacing, Guarding Pain Intervention(s): Premedicated before session  Home Living                                          Prior Functioning/Environment              Frequency  Min 2X/week        Progress Toward Goals  OT Goals(current goals can now be found in the care plan section)  Progress towards OT goals: Progressing toward goals  Acute Rehab OT Goals Patient Stated Goal: to go home OT Goal Formulation:  With patient Time For Goal Achievement: 11/30/23 Potential to Achieve Goals: Good ADL Goals Pt Will Perform Lower Body Bathing: with min assist;with adaptive equipment;sitting/lateral leans Pt Will Perform Lower Body Dressing: with min assist;with adaptive equipment;sitting/lateral leans Pt Will Transfer to Toilet: with contact guard assist;bedside commode;anterior/posterior transfer Pt Will Perform Toileting - Clothing Manipulation and hygiene: with mod assist;sitting/lateral leans  Plan      Co-evaluation                 AM-PAC OT 6 Clicks Daily Activity     Outcome Measure   Help from another person eating meals?: A Little Help from another person taking care of personal grooming?: A Little Help from another person toileting, which includes using toliet, bedpan, or urinal?: Total Help from another person bathing (including washing, rinsing, drying)?: A Lot Help from another person to put on and taking off regular upper body clothing?: A Little Help from another person to put on and taking off regular lower body clothing?: A Lot 6 Click Score: 14    End of Session Equipment Utilized During Treatment: Rolling walker (2 wheels);Oxygen ;Other (comment) (sliding board)  OT Visit Diagnosis: Other abnormalities of gait and mobility (R26.89);Muscle weakness (generalized) (  M62.81);Pain Pain - Right/Left: Right Pain - part of body: Leg   Activity Tolerance Patient limited by pain   Patient Left in chair;with call bell/phone within reach;with chair alarm set   Nurse Communication Mobility status;Other (comment) (need for new purewick)        Time: 1208-1232 OT Time Calculation (min): 24 min  Charges: OT General Charges $OT Visit: 1 Visit OT Treatments $Therapeutic Activity: 23-37 mins  Maurilio CROME, OTR/L.  [facility]  Office: [phone]   Maurilio PARAS Beauty Pless 11/25/2023, 1:42 PM

## 2023-11-26 ENCOUNTER — Other Ambulatory Visit (HOSPITAL_COMMUNITY): Payer: Self-pay

## 2023-11-26 DIAGNOSIS — E875 Hyperkalemia: Secondary | ICD-10-CM | POA: Diagnosis not present

## 2023-11-26 DIAGNOSIS — S82891A Other fracture of right lower leg, initial encounter for closed fracture: Secondary | ICD-10-CM | POA: Diagnosis not present

## 2023-11-26 DIAGNOSIS — W19XXXA Unspecified fall, initial encounter: Secondary | ICD-10-CM | POA: Diagnosis not present

## 2023-11-26 LAB — BASIC METABOLIC PANEL WITH GFR
Anion gap: 12 (ref 5–15)
BUN: 18 mg/dL (ref 8–23)
CO2: 24 mmol/L (ref 22–32)
Calcium: 8.2 mg/dL — ABNORMAL LOW (ref 8.9–10.3)
Chloride: 100 mmol/L (ref 98–111)
Creatinine, Ser: 1.42 mg/dL — ABNORMAL HIGH (ref 0.44–1.00)
GFR, Estimated: 39 mL/min — ABNORMAL LOW (ref >60)
Glucose, Bld: 143 mg/dL — ABNORMAL HIGH (ref 70–99)
Potassium: 4.4 mmol/L (ref 3.5–5.1)
Sodium: 136 mmol/L (ref 135–145)

## 2023-11-26 LAB — GLUCOSE, CAPILLARY
Glucose-Capillary: 193 mg/dL — ABNORMAL HIGH (ref 70–99)
Glucose-Capillary: 95 mg/dL (ref 70–99)

## 2023-11-26 MED ORDER — HYDROCODONE-ACETAMINOPHEN 10-325 MG PO TABS
1.0000 | ORAL_TABLET | ORAL | 0 refills | Status: AC | PRN
  Filled 2023-11-26: qty 22, 4d supply, fill #0

## 2023-11-26 MED ORDER — VITAMIN D (ERGOCALCIFEROL) 1.25 MG (50000 UNIT) PO CAPS
50000.0000 [IU] | ORAL_CAPSULE | ORAL | 0 refills | Status: AC
  Filled 2023-11-26: qty 5, 35d supply, fill #0

## 2023-11-26 MED ORDER — APIXABAN 2.5 MG PO TABS
2.5000 mg | ORAL_TABLET | Freq: Two times a day (BID) | ORAL | 0 refills | Status: AC
  Filled 2023-11-26: qty 60, 30d supply, fill #0

## 2023-11-26 MED ORDER — POLYETHYLENE GLYCOL 3350 17 G PO PACK: 17.0000 g | PACK | Freq: Every day | ORAL | Status: AC | PRN

## 2023-11-26 MED ORDER — SENNOSIDES-DOCUSATE SODIUM 8.6-50 MG PO TABS: 1.0000 | ORAL_TABLET | Freq: Every day | ORAL | Status: AC

## 2023-11-26 MED ORDER — POLYSACCHARIDE IRON COMPLEX 150 MG PO CAPS
150.0000 mg | ORAL_CAPSULE | Freq: Every day | ORAL | Status: DC
  Administered 2023-11-26: 150 mg via ORAL
  Filled 2023-11-26: qty 1

## 2023-11-26 MED ORDER — POLYSACCHARIDE IRON COMPLEX 150 MG PO CAPS
150.0000 mg | ORAL_CAPSULE | Freq: Every day | ORAL | 0 refills | Status: AC
  Filled 2023-11-26: qty 30, 30d supply, fill #0

## 2023-11-26 MED ORDER — METHOCARBAMOL 500 MG PO TABS
500.0000 mg | ORAL_TABLET | Freq: Three times a day (TID) | ORAL | 0 refills | Status: AC | PRN
  Filled 2023-11-26: qty 20, 7d supply, fill #0

## 2023-11-26 NOTE — Discharge Instructions (Addendum)
 Orthopaedic Trauma Service Discharge Instructions   General Discharge Instructions  Orthopaedic Injuries:  Right ankle fracture treated with open reduction internal fixation using plate and screws  WEIGHT BEARING STATUS: Nonweightbearing right leg for 6 to 8 weeks.  Use walker or wheelchair to mobilize  RANGE OF MOTION/ACTIVITY: No ankle motion as you are currently splinted.  Activity as tolerated while maintaining weightbearing restrictions  Bone health: Labs show vitamin D  deficiency.  Recommend supplementing with vitamin D3 5000 IUs daily  Review the following resource for additional information regarding bone health  [email or web address]  Wound Care: Leave splint in place until your first office follow-up  DVT/PE prophylaxis: Eliquis 2.5 mg every 12 hours for 30 days for blood clot prevention  Diet: as you were eating previously.  Can use over the counter stool softeners and bowel preparations, such as Miralax, to help with bowel movements.  Narcotics can be constipating.  Be sure to drink plenty of fluids  PAIN MEDICATION USE AND EXPECTATIONS  You have likely been given narcotic medications to help control your pain.  After a traumatic event that results in an fracture (broken bone) with or without surgery, it is ok to use narcotic pain medications to help control one's pain.  We understand that everyone responds to pain differently and each individual patient will be evaluated on a regular basis for the continued need for narcotic medications. Ideally, narcotic medication use should last no more than 6-8 weeks (coinciding with fracture healing).   As a patient it is your responsibility as well to monitor narcotic medication use and report the amount and frequency you use these medications when you come to your office visit.   We would also advise that if you are using narcotic medications, you should take a dose prior to therapy to maximize you  participation.  IF YOU ARE ON NARCOTIC MEDICATIONS IT IS NOT PERMISSIBLE TO OPERATE A MOTOR VEHICLE (MOTORCYCLE/CAR/TRUCK/MOPED) OR HEAVY MACHINERY DO NOT MIX NARCOTICS WITH OTHER CNS (CENTRAL NERVOUS SYSTEM) DEPRESSANTS SUCH AS ALCOHOL    POST-OPERATIVE OPIOID TAPER INSTRUCTIONS: It is important to wean off of your opioid medication as soon as possible. If you do not need pain medication after your surgery it is ok to stop day one. Opioids include: Codeine, Hydrocodone (Norco, Vicodin), Oxycodone (Percocet, oxycontin ) and hydromorphone amongst others.  Long term and even short term use of opiods can cause: Increased pain response Dependence Constipation Depression Respiratory depression And more.  Withdrawal symptoms can include Flu like symptoms Nausea, vomiting And more Techniques to manage these symptoms Hydrate well Eat regular healthy meals Stay active Use relaxation techniques(deep breathing, meditating, yoga) Do Not substitute Alcohol  to help with tapering If you have been on opioids for less than two weeks and do not have pain than it is ok to stop all together.  Plan to wean off of opioids This plan should start within one week post op of your fracture surgery  Maintain the same interval or time between taking each dose and first decrease the dose.  Cut the total daily intake of opioids by one tablet each day Next start to increase the time between doses. The last dose that should be eliminated is the evening dose.    STOP SMOKING OR USING NICOTINE  PRODUCTS!!!!  As discussed nicotine  severely impairs your body's ability to heal surgical and traumatic wounds but also impairs bone healing.  Wounds and bone heal by forming microscopic blood vessels (angiogenesis) and nicotine  is a vasoconstrictor (essentially, shrinks blood vessels).  Therefore, if  vasoconstriction occurs to these microscopic blood vessels they essentially disappear and are unable to deliver necessary  nutrients to the healing tissue.  This is one modifiable factor that you can do to dramatically increase your chances of healing your injury.    (This means no smoking, no nicotine  gum, patches, etc)  DO NOT USE NONSTEROIDAL ANTI-INFLAMMATORY DRUGS (NSAID'S)  Using products such as Advil  (ibuprofen ), Aleve (naproxen), Motrin  (ibuprofen ) for additional pain control during fracture healing can delay and/or prevent the healing response.  If you would like to take over the counter (OTC) medication, Tylenol  (acetaminophen ) is ok.  However, some narcotic medications that are given for pain control contain acetaminophen  as well. Therefore, you should not exceed more than 4000 mg of tylenol  in a day if you do not have liver disease.  Also note that there are may OTC medicines, such as cold medicines and allergy medicines that my contain tylenol  as well.  If you have any questions about medications and/or interactions please ask your doctor/PA or your pharmacist.      ICE AND ELEVATE INJURED/OPERATIVE EXTREMITY  Using ice and elevating the injured extremity above your heart can help with swelling and pain control.  Icing in a pulsatile fashion, such as 20 minutes on and 20 minutes off, can be followed.    Do not place ice directly on skin. Make sure there is a barrier between to skin and the ice pack.    Using frozen items such as frozen peas works well as the conform nicely to the are that needs to be iced.  USE AN ACE WRAP OR TED HOSE FOR SWELLING CONTROL  In addition to icing and elevation, Ace wraps or TED hose are used to help limit and resolve swelling.  It is recommended to use Ace wraps or TED hose until you are informed to stop.    When using Ace Wraps start the wrapping distally (farthest away from the body) and wrap proximally (closer to the body)   Example: If you had surgery on your leg and you do not have a splint on, start the ace wrap at the toes and work your way up to the thigh        If you  had surgery on your upper extremity and do not have a splint on, start the ace wrap at your fingers and work your way up to the upper arm  IF YOU ARE IN A SPLINT OR CAST DO NOT REMOVE IT FOR ANY REASON   If your splint gets wet for any reason please contact the office immediately. You may shower in your splint or cast as long as you keep it dry.  This can be done by wrapping in a cast cover or garbage back (or similar)  Do Not stick any thing down your splint or cast such as pencils, money, or hangers to try and scratch yourself with.  If you feel itchy take benadryl as prescribed on the bottle for itching  IF YOU ARE IN A CAM BOOT (BLACK BOOT)  You may remove boot periodically. Perform daily dressing changes as noted below.  Wash the liner of the boot regularly and wear a sock when wearing the boot. It is recommended that you sleep in the boot until told otherwise    Call office for the following: Temperature greater than 101F Persistent nausea and vomiting Severe uncontrolled pain Redness, tenderness, or signs of infection (pain, swelling, redness, odor or green/yellow discharge around the site) Difficulty breathing,  headache or visual disturbances Hives Persistent dizziness or light-headedness Extreme fatigue Any other questions or concerns you may have after discharge  In an emergency, call 911 or go to an Emergency Department at a nearby hospital  HELPFUL INFORMATION  If you had a block, it will wear off between 8-24 hrs postop typically.  This is period when your pain may go from nearly zero to the pain you would have had postop without the block.  This is an abrupt transition but nothing dangerous is happening.  You may take an extra dose of narcotic when this happens.  You should wean off your narcotic medicines as soon as you are able.  Most patients will be off or using minimal narcotics before their first postop appointment.   We suggest you use the pain medication the first  night prior to going to bed, in order to ease any pain when the anesthesia wears off. You should avoid taking pain medications on an empty stomach as it will make you nauseous.  Do not drink alcoholic beverages or take illicit drugs when taking pain medications.  In most states it is against the law to drive while you are in a splint or sling.  And certainly against the law to drive while taking narcotics.  You may return to work/school in the next couple of days when you feel up to it.   Pain medication may make you constipated.  Below are a few solutions to try in this order: Decrease the amount of pain medication if you aren't having pain. Drink lots of decaffeinated fluids. Drink prune juice and/or each dried prunes  If the first 3 don't work start with additional solutions Take Colace - an over-the-counter stool softener Take Senokot - an over-the-counter laxative Take Miralax - a stronger over-the-counter laxative     CALL THE OFFICE WITH ANY QUESTIONS OR CONCERNS: [phone]   VISIT OUR WEBSITE FOR ADDITIONAL INFORMATION: [email or web address]

## 2023-11-26 NOTE — Progress Notes (Deleted)
 Patient became nauseous  when eating breakfast, had small amount of emesis. Prn compazine. Dr. Juvenal notified.

## 2023-11-26 NOTE — TOC Progression Note (Addendum)
 Transition of Care (TOC) - Progression Note   Patient for discharge home today. Declining SNF.   NCM spoke to patinet's son [name] and confirmed plan for home with HHPT with [facility].   [name] has not heard fro [employer] regarding DME. Rolling walker, wheelchair and sliding board ordered. [name] states patient already has a drop arm bedside commode.   NCM called [employer] regarding DME, and discharge today. [employer] will deliver DME to patient's bedside. [facility] has [facility].   PT note states patient cannot travel home by car. [name] aware , but states he can safely transport his mom home by car.     Secure chatted nurse and MD   Updated [name] with [phone]   Patient refused walker and wheelchair , took sliding board  Patient Details  Name: [name] MRN: [id number] Date of Birth: 03-Nov-1950  Transition [facility]) CM/SW Contact  Jary Louvier, Powell Jansky, RN Phone Number: 11/26/2023, 9:27 AM  Clinical Narrative:       Expected Discharge Plan: Home w Home Health Services Barriers to Discharge: Continued Medical Work up               Expected Discharge Plan and Services       Living arrangements for the past 2 months: Single Family Home                                       Social Drivers of Health (SDOH) Interventions SDOH Screenings   Food Insecurity: Patient Unable To Answer (11/15/2023)  Housing: Patient Declined (11/15/2023)  Transportation Needs: Patient Declined (11/15/2023)  Utilities: Patient Declined (11/15/2023)  Social Connections: Patient Declined (11/15/2023)  Tobacco Use: High Risk (11/24/2023)    Readmission Risk Interventions     No data to display

## 2023-11-26 NOTE — Plan of Care (Signed)
   Problem: Education: Goal: Knowledge of General Education information will improve Description Including pain rating scale, medication(s)/side effects and non-pharmacologic comfort measures Outcome: Progressing

## 2023-11-26 NOTE — Discharge Summary (Signed)
 Physician Discharge Summary  [name] FMW:[id number] DOB: 08/15/1950 DOA: 11/14/2023  PCP: Christine Leta NOVAK, MD  Admit date: 11/14/2023 Discharge date: 11/26/2023  Admitted From:  Discharge disposition: Home   Recommendations for Outpatient Follow-Up:   CBC, BMP 1 week Holding blood pressure meds for lower end of blood pressure-family to monitor blood pressure at home and resume if needed  Nonweightbearing right lower extremity for 6 to 8 weeks Will likely leave splint on for 3 weeks and then remove her tibiotalocalcaneal pins  May place her in a short leg cast after this versus removable cam boot.  This depends on how much healing is present at the time of pin removal.  Her bone quality is very poor   Discharge Diagnosis:   Principal Problem:   Closed right ankle fracture Active Problems:   Closed displaced trimalleolar fracture of right ankle   Ankle dislocation, right, initial encounter   Osteoporosis   Vitamin D  deficiency    Discharge Condition: Improved.  Diet recommendation:  Regular.  Wound care: None.  Code status: Full.   History of Present Illness:   [name] is a 73 y.o. female with medical history significant of COPD (on 3L [place]), HTN, HLD, DM2, and hypothyroidism p/w GLF c/b R ankle fracture.    The patient presented after experiencing a fall at 0330 this morning while attempting to use the bathroom. Per her daughter, the patient woke her up to use the restroom (she sleeps in another bed nearby in the same room), and when she went to assist her mom to the bedside commode, her mom slumped down (did not have LOC or hit her head), and had immediate R ankle pain; as such, she was unable to get up and EMS was called and pt BIBA to ED.    In the ED, the pt hypertensive, tachypneic on home 3L [place], and febrile 101.1 x1. Labs notable for K 5.3, and WBC 15.8. UA positive for pyuria and bacteruria. R ankle XR showed trimalleolar fracture  dislocation of the ankle with mild displacement/deformity of the fracture fragments and mild lateral dislocation of the talus in relation to the tibial plafond. EDP consulted orthopedic surgery who plan for OR surgical repair today and requested medicine admission.   Hospital Course by Problem:   R ankle fracture  Fragility fracture: -Status post closed reduction 11/14/2023.  -Status post surgical repair on 11/24/2023. -Continue pain control as needed and bowel regimen -PT/OT-family would like to take home-only DME equipment they need currently is a walker-spoke with son who knows she is nonweightbearing on the right foot - Will need orthopedic follow-up for removal of pins   CAD s/p CABG HFpEF -PTA ASA 81mg  daily to begin 10/19, atorvastatin  80mg  daily, bisoprolol  5mg  daily No reported anginal symptoms.   COPD/chronic respiratory failure, on 3 L nasal cannula, at baseline. Stable.   HTN - Blood pressure is lower end of normal, blood pressure medications have been held-defer to PCP to resume   DM2 - Resume home meds   Hypothyroid -PTA Synthroid 50 mcg daily   Tobacco abuse Smokes 1 pack/day Not interested in quitting   Hyperkalemia - Solved after stopping lisinopril    AKI BMP 1 week-- suspect from lisinopril /hypotension -encourage Po intake -good UOP  Anemia - Started oral iron - Outpatient follow-up   Medical Consultants:      Discharge Exam:   Vitals:   11/26/23 0818 11/26/23 0840  BP: (!) 116/45   Pulse:  60 60  Resp: 17 18  Temp: (!) 97.4 F (36.3 C)   SpO2: 94%    Vitals:   11/25/23 2138 11/26/23 0508 11/26/23 0818 11/26/23 0840  BP: (!) 139/50 (!) 131/40 (!) 116/45   Pulse: 64 65 60 60  Resp:   17 18  Temp: 97.9 F (36.6 C) 97.9 F (36.6 C) (!) 97.4 F (36.3 C)   TempSrc: Oral Oral Oral   SpO2: 93% 95% 94%   Weight:      Height:        General exam: Appears calm and comfortable.     The results of significant diagnostics from  this hospitalization (including imaging, microbiology, ancillary and laboratory) are listed below for reference.     Procedures and Diagnostic Studies:   CT ANKLE RIGHT WO CONTRAST Result Date: 11/16/2023 CLINICAL DATA:  Ankle trauma, fracture, xray done (Age >= 5y) Closed right ankle fracture sustained in fall. EXAM: CT OF THE RIGHT ANKLE WITHOUT CONTRAST TECHNIQUE: Multidetector CT imaging of the right ankle was performed according to the standard protocol. Multiplanar CT image reconstructions were also generated. RADIATION DOSE REDUCTION: This exam was performed according to the departmental dose-optimization program which includes automated exposure control, adjustment of the mA and/or kV according to patient size and/or use of iterative reconstruction technique. COMPARISON:  Radiographs 11/14/2023. FINDINGS: Bones/Joint/Cartilage The ankle is splinted. The bones are diffusely demineralized. Trimalleolar fracture demonstrates improved alignment compared with the original radiographs. Fracture of the distal fibular metaphysis is associated with a small mildly displaced avulsion fracture anteriorly. Posterior malleolar fracture is minimally displaced. Fracture through the base of the medial malleolus demonstrates no significant displacement along the articular surface. There is mild displacement and comminution peripherally. No residual tibiotalar subluxation. The talar dome appears intact. Minimal ankle joint effusion. The subtalar joint appears normal. The additional tarsal bones appear normal. No acute findings are seen within the forefoot. There are mild degenerative changes at the 1st metatarsophalangeal joint. Ligaments Suboptimally assessed by CT. Muscles and Tendons As evaluated by CT, the ankle tendons appear intact without significant tenosynovitis. Generalized muscular atrophy without apparent focal abnormality. Soft tissues Moderate subcutaneous edema surrounding the ankle with extension into  the dorsal aspect of the foot. No organized fluid collection, foreign body or soft tissue emphysema identified. IMPRESSION: 1. Trimalleolar fracture with improved alignment compared with the original radiographs. 2. No residual tibiotalar subluxation. 3. No acute findings within the forefoot. 4. Moderate subcutaneous edema surrounding the ankle with extension into the dorsal aspect of the foot. No organized fluid collection, foreign body or soft tissue emphysema identified. Electronically Signed   By: Elsie Perone M.D.   On: 11/16/2023 15:42   DG Ankle Complete Right Result Date: 11/14/2023 CLINICAL DATA:  Fracture, mechanical fall. EXAM: RIGHT ANKLE - COMPLETE 3+ VIEW COMPARISON:  Radiographs earlier today FINDINGS: Improved alignment of distal fibular and medial malleolar fractures. The posterior malleolar fracture is obscured due to overlying cast/splint material. Improved mortise alignment. Overlying splint/cast material limits osseous and soft tissue fine detail. IMPRESSION: Improved alignment of distal fibular and medial malleolar fractures. Improved mortise alignment. Posterior malleolar fracture is not well demonstrated on provided views. Electronically Signed   By: Andrea Gasman M.D.   On: 11/14/2023 18:33   DG Ankle Complete Right Result Date: 11/14/2023 CLINICAL DATA:  Elective surgery, closed reduction of right ankle. EXAM: RIGHT ANKLE - COMPLETE 3+ VIEW COMPARISON:  Radiograph earlier today FINDINGS: Two fluoroscopic spot views of the right ankle submitted from the operating  room. Improved ankle alignment. Overlying splint material limits osseous and soft tissue fine detail. The known distal fibular and tibial fractures are not well demonstrated on provided views. Fluoroscopy time 8.8 seconds. Dose 0.4 mGy. IMPRESSION: Intraoperative fluoroscopy during closed reduction of right ankle fracture. Electronically Signed   By: Andrea Gasman M.D.   On: 11/14/2023 16:49   DG C-Arm 1-60 Min-No  Report Result Date: 11/14/2023 Fluoroscopy was utilized by the requesting physician.  No radiographic interpretation.   DG Ankle Right Port Result Date: 11/14/2023 EXAM: 1 or 2 VIEW(S) XRAY OF THE RIGHT ANKLE 11/14/2023 05:43:22 AM CLINICAL HISTORY: [id number] Ankle pain [id number]. Ankle pain/ post reduction Ankle pain/ post reduction COMPARISON: Right ankle series dated 11/14/2023. FINDINGS: BONES AND JOINTS: Trimalleolar fracture dislocation of the ankle joint is again demonstrated without significant change in the degree of displacement and angulation. SOFT TISSUES: A splint has been placed over the patient's ankle and foot. IMPRESSION: 1. Trimalleolar fracture dislocation of the right ankle joint without significant change in the degree of displacement and angulation compared to the prior study. Electronically signed by: Evalene Coho MD 11/14/2023 05:48 AM EDT RP Workstation: [id number]   DG Ankle Complete Right Result Date: 11/14/2023 EXAM: 3 OR MORE VIEW(S) XRAY OF THE RIGHT ANKLE 11/14/2023 04:29:15 AM CLINICAL HISTORY: fever, fall, ?ankle fracture. Pt arrives from home w/ c/o mechanical fall. Denies hitting head, denies thinners. C/o rt ankle pain, obvious deformity. Arrives in c-collar. COMPARISON: None available. FINDINGS: BONES AND JOINTS: Trimalleolar fracture dislocation of the ankle, with mild displacement/deformity of the fracture fragments and mild lateral dislocation of the talus in relation to the tibial plafond. Transverse fracture of the medial malleolus, oblique fracture of the lateral malleolus and mildly displaced fracture of the posterior malleolus. Lateral and posterior subluxation of the talar dome in relation to the tibial plafond. SOFT TISSUES: Diffuse moderate circumferential soft tissue swelling about the ankle. IMPRESSION: 1. Trimalleolar fracture dislocation of the ankle with mild displacement/deformity of the fracture fragments and mild lateral dislocation of the talus in  relation to the tibial plafond. 2. Lateral and posterior subluxation of the talar dome in relation to the tibial plafond. 3. Diffuse moderate circumferential soft tissue swelling about the ankle. Electronically signed by: Evalene Coho MD 11/14/2023 05:21 AM EDT RP Workstation: [id number]   DG Chest Port 1 View Result Date: 11/14/2023 EXAM: 1 VIEW(S) XRAY OF THE CHEST 11/14/2023 04:28:56 AM COMPARISON: PA and lateral radiographs of the chest dated 02/24/2013. CLINICAL HISTORY: fever, fall, ?ankle fracture. Pt arrives from home w/ c/o mechanical fall. Denies hitting head, denies thinners. C/o rt ankle pain, obvious deformity. Arrives in c-collar. FINDINGS: LUNGS AND PLEURA: Hyperinflation. Chronic coarsened interstitial markings without pulmonary edema. Right apical pleural/pulmonary scarring. No focal pulmonary opacity. No pleural effusion. No pneumothorax. HEART AND MEDIASTINUM: Surgical changes in mediastinum. Aortic calcification. Intact sternotomy wires. No acute abnormality of the cardiac and mediastinal silhouettes. BONES AND SOFT TISSUES: No acute osseous abnormality. IMPRESSION: 1. No acute process. Electronically signed by: Evalene Coho MD 11/14/2023 05:20 AM EDT RP Workstation: [id number]     Labs:   Basic Metabolic Panel: Recent Labs  Lab 11/21/23 0605 11/24/23 0252 11/24/23 0252 11/25/23 0500 11/26/23 0408  NA  --  139  --  135 136  K  --  5.3*   < > 5.6* 4.4  CL  --  100  --  99 100  CO2  --  29  --  27 24  GLUCOSE  --  160*  --  150* 143*  BUN  --  15  --  15 18  CREATININE 1.12* 1.17*  --  1.25* 1.42*  CALCIUM   --  9.0  --  8.6* 8.2*  MG  --   --   --  1.7  --    < > = values in this interval not displayed.   GFR Estimated Creatinine Clearance: 30.5 mL/min (A) (by C-G formula based on SCr of 1.42 mg/dL (H)). Liver Function Tests: No results for input(s): AST, ALT, ALKPHOS, BILITOT, PROT, ALBUMIN  in the last 168 hours. No results for input(s):  LIPASE, AMYLASE in the last 168 hours. No results for input(s): AMMONIA in the last 168 hours. Coagulation profile No results for input(s): INR, PROTIME in the last 168 hours.  CBC: Recent Labs  Lab 11/24/23 0252 11/25/23 0500  WBC 8.8 12.1*  HGB 8.9* 8.3*  HCT 28.9* 26.4*  MCV 88.9 88.6  PLT 571* 539*   Cardiac Enzymes: No results for input(s): CKTOTAL, CKMB, CKMBINDEX, TROPONINI in the last 168 hours. BNP: Invalid input(s): POCBNP CBG: Recent Labs  Lab 11/25/23 1200 11/25/23 1648 11/25/23 1939 11/26/23 0817 11/26/23 1200  GLUCAP 120* 204* 112* 193* 95   D-Dimer No results for input(s): DDIMER in the last 72 hours. Hgb A1c No results for input(s): HGBA1C in the last 72 hours. Lipid Profile No results for input(s): CHOL, HDL, LDLCALC, TRIG, CHOLHDL, LDLDIRECT in the last 72 hours. Thyroid  function studies No results for input(s): TSH, T4TOTAL, T3FREE, THYROIDAB in the last 72 hours.  Invalid input(s): FREET3 Anemia work up No results for input(s): VITAMINB12, FOLATE, FERRITIN, TIBC, IRON, RETICCTPCT in the last 72 hours. Microbiology Recent Results (from the past 240 hours)  Surgical pcr screen     Status: None   Collection Time: 11/24/23  7:36 AM   Specimen: Nasal Mucosa; Nasal Swab  Result Value Ref Range Status   MRSA, PCR NEGATIVE NEGATIVE Final   Staphylococcus aureus NEGATIVE NEGATIVE Final    Comment: (NOTE) The Xpert SA Assay (FDA approved for NASAL specimens in patients 106 years of age and older), is one component of a comprehensive surveillance program. It is not intended to diagnose infection nor to guide or monitor treatment. Performed at [facility], [address] [place], [place] [address]      Discharge Instructions:   Discharge Instructions     Diet general   Complete by: As directed    Discharge instructions   Complete by: As directed    Home health   Nonweightbearing right lower extremity for 6 to 8 weeks     Ortho to  likely leave splint on for 3 weeks and then remove her tibiotalocalcaneal pins and at that time place her in a short leg cast after this versus removable cam boot.  This depends on how much healing is present at the time of pin removal.  PCP 1 week-- CBC/BMP at that time   Increase activity slowly   Complete by: As directed    No wound care   Complete by: As directed       Allergies as of 11/26/2023       Reactions   Black Pepper [piper] Hives, Itching, Rash   Other Hives, Itching, Rash, Other (See Comments)   METALS   Tape Hives, Itching, Rash   Morphine  Other (See Comments)   hallucinations   Latex Itching, Rash   OR nurse notified @ [phone]        Medication List  PAUSE taking these medications    amLODipine  5 MG tablet Wait to take this until your doctor or other care provider tells you to start again. Commonly known as: NORVASC  Take 1 tablet by mouth once daily   lisinopril  20 MG tablet Wait to take this until your doctor or other care provider tells you to start again. Commonly known as: ZESTRIL  Take 20 mg by mouth daily.       STOP taking these medications    HYDROcodone -acetaminophen  7.5-325 MG tablet Commonly known as: NORCO Replaced by: HYDROcodone -acetaminophen  10-325 MG tablet       TAKE these medications    albuterol  108 (90 Base) MCG/ACT inhaler Commonly known as: VENTOLIN  HFA Inhale 2 puffs into the lungs every 6 (six) hours as needed for wheezing or shortness of breath.   Anoro Ellipta  62.5-25 MCG/INH Aepb Generic drug: umeclidinium-vilanterol Inhale 1 puff into the lungs daily.   apixaban 2.5 MG Tabs tablet Commonly known as: Eliquis Take 1 tablet (2.5 mg total) by mouth 2 (two) times daily.   aspirin  EC 81 MG tablet Take 81 mg by mouth daily.   atorvastatin  80 MG tablet Commonly known as: LIPITOR  Take 80 mg by mouth daily.   bisoprolol  5 MG tablet Commonly  known as: ZEBETA  Take 1 tablet by mouth once daily   DULoxetine  30 MG capsule Commonly known as: CYMBALTA  Take 30 mg by mouth daily.   gabapentin 300 MG capsule Commonly known as: NEURONTIN Take 300 mg by mouth 3 (three) times daily. What changed: Another medication with the same name was removed. Continue taking this medication, and follow the directions you see here.   glipiZIDE 5 MG 24 hr tablet Commonly known as: GLUCOTROL XL Take 5 mg by mouth 2 (two) times daily.   HYDROcodone -acetaminophen  10-325 MG tablet Commonly known as: NORCO Take 1 tablet by mouth every 4 (four) hours as needed for moderate pain (pain score 4-6) or severe pain (pain score 7-10). Replaces: HYDROcodone -acetaminophen  7.5-325 MG tablet   ipratropium-albuterol  0.5-2.5 (3) MG/3ML Soln Commonly known as: DUONEB Take 3 mLs by nebulization every 6 (six) hours as needed (for shortness of breath).   iron polysaccharides 150 MG capsule Commonly known as: NIFEREX Take 1 capsule (150 mg total) by mouth daily. Start taking on: November 27, 2023   levothyroxine 50 MCG tablet Commonly known as: SYNTHROID Take 50 mcg by mouth daily.   metFORMIN  500 MG tablet Commonly known as: GLUCOPHAGE  Take 500 mg by mouth daily with breakfast.   methocarbamol 500 MG tablet Commonly known as: ROBAXIN Take 1 tablet (500 mg total) by mouth every 8 (eight) hours as needed for muscle spasms.   ondansetron  4 MG tablet Commonly known as: ZOFRAN  Take 4 mg by mouth 4 (four) times daily as needed for vomiting or nausea.   polyethylene glycol 17 g packet Commonly known as: MIRALAX / GLYCOLAX Take 17 g by mouth daily as needed for mild constipation.   senna-docusate 8.6-50 MG tablet Commonly known as: Senokot-S Take 1 tablet by mouth at bedtime.   Vitamin D  (Ergocalciferol ) 1.25 MG (50000 UNIT) Caps capsule Commonly known as: DRISDOL  Take 1 capsule (50,000 Units total) by mouth every 7 (seven) days. Start taking on: November 29, 2023               Durable Medical Equipment  (From admission, onward)           Start     Ordered   11/26/23 9081  For home use only  DME Other see comment  Once       Comments: Sliding board  Question:  Length of Need  Answer:  Lifetime   11/26/23 0918   11/18/23 1516  For home use only DME standard manual wheelchair with seat cushion  Once       Comments: Patient suffers from Right trimalleolar ankle fracture dislocation s/p  closed reduction in OR, strict non weight bearing to right leg  which impairs their ability to perform daily activities like ambulating  in the home.  A cane  will not resolve issue with performing activities of daily living. A wheelchair will allow patient to safely perform daily activities. Patient can safely propel the wheelchair in the home or has a caregiver who can provide assistance. Length of need lifetime . Accessories: elevating leg rests (ELRs), wheel locks, extensions and anti-tippers.  Seat and back cushions   11/18/23 1516   11/16/23 1521  For home use only DME Walker rolling  Once       Question Answer Comment  Walker: With 5 Inch Wheels   Patient needs a walker to treat with the following condition Weakness      11/16/23 1521            Contact information for follow-up providers     Celena Sharper, MD. Schedule an appointment as soon as possible for a visit in 2 week(s).   Specialty: Orthopedic Surgery Contact information: [address] [place] [address] [phone]              Contact information for after-discharge care     Durable Medical Equipment     [facility] (DME) Follow up.   Service: Durable Medical Equipment Why: Equipment was ordered through [employer] information: [address] [employer] [place]  [phone]             Home Medical Care     [facility]) Follow up.   Service: Home Health  Services Contact information: [address] [place]  [address] [phone]                      Time coordinating discharge: 45 min  Signed:  Harlene RAYMOND Bowl DO  [facility] 11/26/2023, 12:38 PM

## 2023-11-26 NOTE — Progress Notes (Signed)
 Physical Therapy Treatment Patient Details Name: [name] MRN: [id number] DOB: 11-Dec-1950 Today's Date: 11/26/2023   History of Present Illness [name] is a 73 y.o. female presents with a ground level fall, resulting in R ankle fracture and L ankle hyperplantarflexion injury; now s/p closed reduction in OR on 10/18. R ankle ORIF 10/28. PMH:  COPD (on 3L [place]), HTN, HLD, DM2, and hypothyroidism    PT Comments  Pt resting in bed on arrival, agreeable to session and demonstrating steady progress towards acute goals. Pt continues to be limited in safe mobility by LLE pain, limiting weight bearing and standing tolerance, poor balance/postural reactions, and weakness. Pt continues to require min A to boost to standing with max cues to maintain NWB on R on rise to stand. Pt unable to hop on L due to pain, despite pre-medication and air-cast being donned. Pt able to stand pivot on L to chair with min A to maintain balance. Pt up in chair at end of session with RLE elevated. Pt continues to benefit from skilled PT services to progress toward functional mobility goals.     If plan is discharge home, recommend the following: A lot of help with bathing/dressing/bathroom;Assistance with cooking/housework;Assist for transportation;Help with stairs or ramp for entrance;Supervision due to cognitive status;Two people to help with walking and/or transfers   Can travel by private vehicle     No  Equipment Recommendations  Other (comment) (sliding board)    Recommendations for Other Services       Precautions / Restrictions Precautions Precautions: Fall Recall of Precautions/Restrictions: Intact Precaution/Restrictions Comments: Cues to adhere to weight-bearing precautions throughout functional mobility. Restrictions Weight Bearing Restrictions Per Provider Order: Yes RLE Weight Bearing Per Provider Order: Non weight bearing     Mobility  Bed Mobility Overal bed mobility: Needs  Assistance Bed Mobility: Supine to Sit     Supine to sit: Supervision, HOB elevated, Used rails     General bed mobility comments: Pt able to manage RLE for bed mobility, increased time needed    Transfers Overall transfer level: Needs assistance Equipment used: Rolling walker (2 wheels) Transfers: Bed to chair/wheelchair/BSC, Sit to/from Stand Sit to Stand: Min assist Stand pivot transfers: Min assist         General transfer comment: min A to boost to stand with RLE placed over this PTAs foot to monitor for NWB, pt pressing down on rise, but able to lift once in standing to maitnain NWB, min A to maintain balance to pivot to chair, pt attempting to hop x1 with poor clearance due to LLE pain    Ambulation/Gait               General Gait Details: unable to hop on LLE   Stairs             Wheelchair Mobility     Tilt Bed    Modified Rankin (Stroke Patients Only)       Balance Overall balance assessment: Needs assistance Sitting-balance support: Bilateral upper extremity supported, Feet supported Sitting balance-Leahy Scale: Good Sitting balance - Comments: on EOB   Standing balance support: Bilateral upper extremity supported, During functional activity, Reliant on assistive device for balance Standing balance-Leahy Scale: Poor Standing balance comment: needs BUE support as well as mod A to stand, unable to stand to maintain WB precautions  Communication Communication Communication: No apparent difficulties  Cognition Arousal: Alert Behavior During Therapy: WFL for tasks assessed/performed   PT - Cognitive impairments: No apparent impairments                         Following commands: Intact Following commands impaired: Follows multi-step commands with increased time    Cueing Cueing Techniques: Verbal cues, Tactile cues, Visual cues  Exercises      General Comments General comments (skin  integrity, edema, etc.): VSS on 3L      Pertinent Vitals/Pain Pain Assessment Pain Assessment: Faces Faces Pain Scale: Hurts even more Pain Location: RLE>LLE Pain Descriptors / Indicators: Discomfort, Grimacing, Guarding Pain Intervention(s): Premedicated before session, Monitored during session, Limited activity within patient's tolerance    Home Living                          Prior Function            PT Goals (current goals can now be found in the care plan section) Acute Rehab PT Goals Patient Stated Goal: Go Home PT Goal Formulation: With patient Time For Goal Achievement: 11/29/23 Progress towards PT goals: Progressing toward goals    Frequency    Min 2X/week      PT Plan      Co-evaluation              AM-PAC PT 6 Clicks Mobility   Outcome Measure  Help needed turning from your back to your side while in a flat bed without using bedrails?: A Little Help needed moving from lying on your back to sitting on the side of a flat bed without using bedrails?: A Little Help needed moving to and from a bed to a chair (including a wheelchair)?: A Little Help needed standing up from a chair using your arms (e.g., wheelchair or bedside chair)?: A Lot Help needed to walk in hospital room?: Total Help needed climbing 3-5 steps with a railing? : Total 6 Click Score: 13    End of Session Equipment Utilized During Treatment: Gait belt;Oxygen  Activity Tolerance: Patient limited by pain Patient left: with call bell/phone within reach;in chair;Other (comment) (with RLE eleavted on x2 pillows) Nurse Communication: Mobility status PT Visit Diagnosis: Pain;Other abnormalities of gait and mobility (R26.89);History of falling (Z91.81);Muscle weakness (generalized) (M62.81);Difficulty in walking, not elsewhere classified (R26.2) Pain - Right/Left: Right Pain - part of body: Leg     Time: 1131-1150 PT Time Calculation (min) (ACUTE ONLY): 19 min  Charges:     $Therapeutic Activity: 8-22 mins PT General Charges $$ ACUTE PT VISIT: 1 Visit                     [name] R. PTA Acute Rehabilitation Services Office: [phone]   [name] 11/26/2023, 1:01 PM

## 2023-12-09 DIAGNOSIS — S82851D Displaced trimalleolar fracture of right lower leg, subsequent encounter for closed fracture with routine healing: Secondary | ICD-10-CM | POA: Diagnosis not present

## 2023-12-09 DIAGNOSIS — S9304XD Dislocation of right ankle joint, subsequent encounter: Secondary | ICD-10-CM | POA: Diagnosis not present

## 2023-12-15 DIAGNOSIS — J9621 Acute and chronic respiratory failure with hypoxia: Secondary | ICD-10-CM | POA: Diagnosis not present

## 2023-12-15 DIAGNOSIS — J441 Chronic obstructive pulmonary disease with (acute) exacerbation: Secondary | ICD-10-CM | POA: Diagnosis not present

## 2023-12-21 DIAGNOSIS — S82851D Displaced trimalleolar fracture of right lower leg, subsequent encounter for closed fracture with routine healing: Secondary | ICD-10-CM | POA: Diagnosis not present

## 2023-12-21 DIAGNOSIS — S9304XD Dislocation of right ankle joint, subsequent encounter: Secondary | ICD-10-CM | POA: Diagnosis not present

## 2023-12-24 DIAGNOSIS — J9621 Acute and chronic respiratory failure with hypoxia: Secondary | ICD-10-CM | POA: Diagnosis not present

## 2023-12-24 DIAGNOSIS — J441 Chronic obstructive pulmonary disease with (acute) exacerbation: Secondary | ICD-10-CM | POA: Diagnosis not present

## 2023-12-25 DIAGNOSIS — R0600 Dyspnea, unspecified: Secondary | ICD-10-CM | POA: Diagnosis not present

## 2023-12-25 DIAGNOSIS — R062 Wheezing: Secondary | ICD-10-CM | POA: Diagnosis not present

## 2023-12-25 DIAGNOSIS — Z720 Tobacco use: Secondary | ICD-10-CM | POA: Diagnosis not present

## 2023-12-25 DIAGNOSIS — E119 Type 2 diabetes mellitus without complications: Secondary | ICD-10-CM | POA: Diagnosis not present

## 2023-12-25 DIAGNOSIS — R918 Other nonspecific abnormal finding of lung field: Secondary | ICD-10-CM | POA: Diagnosis not present

## 2023-12-25 DIAGNOSIS — Z79899 Other long term (current) drug therapy: Secondary | ICD-10-CM | POA: Diagnosis not present

## 2023-12-25 DIAGNOSIS — I10 Essential (primary) hypertension: Secondary | ICD-10-CM | POA: Diagnosis not present

## 2023-12-25 DIAGNOSIS — R0602 Shortness of breath: Secondary | ICD-10-CM | POA: Diagnosis not present

## 2023-12-25 DIAGNOSIS — R059 Cough, unspecified: Secondary | ICD-10-CM | POA: Diagnosis not present

## 2023-12-25 DIAGNOSIS — J441 Chronic obstructive pulmonary disease with (acute) exacerbation: Secondary | ICD-10-CM | POA: Diagnosis not present

## 2023-12-25 DIAGNOSIS — Z20822 Contact with and (suspected) exposure to covid-19: Secondary | ICD-10-CM | POA: Diagnosis not present

## 2023-12-25 DIAGNOSIS — I959 Hypotension, unspecified: Secondary | ICD-10-CM | POA: Diagnosis not present

## 2023-12-25 DIAGNOSIS — Z7984 Long term (current) use of oral hypoglycemic drugs: Secondary | ICD-10-CM | POA: Diagnosis not present

## 2023-12-25 DIAGNOSIS — F17210 Nicotine dependence, cigarettes, uncomplicated: Secondary | ICD-10-CM | POA: Diagnosis not present

## 2023-12-25 DIAGNOSIS — J189 Pneumonia, unspecified organism: Secondary | ICD-10-CM | POA: Diagnosis not present

## 2023-12-25 DIAGNOSIS — R069 Unspecified abnormalities of breathing: Secondary | ICD-10-CM | POA: Diagnosis not present

## 2023-12-25 DIAGNOSIS — Z1152 Encounter for screening for COVID-19: Secondary | ICD-10-CM | POA: Diagnosis not present

## 2023-12-25 DIAGNOSIS — J90 Pleural effusion, not elsewhere classified: Secondary | ICD-10-CM | POA: Diagnosis not present

## 2024-01-28 DEATH — deceased
